# Patient Record
Sex: Male | Born: 1935 | Race: White | Hispanic: No | Marital: Married | Smoking: Former smoker
Health system: Southern US, Community
[De-identification: ages and names within clinical notes are randomized; demographics above are authoritative.]

## PROBLEM LIST (undated history)

## (undated) DIAGNOSIS — M810 Age-related osteoporosis without current pathological fracture: Secondary | ICD-10-CM

## (undated) DIAGNOSIS — I739 Peripheral vascular disease, unspecified: Secondary | ICD-10-CM

## (undated) DIAGNOSIS — K8020 Calculus of gallbladder without cholecystitis without obstruction: Secondary | ICD-10-CM

## (undated) DIAGNOSIS — J189 Pneumonia, unspecified organism: Secondary | ICD-10-CM

## (undated) DIAGNOSIS — I10 Essential (primary) hypertension: Secondary | ICD-10-CM

## (undated) DIAGNOSIS — C329 Malignant neoplasm of larynx, unspecified: Secondary | ICD-10-CM

## (undated) DIAGNOSIS — K55069 Acute infarction of intestine, part and extent unspecified: Secondary | ICD-10-CM

## (undated) DIAGNOSIS — E7800 Pure hypercholesterolemia, unspecified: Secondary | ICD-10-CM

## (undated) DIAGNOSIS — G2581 Restless legs syndrome: Secondary | ICD-10-CM

## (undated) DIAGNOSIS — G454 Transient global amnesia: Secondary | ICD-10-CM

## (undated) DIAGNOSIS — C189 Malignant neoplasm of colon, unspecified: Secondary | ICD-10-CM

## (undated) DIAGNOSIS — IMO0001 Reserved for inherently not codable concepts without codable children: Secondary | ICD-10-CM

## (undated) DIAGNOSIS — M1990 Unspecified osteoarthritis, unspecified site: Secondary | ICD-10-CM

## (undated) DIAGNOSIS — K219 Gastro-esophageal reflux disease without esophagitis: Secondary | ICD-10-CM

## (undated) DIAGNOSIS — B029 Zoster without complications: Secondary | ICD-10-CM

## (undated) DIAGNOSIS — H409 Unspecified glaucoma: Secondary | ICD-10-CM

## (undated) DIAGNOSIS — D539 Nutritional anemia, unspecified: Secondary | ICD-10-CM

## (undated) DIAGNOSIS — I2510 Atherosclerotic heart disease of native coronary artery without angina pectoris: Secondary | ICD-10-CM

## (undated) DIAGNOSIS — F0390 Unspecified dementia without behavioral disturbance: Secondary | ICD-10-CM

## (undated) DIAGNOSIS — I639 Cerebral infarction, unspecified: Secondary | ICD-10-CM

## (undated) DIAGNOSIS — C4490 Unspecified malignant neoplasm of skin, unspecified: Secondary | ICD-10-CM

## (undated) DIAGNOSIS — H9190 Unspecified hearing loss, unspecified ear: Secondary | ICD-10-CM

## (undated) HISTORY — DX: Pneumonia, unspecified organism: J18.9

## (undated) HISTORY — PX: EXPLORATORY LAPAROTOMY: SUR591

## (undated) HISTORY — DX: Atherosclerotic heart disease of native coronary artery without angina pectoris: I25.10

## (undated) HISTORY — DX: Malignant neoplasm of larynx, unspecified: C32.9

## (undated) HISTORY — PX: COLECTOMY: SHX59

## (undated) HISTORY — DX: Gastro-esophageal reflux disease without esophagitis: K21.9

## (undated) HISTORY — PX: OTHER SURGICAL HISTORY: SHX169

## (undated) HISTORY — DX: Unspecified glaucoma: H40.9

## (undated) HISTORY — DX: Transient global amnesia: G45.4

## (undated) HISTORY — PX: BACK SURGERY: SHX140

## (undated) HISTORY — DX: Peripheral vascular disease, unspecified: I73.9

## (undated) HISTORY — DX: Pure hypercholesterolemia, unspecified: E78.00

## (undated) HISTORY — DX: Cerebral infarction, unspecified: I63.9

## (undated) HISTORY — DX: Zoster without complications: B02.9

## (undated) HISTORY — DX: Age-related osteoporosis without current pathological fracture: M81.0

## (undated) HISTORY — DX: Calculus of gallbladder without cholecystitis without obstruction: K80.20

## (undated) HISTORY — DX: Unspecified hearing loss, unspecified ear: H91.90

## (undated) HISTORY — DX: Acute infarction of intestine, part and extent unspecified: K55.069

## (undated) HISTORY — DX: Nutritional anemia, unspecified: D53.9

## (undated) HISTORY — DX: Malignant neoplasm of colon, unspecified: C18.9

## (undated) HISTORY — DX: Essential (primary) hypertension: I10

## (undated) HISTORY — PX: EYE SURGERY: SHX253

---

## 1998-02-28 ENCOUNTER — Ambulatory Visit: Admission: RE | Admit: 1998-02-28 | Discharge: 1998-02-28 | Payer: Self-pay | Admitting: Vascular Surgery

## 1998-04-11 ENCOUNTER — Ambulatory Visit: Admission: RE | Admit: 1998-04-11 | Discharge: 1998-04-11 | Payer: Self-pay | Admitting: Vascular Surgery

## 1999-02-18 ENCOUNTER — Ambulatory Visit (HOSPITAL_COMMUNITY): Admission: RE | Admit: 1999-02-18 | Discharge: 1999-02-18 | Payer: Self-pay | Admitting: Neurosurgery

## 1999-02-18 ENCOUNTER — Encounter: Payer: Self-pay | Admitting: Neurosurgery

## 1999-03-01 ENCOUNTER — Emergency Department (HOSPITAL_COMMUNITY): Admission: EM | Admit: 1999-03-01 | Discharge: 1999-03-01 | Payer: Self-pay | Admitting: Internal Medicine

## 1999-03-10 ENCOUNTER — Encounter: Payer: Self-pay | Admitting: Neurosurgery

## 1999-03-10 ENCOUNTER — Ambulatory Visit (HOSPITAL_COMMUNITY): Admission: RE | Admit: 1999-03-10 | Discharge: 1999-03-10 | Payer: Self-pay | Admitting: Neurosurgery

## 1999-03-25 ENCOUNTER — Ambulatory Visit (HOSPITAL_COMMUNITY): Admission: RE | Admit: 1999-03-25 | Discharge: 1999-03-25 | Payer: Self-pay | Admitting: Neurosurgery

## 1999-03-25 ENCOUNTER — Encounter: Payer: Self-pay | Admitting: Neurosurgery

## 1999-04-08 ENCOUNTER — Ambulatory Visit (HOSPITAL_COMMUNITY): Admission: RE | Admit: 1999-04-08 | Discharge: 1999-04-08 | Payer: Self-pay | Admitting: Neurosurgery

## 1999-04-08 ENCOUNTER — Encounter: Payer: Self-pay | Admitting: Neurosurgery

## 1999-07-23 ENCOUNTER — Encounter: Payer: Self-pay | Admitting: Neurosurgery

## 1999-07-23 ENCOUNTER — Ambulatory Visit (HOSPITAL_COMMUNITY): Admission: RE | Admit: 1999-07-23 | Discharge: 1999-07-23 | Payer: Self-pay | Admitting: Neurosurgery

## 2000-06-30 ENCOUNTER — Encounter: Admission: RE | Admit: 2000-06-30 | Discharge: 2000-06-30 | Payer: Self-pay | Admitting: Geriatric Medicine

## 2000-06-30 ENCOUNTER — Encounter: Payer: Self-pay | Admitting: Geriatric Medicine

## 2001-03-23 ENCOUNTER — Encounter: Payer: Self-pay | Admitting: Geriatric Medicine

## 2001-03-23 ENCOUNTER — Encounter: Admission: RE | Admit: 2001-03-23 | Discharge: 2001-03-23 | Payer: Self-pay | Admitting: Geriatric Medicine

## 2001-04-01 ENCOUNTER — Encounter: Admission: RE | Admit: 2001-04-01 | Discharge: 2001-04-01 | Payer: Self-pay | Admitting: Geriatric Medicine

## 2001-04-01 ENCOUNTER — Encounter: Payer: Self-pay | Admitting: Geriatric Medicine

## 2001-04-12 ENCOUNTER — Encounter: Payer: Self-pay | Admitting: Neurosurgery

## 2001-04-12 ENCOUNTER — Inpatient Hospital Stay (HOSPITAL_COMMUNITY): Admission: RE | Admit: 2001-04-12 | Discharge: 2001-04-13 | Payer: Self-pay | Admitting: Neurosurgery

## 2001-11-09 ENCOUNTER — Ambulatory Visit (HOSPITAL_COMMUNITY): Admission: RE | Admit: 2001-11-09 | Discharge: 2001-11-09 | Payer: Self-pay | Admitting: Geriatric Medicine

## 2002-03-21 ENCOUNTER — Ambulatory Visit (HOSPITAL_COMMUNITY): Admission: RE | Admit: 2002-03-21 | Discharge: 2002-03-21 | Payer: Self-pay | Admitting: Gastroenterology

## 2002-03-21 ENCOUNTER — Encounter (INDEPENDENT_AMBULATORY_CARE_PROVIDER_SITE_OTHER): Payer: Self-pay | Admitting: Specialist

## 2002-04-05 ENCOUNTER — Encounter (INDEPENDENT_AMBULATORY_CARE_PROVIDER_SITE_OTHER): Payer: Self-pay | Admitting: Specialist

## 2002-04-05 ENCOUNTER — Inpatient Hospital Stay (HOSPITAL_COMMUNITY): Admission: RE | Admit: 2002-04-05 | Discharge: 2002-04-10 | Payer: Self-pay | Admitting: General Surgery

## 2002-04-13 ENCOUNTER — Encounter: Payer: Self-pay | Admitting: Otolaryngology

## 2002-04-13 ENCOUNTER — Inpatient Hospital Stay (HOSPITAL_COMMUNITY): Admission: EM | Admit: 2002-04-13 | Discharge: 2002-04-16 | Payer: Self-pay | Admitting: Emergency Medicine

## 2002-04-17 ENCOUNTER — Encounter: Payer: Self-pay | Admitting: General Surgery

## 2002-04-17 ENCOUNTER — Inpatient Hospital Stay (HOSPITAL_COMMUNITY): Admission: AD | Admit: 2002-04-17 | Discharge: 2002-04-25 | Payer: Self-pay | Admitting: General Surgery

## 2002-12-07 ENCOUNTER — Ambulatory Visit (HOSPITAL_COMMUNITY): Admission: RE | Admit: 2002-12-07 | Discharge: 2002-12-07 | Payer: Self-pay | Admitting: Geriatric Medicine

## 2003-04-24 ENCOUNTER — Ambulatory Visit (HOSPITAL_COMMUNITY): Admission: RE | Admit: 2003-04-24 | Discharge: 2003-04-24 | Payer: Self-pay | Admitting: Gastroenterology

## 2003-04-24 ENCOUNTER — Encounter (INDEPENDENT_AMBULATORY_CARE_PROVIDER_SITE_OTHER): Payer: Self-pay | Admitting: Specialist

## 2003-07-20 ENCOUNTER — Encounter: Admission: RE | Admit: 2003-07-20 | Discharge: 2003-07-20 | Payer: Self-pay | Admitting: Geriatric Medicine

## 2003-07-20 ENCOUNTER — Encounter: Payer: Self-pay | Admitting: Geriatric Medicine

## 2006-04-07 ENCOUNTER — Encounter: Admission: RE | Admit: 2006-04-07 | Discharge: 2006-04-07 | Payer: Self-pay | Admitting: Geriatric Medicine

## 2006-04-12 ENCOUNTER — Encounter: Admission: RE | Admit: 2006-04-12 | Discharge: 2006-04-12 | Payer: Self-pay | Admitting: Geriatric Medicine

## 2006-04-16 ENCOUNTER — Encounter: Admission: RE | Admit: 2006-04-16 | Discharge: 2006-04-16 | Payer: Self-pay | Admitting: Geriatric Medicine

## 2007-09-29 DIAGNOSIS — B029 Zoster without complications: Secondary | ICD-10-CM

## 2007-09-29 HISTORY — DX: Zoster without complications: B02.9

## 2009-02-17 ENCOUNTER — Inpatient Hospital Stay (HOSPITAL_COMMUNITY): Admission: EM | Admit: 2009-02-17 | Discharge: 2009-02-19 | Payer: Self-pay | Admitting: Emergency Medicine

## 2009-02-18 ENCOUNTER — Encounter (INDEPENDENT_AMBULATORY_CARE_PROVIDER_SITE_OTHER): Payer: Self-pay | Admitting: Internal Medicine

## 2009-02-19 ENCOUNTER — Encounter (INDEPENDENT_AMBULATORY_CARE_PROVIDER_SITE_OTHER): Payer: Self-pay | Admitting: Gastroenterology

## 2009-03-04 ENCOUNTER — Inpatient Hospital Stay (HOSPITAL_BASED_OUTPATIENT_CLINIC_OR_DEPARTMENT_OTHER): Admission: RE | Admit: 2009-03-04 | Discharge: 2009-03-04 | Payer: Self-pay | Admitting: Interventional Cardiology

## 2011-01-06 LAB — CARDIAC PANEL(CRET KIN+CKTOT+MB+TROPI)
CK, MB: 1.9 ng/mL (ref 0.3–4.0)
CK, MB: 2 ng/mL (ref 0.3–4.0)
Relative Index: INVALID (ref 0.0–2.5)
Total CK: 62 U/L (ref 7–232)

## 2011-01-06 LAB — DIFFERENTIAL
Basophils Absolute: 0 K/uL (ref 0.0–0.1)
Basophils Relative: 0 % (ref 0–1)
Eosinophils Absolute: 0.1 K/uL (ref 0.0–0.7)
Eosinophils Relative: 1 % (ref 0–5)
Lymphocytes Relative: 15 % (ref 12–46)
Lymphs Abs: 0.8 K/uL (ref 0.7–4.0)
Monocytes Absolute: 0.1 K/uL (ref 0.1–1.0)
Monocytes Relative: 2 % — ABNORMAL LOW (ref 3–12)
Neutro Abs: 4.5 K/uL (ref 1.7–7.7)
Neutrophils Relative %: 82 % — ABNORMAL HIGH (ref 43–77)

## 2011-01-06 LAB — COMPREHENSIVE METABOLIC PANEL
ALT: 29 U/L (ref 0–53)
AST: 29 U/L (ref 0–37)
Albumin: 3.5 g/dL (ref 3.5–5.2)
Alkaline Phosphatase: 64 U/L (ref 39–117)
Glucose, Bld: 100 mg/dL — ABNORMAL HIGH (ref 70–99)
Potassium: 3.8 mEq/L (ref 3.5–5.1)
Sodium: 141 mEq/L (ref 135–145)
Total Protein: 5.9 g/dL — ABNORMAL LOW (ref 6.0–8.3)

## 2011-01-06 LAB — CBC
HCT: 35.4 % — ABNORMAL LOW (ref 39.0–52.0)
Hemoglobin: 12.3 g/dL — ABNORMAL LOW (ref 13.0–17.0)
Hemoglobin: 14.2 g/dL (ref 13.0–17.0)
MCHC: 34.3 g/dL (ref 30.0–36.0)
MCHC: 34.8 g/dL (ref 30.0–36.0)
MCV: 106.3 fL — ABNORMAL HIGH (ref 78.0–100.0)
MCV: 107.1 fL — ABNORMAL HIGH (ref 78.0–100.0)
Platelets: 176 K/uL (ref 150–400)
Platelets: 186 10*3/uL (ref 150–400)
Platelets: 206 10*3/uL (ref 150–400)
RBC: 3.33 MIL/uL — ABNORMAL LOW (ref 4.22–5.81)
RDW: 14.4 % (ref 11.5–15.5)
RDW: 14.4 % (ref 11.5–15.5)
WBC: 4 K/uL (ref 4.0–10.5)

## 2011-01-06 LAB — URINALYSIS, MICROSCOPIC ONLY
Bilirubin Urine: NEGATIVE
Nitrite: NEGATIVE
Protein, ur: NEGATIVE mg/dL
Specific Gravity, Urine: 1.01 (ref 1.005–1.030)
Urobilinogen, UA: 0.2 mg/dL (ref 0.0–1.0)

## 2011-01-06 LAB — BASIC METABOLIC PANEL WITH GFR
BUN: 13 mg/dL (ref 6–23)
CO2: 31 meq/L (ref 19–32)
Calcium: 8.8 mg/dL (ref 8.4–10.5)
Chloride: 106 meq/L (ref 96–112)
Creatinine, Ser: 0.76 mg/dL (ref 0.4–1.5)
GFR calc non Af Amer: >60 mL/min
Glucose, Bld: 106 mg/dL — ABNORMAL HIGH (ref 70–99)
Potassium: 3.4 meq/L — ABNORMAL LOW (ref 3.5–5.1)
Sodium: 143 meq/L (ref 135–145)

## 2011-01-06 LAB — CK TOTAL AND CKMB (NOT AT ARMC)
CK, MB: 2.6 ng/mL (ref 0.3–4.0)
Relative Index: INVALID (ref 0.0–2.5)
Total CK: 58 U/L (ref 7–232)

## 2011-01-06 LAB — POCT CARDIAC MARKERS
CKMB, poc: 1.3 ng/mL (ref 1.0–8.0)
CKMB, poc: 1.5 ng/mL (ref 1.0–8.0)
Myoglobin, poc: 99.5 ng/mL (ref 12–200)
Troponin i, poc: <0.05 ng/mL (ref 0.00–0.09)
Troponin i, poc: <0.05 ng/mL (ref 0.00–0.09)

## 2011-01-06 LAB — LIPID PANEL
HDL: 54 mg/dL
Total CHOL/HDL Ratio: 3.2 ratio
Triglycerides: 99 mg/dL
VLDL: 20 mg/dL (ref 0–40)

## 2011-01-06 LAB — MAGNESIUM: Magnesium: 2.6 mg/dL — ABNORMAL HIGH (ref 1.5–2.5)

## 2011-01-06 LAB — BASIC METABOLIC PANEL
CO2: 27 mEq/L (ref 19–32)
Chloride: 108 mEq/L (ref 96–112)
Creatinine, Ser: 0.78 mg/dL (ref 0.4–1.5)
GFR calc Af Amer: >60 mL/min (ref >60)
Potassium: 3.9 mEq/L (ref 3.5–5.1)
Sodium: 142 mEq/L (ref 135–145)

## 2011-01-06 LAB — BRAIN NATRIURETIC PEPTIDE: Pro B Natriuretic peptide (BNP): 33 pg/mL (ref 0.0–100.0)

## 2011-01-06 LAB — TSH: TSH: 2.987 u[IU]/mL (ref 0.350–4.500)

## 2011-01-06 LAB — URINE CULTURE

## 2011-01-06 LAB — TROPONIN I

## 2011-02-10 NOTE — Discharge Summary (Signed)
NAME:  Eric [name], Eric [name] NO.:  [id number]   MEDICAL RECORD NO.:  [id number]          PATIENT TYPE:  INP   LOCATION:  3741                         FACILITY:  [facility]   PHYSICIAN:  Ramiro Harvest, MD    DATE OF BIRTH:  11/27/1929   DATE OF ADMISSION:  02/17/2009  DATE OF DISCHARGE:  02/19/2009                               DISCHARGE SUMMARY   PRIMARY CARE PHYSICIAN:  Hal T. Pete Glatter, M.D. of [place].   DISCHARGE DIAGNOSES:  1. Chest pain.  2. Esophagitis.  3. Dilated common bile duct  4. Gastroesophageal reflux disease.  5. Hypokalemia.  6. Peripheral vascular disease.  7. History of a transient ischemic attack.  8. Hypertension.  9. Hyperlipidemia.  10.Status post femoral artery stenting per Dr. Arbie Cookey secondary to      claudication.  11.Status post lumbar laminectomies x2, one L5-S1 in 1997, the other      one, L2-L3 of herniated disk per Dr. Channing Mutters in July 2002.  12.Benign prostatic hypertrophy.  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  14.History of malrotation.  15.History of exploratory laparotomy 30 years ago secondary to twisted      intestines per patient.  16.History of colonic polyps status post polypectomy July 2004.  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   DISCHARGE MEDICATIONS:  1. Omeprazole 40 mg p.o. b.i.d.  2. HCTZ 25 mg p.o. daily.  3. Flomax 0.4 mg p.o. daily.  4. Zocor 20 mg p.o. daily.  5. Aspirin 81 mg p.o. daily.  6. Lisinopril 5 mg p.o. daily.  7. Halcion 0.125 mg p.o. q.h.s.  8. Fish oil 1000 mg p.o. daily.  9. Calcium 600 plus vitamin D b.i.d.   DISPOSITION:  The patient will be discharged home.   FOLLOW UP:  1. The patient is scheduled to follow up with Dr. Katrinka Blazing on Feb 20, 2009 at 8:30 a.m. for a stress Cardiolite.  2. The patient is also to call to schedule a follow up appointment      with Dr.  Danise Edge in 1 week to follow up on biopsies of the      esophagus and the duodenum.  The patient also will be determined by      Dr. Laural Benes as to the need for a nuclear medicine gastric emptying      study as an outpatient to rule out gastroparesis.  3. The patient needs to follow up with PCP in the next 1-2 weeks on      follow up of basic metabolic profile needs to be checked to follow      up on the patient's electrolytes and renal function.   CONSULTATIONS:  1. A cardiology consult was done.  The patient was seen in      consultation by Dr. Verdis Prime on Feb 18, 2009.  2. A GI consult was also done.  The patient was seen by Dr. Daphine Deutscher  Johnson on Feb 18, 2009.   PROCEDURES PERFORMED:  1. A chest x-ray was done on Feb 17, 2009 that showed no acute      cardiopulmonary process.  2. Abdominal ultrasound was done on Feb 18, 2009 that showed      cholelithiasis, but no sonographic findings for acute      cholecystitis, dilated common bile duct measuring up to 13 mm in      size.  ERCP or MRCP may be helpful for further evaluation of this      finding.  Poor visualization of the pancreas, bilateral parapelvic      renal cyst.  3. Barium swallow with pill was done on Feb 18, 2009 that showed a      small hiatal hernia, gastroesophageal reflux disease.  No      stricture, query esophagitis.  4. MRCP was done on Feb 18, 2009 that showed diffuse biliary ductal      dilatation with common bile duct measuring 13 mm.  No evidence of      obstructing biliary calculi, stricture or mass.  Consider ERCP to      assess for ampullary stenosis if clinically warranted, small      gallstones, no evidence for acute cholecystitis, bilateral benign      adrenal adenomas incidentally noted.  5. A 2-D echo was done on Feb 18, 2009 which showed normal left      ventricular size, systolic function was normal with EF of 55-60%,      mild mitral valvular regurgitation.  6. An EGD was performed on  Feb 19, 2009 which showed a distal severe      circumferential esophagitis.  Also showed a large hiatal hernia as      well and a probable delayed gastric emptying study as well as      questionable flattened villi in the duodenum.  Biopsies were taken      to rule out celiac disease as well.   ADMISSION HISTORY/PHYSICAL:  Eric [name] is a 77 year old gentleman  with history of hypertension, hyperlipidemia, prior history of tobacco  abuse, peripheral vascular disease, history of TIA, GERD,  mesenteric  vein thrombosis in the past status post right hemicolectomy for  dysplastic polyp, presented to the ED with a 2 week history of worsening  intermittent chest pain on exertion.  The patient stated that he woke up  on the morning of admission with midsternal pressure-like nonradiating  chest pain which was constant in nature with associated nausea and some  generalized weakness as well.  The patient denied any diaphoresis.  No  shortness of breath, no palpitations, no emesis, no burning sensation,  no fever, no chills, no melena, no hematemesis, no hematochezia, no  focal neurological symptoms.  The patient denied any cough, no dysuria,  no syncope.  The patient presented to the ED and was given 4 baby  aspirins with significant improvement in his chest pain.  In the ED,  chest x-ray obtained was within normal limits.  Point of care cardiac  markers were negative x2.  Lipase was within normal limits.  Comprehensive metabolic profile with a protein of 5.9, otherwise was  within normal limits.  Coagulation factors were within normal limits.  CBC with a white count of 5.5, hemoglobin 14.2, hematocrit 40.8.  We  were called to admit the patient for further evaluation and management.  EKG with a normal sinus rhythm.   PHYSICAL EXAMINATION:  VITAL SIGNS:  Temperature of  98.7, blood pressure  144/65, pulse of 78, respirations 20, satting 96% on room air.  GENERAL:  The patient is in no apparent  distress.  HEENT:  Normocephalic, atraumatic.  Pupils equal, round and reactive to  light and accommodation.  Extraocular movements intact.  Oropharynx is  clear.  No lesions.  No exudates.  NECK:  Supple.  No lymphadenopathy.  RESPIRATORY:  Lungs are clear to auscultation bilaterally.  No wheezes,  no crackles, no rhonchi.  CARDIOVASCULAR:  Regular rate and rhythm.  No murmurs, rubs or gallops.  ABDOMEN:  Soft, nontender, nondistended, positive bowel sounds.  EXTREMITIES:  No clubbing, cyanosis or edema.  NEUROLOGIC:  The patient was alert and oriented x3.  Cranial nerves II-  XII were grossly intact.  No focal deficits.   LABORATORY DATA:  Point of care cardiac markers:  CK-MB 1.5-1.3,  troponin-I less than 0.05 to less than 0.05, myoglobin of 91.6-99.5.  Lipase of 24, sodium 141, potassium 3.8, chloride 104, bicarb 30, BUN  14, creatinine 0.77, glucose of 100, bilirubin 0.7, alk phosphatase 64,  AST 29, ALT 29, albumin 3.5, protein 5.9, calcium of 9.2, PT of 13.0,  INR of 1.0.  CBC with a white count of 5.5, hemoglobin 14.2, platelets  206, hematocrit 40.8, ANC of 4.5.  Labs on day of discharge:  Sodium 142, potassium 3.9, chloride 108,  bicarb 27, BUN 13, creatinine 0.78, glucose 111, calcium of 8.7.  CBC  with a white count of 4.4, hemoglobin 12.8, platelets of 186, hematocrit  of 37.5.   DIAGNOSTICS:  1. EKG as stated above.  2. Chest x-ray as stated above.   HOSPITAL COURSE:  1. Chest pain.  The patient was admitted with chest pain.  The patient      did have multiple cardiac risk factors including hypertension,      prior tobacco use, mesenteric vein thrombosis, hyperlipidemia, TIA,      peripheral vascular disease.  Cardiac enzymes were cycled q.8 h. x3      which came back negative.  A BNP was also obtained which was within      normal limits.  TSH was within normal limits.  UA was negative.  D-      dimer was negative.  A 2-D echo was obtained with results as stated       above which was negative for any LV dysfunction.  The patient was      placed on oxygen, morphine, aspirin, Zocor, fish oil, nitroglycerin      and Protonix.  Cardiology was consulted secondary to the patient's      multiple risk factors.  The patient was seen in consultation.  It      was felt the patient had two separate chest pain issues, one that      seemed like a burning sensation that was likely GI related and the      other one likely acute coronary syndrome related.  The patient      remained stable, did not have any further midsternal or substernal      chest pain and was seen by cardiology.  It was felt that the      patient was stable for discharge with close followup.  The patient      was scheduled for a stress Cardiolite to be done on Feb 20, 2009      with Dr. Katrinka Blazing at 8:30 a.m. The patient will be discharged in      stable and  improved condition.  2. Esophagitis.  The patient was noted to have a second component of      chest pain which was felt to be more a GI related.  A barium      swallow with a pill was also obtained with results as stated above.      Barium swallow study came back with a small hiatal hernia, question      of esophagitis.  A right upper quadrant abdominal ultrasound was      also done which showed cholelithiasis as well as a dilated common      bile duct measuring up to 13 mm in size, as such a GI consult was      done.  The patient was seen in consultation by Dr. Danise Edge      of [facility] on Feb 18, 2009.  It was      felt that patient will benefit from an EGD.  An EGD was done on Feb 19, 2009 and biopsies of the esophagus and duodenum were obtained.      EGD was notable for a distal severe circumferential esophagitis.      It was recommended that the patient be placed on PPI twice daily      and to follow up with GI as an outpatient for results of his      biopsies as well as possible outpatient gastric  emptying study.      The patient remained in stable condition and will be discharged in      stable and improved condition.  3. Dilated common bile duct, please see above.  4. Hypokalemia.  The patient was noted to be hypokalemic during the      hospitalization.  His potassium was repleted and had resolved by      day of discharge.  5. The rest of the patient's chronic medical issues were stable      throughout the hospitalization and the patient will be discharged      in stable and improved condition.   On day of discharge vital signs:  Temperature 98.4, pulse of 84, blood  pressure 157/74, respirations 18, satting 98% on room air.      Ramiro Harvest, MD  Electronically Signed     DT/MEDQ  D:  02/19/2009  T:  02/19/2009  Job:  [id number]   cc:   Hal T. Stoneking, M.D.  Danise Edge, M.D.  Lyn Records, M.D.

## 2011-02-10 NOTE — Cardiovascular Report (Signed)
NAME:  Eric [name], Eric [name] NO.:  [id number]   MEDICAL RECORD NO.:  [id number]          PATIENT TYPE:  OIB   LOCATION:  1965                         FACILITY:  [facility]   PHYSICIAN:  Lyn Records, M.D.   DATE OF BIRTH:  01-10-1930   DATE OF PROCEDURE:  03/04/2009  DATE OF DISCHARGE:  03/04/2009                            CARDIAC CATHETERIZATION   INDICATION:  The patient was admitted with a prolonged episode of chest  tightness that occurred approximately 2 weeks ago.  A subsequent nuclear  myocardial perfusion study raised the distinct suspicion that there was  inferolateral and anteroapical ischemia.  This study is being done to  define anatomy and help guide therapy.   PROCEDURES PERFORMED:  1. Left heart cath.  2. Selective coronary angio.  3. Left ventriculography.   DESCRIPTION:  After informed consent, a 4-French sheath was placed in  the right femoral artery using the modified Seldinger technique.  A 4-  [place] dilator was used over the [employer].  The Glidewire was used  because there was difficulty tracking up the femoral and iliac using a J-  wire.  After the dilator was successfully placed over the Glidewire  which had reached the descending aorta, brisk blood return was noted  when the Glidewire was removed.  We then replaced the Glidewire with a J-  wire and removed the dilator, loaded it with a sheath, and placed the  sheath in the right femoral.  We then performed left heart  catheterization using an A2 4-French multipurpose catheter for  hemodynamic recordings and left ventriculography.  A #4 4-French left  Judkins catheter and a #4 4-French right Judkins catheter were used for  left and right coronary angiography.  The patient tolerated the  procedure without complications.   RESULTS:  1. Hemodynamic data:      a.     Aortic pressure 137/12.      b.     Left ventricular pressure 137/56.  2. Left ventriculography:  The left ventricular cavity  size is normal.      The left ventricular wall motion is normal.  EF is 55%.  No MR.  3. Coronary angiography:      a.     The left main coronary artery is patent and contains mild       calcification.      b.     Left anterior descending coronary:  LAD stops just short of       the left ventricular apex.  The vessel gives origin to 2 diagonal       branches.  The first diagonal arises in the mid LAD and bifurcates       on the left lateral wall.  There is 30% proximal narrowing in this       diagonal.  The second diagonal arises at the beginning of the       transition into the distal LAD, is small and bifurcates as well.       No significant obstructive lesions noted in the LAD diagonal  system.      c.     Circumflex artery:  The circumflex coronary artery is large,       giving origin to 1 obtuse marginal distally.  There is a smaller       first obtuse marginal that bifurcates early.      d.     Right coronary:  The right coronary artery is dominant.       There is a large PDA that wraps around left ventricular apex.  Two       left ventricle branches are noted that arise from the continuation       of the right coronary.  Ostium of the right coronary is heavily       calcified, but no obstructive lesions are noted in the right.   CONCLUSIONS:  1. Widely patent coronary arteries without evidence of significant      obstructive disease.  2. Normal LV function.  3. Calcified aorta and right coronary ostium.  4. The perfusion abnormalities noted on the Cardiolite represent false      positive findings.      Lyn Records, M.D.  Electronically Signed     HWS/MEDQ  D:  03/04/2009  T:  03/05/2009  Job:  [id number]

## 2011-02-10 NOTE — Op Note (Signed)
NAME:  Eric [name], Eric [name] NO.:  [id number]   MEDICAL RECORD NO.:  [id number]          PATIENT TYPE:  INP   LOCATION:  3741                         FACILITY:  [facility]   PHYSICIAN:  Danise Edge, M.D.   DATE OF BIRTH:  1930-03-22   DATE OF PROCEDURE:  02/19/2009  DATE OF DISCHARGE:  02/19/2009                               OPERATIVE REPORT   HISTORY:  Mr. Eric [name] is a 77 year old male born, 1930/04/13.  The  patient was admitted to [facility] on Feb 17, 2009, to evaluate  mid retrosternal pressure-like anterior chest pain and burning  epigastric discomfort, unassociated with odynophagia.  He underwent an  abdominal ultrasound, which showed a slightly dilated extrahepatic  biliary ductal system along with gallstones.  His hepatic profile was  normal.  He takes Nexium.  He has chronic gastroesophageal reflux.  Esophagogastroduodenoscopy is performed to assess his burning epigastric  discomfort.   MEDICATION ALLERGIES:  None.   PAST MEDICAL AND SURGICAL HISTORY:  1. Hypertension.  2. Hyperlipidemia.  3. Peripheral vascular disease.  4. Femoral artery stenting for claudication.  5. Multiple laminectomies.  6. Transient ischemic attack.  7. Gastroesophageal reflux disease.  8. Benign prostatic hypertrophy.  9. Mesenteric vein thrombosis, July 2003.  10.Exploratory laparotomy 30 years ago to treat twisted intestines.  11.History of colon polyps.  12.Right segmental colectomy to remove a tubulovillous adenoma of the      right colon, unassociated with invasive cancer.   CHRONIC MEDICATIONS:  Hydrochlorothiazide, Nexium, Zocor, multivitamin,  Flomax, fish oil, calcium, and aspirin.   HABITS:  The patient quit smoking cigarettes in 1998.  He does not  consume alcohol.   ENDOSCOPIST:  Danise Edge, MD   PREMEDICATION:  Fentanyl 75 mcg and Versed 6 mg.   PROCEDURE:  After obtaining an informed consent, the patient was placed  in the left lateral  decubitus position.  I administered intravenous  fentanyl and intravenous Versed to achieve conscious sedation for the  procedure.  The patient's blood pressure, oxygen saturation, and cardiac  rhythm were monitored throughout the procedure and documented in the  medical record.   The Pentax gastroscope was passed through the posterior hypopharynx into  the proximal esophagus without difficulty.  The hypopharynx, larynx, and  vocal cords appeared normal.   Esophagoscopy:  The proximal and mid segments of the esophagus appeared  normal.  There is severe circumferential erosive esophagitis in the  distal esophagus.  I cannot rule out Barrett esophagus and biopsies were  performed.  The esophagogastric junction is noted at approximately 40 cm  from the incisor teeth.   Gastroscopy:  There is a large hiatal hernia.  Retroflex view of the  gastric cardia reveals a patulous diaphragmatic hiatus.  There is a  large amount of retained liquid and solid food in the gastric fundus and  the greater curvature of the gastric body.  I was unable to evacuate the  material with the gastroscope and was not able to evaluate the mucosa in  this area.  The gastric antrum appears normal.  Pylorus  is patent and  there is no signs of mechanical gastric outlet obstruction.   Duodenoscopy:  The duodenal bulb and descending duodenum appear normal.  The mucosa has somewhat of an appearance to suggest paucity in duodenal  villi and biopsies were performed to look for signs of celiac disease.   ASSESSMENT:  1. Gastroesophageal reflux disease associated with a large hiatal      hernia and severe erosive esophagitis involving the distal      esophagus with biopsies pending to rule out Barrett esophagus and      mucosal dysplasia.  2. Large amount of retained liquid and solid gastric material      suggesting delayed gastric emptying.  No signs of mechanical      gastric outlet obstruction.  3. Biopsies performed  from the proximal small intestine to rule out      celiac disease.           ______________________________  Danise Edge, M.D.     MJ/MEDQ  D:  02/19/2009  T:  02/19/2009  Job:  [id number]   cc:   Hal T. Stoneking, M.D.

## 2011-02-10 NOTE — H&P (Signed)
NAME:  [name], [name] NO.:  [id number]   MEDICAL RECORD NO.:  [id number]          PATIENT TYPE:  INP   LOCATION:  3741                         FACILITY:  [facility]   PHYSICIAN:  Ramiro Harvest, MD    DATE OF BIRTH:  1930-07-08   DATE OF ADMISSION:  02/17/2009  DATE OF DISCHARGE:                              HISTORY & PHYSICAL   PRIMARY CARE PHYSICIAN:  Hal T. Pete Glatter, M.D. of [place], and Sigmund I.  Patsi Sears, M.D.   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  The patient stated that he woke up  on the morning of admission with midsternal, pressure-like, nonradiating  chest pain, constant in nature with associated nausea and some  generalized weakness as well.  The patient denies any diaphoresis, no  shortness of breath, no palpitations, no emesis, no burning sensation,  no fever, no chills, no melena, no hematemesis, no hematochezia.  No  focal neurological symptoms.  No cough, no dysuria, no syncope.  The  patient presented to the ED, was given 4 baby aspirins with significant  improvement in his chest pain.  In the ED, chest x-ray obtained was  within normal limits.  Point of care cardiac markers were negative x2.  Lipase was within normal limits.  Comprehensive metabolic profile with a  protein of 5.9; otherwise, was within normal limits.  Coagulation  factors were within normal limits.  CBC with a white count of 5.5,  hemoglobin 14.2, hematocrit 40.8.  We were called to admit the patient  for further evaluation and management.  EKG did show a normal sinus  rhythm.   ALLERGIES:  NO KNOWN DRUG ALLERGIES.   PAST  MEDICAL HISTORY:  1. Hypertension.  2. Hyperlipidemia.  3. Peripheral vascular disease.  4. Status post femoral artery stenting per Dr. Arbie Cookey secondary to      claudication.  5. Status post lumbar laminectomies x2, one to L5-S1 in 1997 and      another one L2-L3 of a herniated disk per Dr. Channing Mutters July 2002.  6. History of TIA.  7. Gastroesophageal reflux disease.  8. BPH.  9. 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  Was on Coumadin      and is currently off Coumadin at this time.  10.History of malrotation.  11.History of exploratory laparotomy 30 years ago secondary to twisted      intestines, per the patient.  12.History of colonic polyps status post polypectomy July 2004.  13.Status post right hemicolectomy April 05, 2002 for a dysplastic      polyp.   HOME MEDICATIONS:  1. HCTZ dose unknown.  2. Nexium 40 mg p.o. daily.  3. Zocor dose unknown.  4. Multivitamin p.o. daily.  5. Flomax 0.4 mg p.o. daily.  6. Fish  oil 1000 mg p.o. daily.  7. Calcium 600 plus vitamin D b.i.d.  8. Aspirin 81 mg p.o. daily.   SOCIAL HISTORY:  The patient is a former tobacco user, quit in 1998.  No  alcohol use.  No IV drug use.  The patient is married, is a retired  Visual merchandiser, has 1 daughter who is healthy.   FAMILY HISTORY:  Father deceased age 29 from a CVA.  Mother deceased age  59 from hypertension, diabetes and status post cholecystectomy with  sepsis.  Brother alive age 58, history of coronary artery disease with  acute MI in his 38s.  Brother deceased age 45 from cancer.  Another  brother deceased age 50 from diabetes.  Another sister deceased from  cancer/Alzheimer's and another sister alive with cancer.   REVIEW OF SYSTEMS:  As per HPI, otherwise negative.   PHYSICAL EXAM:  Temperature 98.7, blood pressure 144/65, pulse of 78,  respirations 20, saturating 96% on room air.  GENERAL:  Patient in no  apparent distress.  HEENT:  Normocephalic, atraumatic.  Pupils equal, round and  reactive to  light and accommodation.  Extraocular movements intact.  Oropharynx is  clear.  No lesions, no exudates.  NECK:  Supple.  No lymphadenopathy.  RESPIRATORY:  Lungs are clear to auscultation bilaterally.  No wheezes,  no crackles, no rhonchi.  CARDIOVASCULAR:  Regular rate and rhythm.  No  murmurs, rubs or gallops.  ABDOMEN:  Soft, nontender, nondistended.  Positive bowel sounds.  EXTREMITIES:  No clubbing, cyanosis or edema.  NEUROLOGIC:  The patient is alert and oriented x3.  Cranial nerves II-  XII are grossly intact.  No focal deficits.   ADMISSION LABS:  Point of care cardiac markers CK-MB 1.5-1.3.  Troponin  I less than 0.05 to less than 0.05.  Myoglobin 91.6-99.5.  Lipase of 24.  Sodium 141, potassium 3.8, chloride 104, bicarbonate 30, BUN 14,  creatinine 0.77, glucose of 100.  Bilirubin 0.7, alkaline phosphatase  64, AST 29, ALT 29, albumin 3.5, protein 5.9, calcium of 9.2.  PT of  13.0, INR of 1.0.  CBC with a white count of 5.5, hemoglobin of 14.2,  platelets of 206, hematocrit 40.8, ANC of 4.5.  EKG with normal sinus  rhythm.  Chest X-Ray: No acute cardiopulmonary disease.   ASSESSMENT AND PLAN:  Mr. Eric [name] is 77 year old gentleman history of  prior tobacco use, peripheral vascular disease, transient ischemic  attack, hypertension, hyperlipidemia, history of mesenteric vein  thrombosis presented to the emergency department with a 2-week history  of worsening midsternal chest pain.   1. Chest pain differential includes acute coronary syndrome versus GI,      which is unlikely with a normal lipase and no other GI symptoms;      versus pulmonary, which is unlikely with a negative chest x-ray.      We will admit the patient to telemetry.  The patient does have      multiple risk factors of hypertension, prior tobacco use,      mesenteric vein thrombosis, hyperlipidemia, TIA, peripheral      vascular disease.  Will cycle cardiac enzymes q.8 h. x3.  Check a       BNP, check a TSH, check a UA, check a D-dimer.  Check a 2-D echo to      rule out LV dysfunction.  Will place on oxygen, morphine, aspirin,      Zocor, fish oil, nitroglycerin and Protonix.  Consult with GI in  the morning for further evaluation and recommendations.  2. Hyperlipidemia.  Check a fasting lipid panel.  Continue home dose      Zocor and fish oil.  3. Peripheral vascular disease.  4. History of TIA.  Aspirin.  5. GERD.  Protonix.  6. BPH.  Flomax.  7. History of mesenteric vein thrombosis.  8. Status post right hemicolectomy April 05, 2002 secondary to      dysplastic polyp.  9. Prophylaxis.  Protonix for GI prophylaxis.  Lovenox for DVT      prophylaxis.   It has been a pleasure taking care of Mr. Eric [name].      Ramiro Harvest, MD  Electronically Signed     DT/MEDQ  D:  02/17/2009  T:  02/17/2009  Job:  [id number]   cc:   Lynelle Smoke I. Patsi Sears, M.D.  Hal T. Stoneking, M.D.

## 2011-02-13 NOTE — Op Note (Signed)
[facility]  Patient:    Eric [name], Eric [name] Visit Number: [id number] MRN: [id number]          Service Type: SUR Location: 3W 0346 01 Attending Physician:  Delsa Bern Dictated by:   Lorne Skeens. Hoxworth, M.D. Proc. Date: 04/05/02 Admit Date:  04/05/2002 Discharge Date: 04/10/2002                             Operative Report  PREOPERATIVE DIAGNOSIS:  Malignant polyp of cecum.  POSTOPERATIVE DIAGNOSIS:  Malignant polyp of cecum.  PROCEDURE:  Right hemicolectomy.  SURGEON:  Lorne Skeens. Hoxworth, M.D.  ANESTHESIA:  General.  BRIEF HISTORY:  Eric [name] is 77 year old male who underwent a routine screening colonoscopy just recently by Dr. Randa Evens. This revealed two 1.5 cm polypoid masses in the cecum which were excised. The remainder of the colon looked normal. 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 The second polyp showed high grade dysplasia. With these findings, we have elected to proceed with right partial colectomy. The nature of the procedure, its indications, risks of bleeding, infection, anastomotic leaks, heart and lung complications were discussed and understood preoperatively. He is now brought to the operating room for this procedure following a mechanical and antibiotic bowel prep at home.  DESCRIPTION OF PROCEDURE:  The patient was brought to the operating room, placed in supine position on the operating table and general endotracheal anesthesia was induced. A Foley catheter was placed. He received broad spectrum antibiotics intravenously. PAS were in place. The abdomen was sterilely prepped and draped. A midline incision was used, skirting the umbilicus, and dissection carried down through the subcutaneous tissue and midline fascia and the peritoneum entered under direct vision. The patient had a previous left upper  paramedian incision for what sounded like either cecal volvulus or intestinal malrotation per the patients history. Indeed after lysing some anterior abdominal wall adhesions, it was clear the patient had at least partial malrotation with no colon on the right side of the abdomen with most of the small bowel in the right abdomen and the colon in the left abdomen. The bowel was able to be traced distally and terminal ileum identified. Some adhesions were lysed posteriorly bringing the cecum and terminal ileum up to the midline incision and further adhesions were lysed freeing the proximal colon over about 18 inches. There was no dilated small bowel and the patient had no history of small bowel obstruction and some other small bowel adhesions were left undisturbed. The liver was normal to palpation. The stomach was normal. The gallbladder appeared normal. Points of proximal and distal resection at the terminal ileum and distal right colon were chosen. The mesentery between these two areas was sequentially divided between clamps and tied with 2-0 silk ties and the larger vessels, ileocolic and right colic were doubly clamped and tied proximally. After division of the mesentery, a functional end-to-end anastomosis was created between the terminal ileum and the more distal right colon using a GIA stapler. The common ______ was closed and the specimen removed with a firing of the TA-60 stapler. The staple line was inspected for bleeding and appeared intact. The mesenteric defect was closed with interrupted 3-0 silk pop-offs. The viscera were returned to their anatomic position. I opened the specimen and there were two ulcerated areas in the cecum consistent with previously excised polyps and  no mass. Gloves and instruments were changed. The abdomen was thoroughly irrigated with warm saline and hemostasis assured. The viscera were returned to the abdomen. The midline fascia was closed with running  #1 PDS beginning at either end of the incision and tied centrally. The subcutaneous tissue was irrigated with antibiotic solution and the skin closed with staples. Sponge, needle and instrument counts were correct. A dry sterile dressing was applied and the patient was taken to recovery in good condition. Dictated by:   Lorne Skeens. Hoxworth, M.D. Attending Physician:  Delsa Bern DD:  04/05/02 TD:  04/09/02 Job: [id number]

## 2011-02-13 NOTE — Op Note (Signed)
   NAME:  Eric [name], Eric [name]                              ACCOUNT NO.:  [id number]   MEDICAL RECORD NO.:  [id number]                   PATIENT TYPE:  AMB   LOCATION:  ENDO                                 FACILITY:  [facility]   PHYSICIAN:  James L. Malon Kindle., M.D.          DATE OF BIRTH:  January 19, 1930   DATE OF PROCEDURE:  04/24/2003  DATE OF DISCHARGE:                                 OPERATIVE REPORT   PROCEDURE:  Colonoscopy with polypectomy.   MEDICATIONS:  1. Fentanyl 50 mcg.  2. Versed 2 mg IV.   SCOPE:  Olympus adult colonoscope.   INDICATION:  The patient had a right hemicolectomy a year ago due to  malignant polyp.  This is done as the year follow-up.   DESCRIPTION OF PROCEDURE:  The procedure had been explained to the patient  and consent obtained.  With the patient in the left lateral decubitus  position, the Olympus adult colonoscope was inserted and advanced.  We were  able to advance to the anastomosis.  This was seen in the vicinity of the  hepatic flexure and right upper quadrant.  The scope was withdrawn.  The  hepatic flexure, transverse colon, the splenic flexure, descending colon  were seen well.  A 1 cm polyp in the proximal descending colon was removed.  This was sessile.  We recovered it.  It would not go through the scope.  We  advanced back to the descending colon and continued withdrawal.  There was  no significant diverticular disease.  In the mid sigmoid, a 0.5 cm polyp  removed through the snare.  This was sessile, sucked through the scope.  In  the rectum, another 0.5 cm polyp removed with the snare and sucked through  the scope.  The scope was withdrawn.  The patient tolerated the procedure  well.  There were no immediate complications.   ASSESSMENT:  Multiple colon polyps in this gentleman who has had a previous  right colectomy for colon cancer.    PLAN:  1. Routine postpolypectomy instructions.  2. We will recommend repeating in one year.                                      James L. Malon Kindle., M.D.    Waldron Session  D:  04/24/2003  T:  04/24/2003  Job:  [id number]   cc:   Hal T. Stoneking, M.D.  [address]  [place], [place] [address]  Fax: [phone]   Doreen Beam  [address]  [place]  [place] [address]  Fax: [phone]

## 2011-02-13 NOTE — Op Note (Signed)
[place]  Patient:    Eric [name], Eric [name]                           MRN: [id number] Proc. Date: 04/12/01 Adm. Date:  [id number] Attending:  Emeterio Reeve                           Operative Report  PREOPERATIVE DIAGNOSIS:  Herniated disk at L2-3 on the right side.  POSTOPERATIVE DIAGNOSIS:  Herniated disk at L2-3 on the right side.  PROCEDURE:  Right L2-3 laminectomy, diskectomy.  SURGEON:  Payton Doughty, M.D.  ASSISTANT:  Hewitt Shorts, M.D.  ANESTHESIA:  General endotracheal.  PREPARATION:  Sterile Betadine prep and scrub with alcohol wipe.  COMPLICATIONS:  none.  DESCRIPTION OF PROCEDURE:  This is a 77 year old gentleman with a right L2-3 radiculopathy.  He was taken to the operating room and smoothly anesthetized and intubated and placed prone on the operating table.  Following shave, prep, and drape in the usual sterile fashion, skin was infiltrated with 1% lidocaine and 1:400,000 epinephrine, and the skin was incised from the bottom of L1 to mid-L3, and the laminae of L1, L2, and L3 exposed on the right side in the subperiosteal plane.  Intraoperative x-ray showed a marker under L1. Laminectomy was carried out and hemisemilaminectomy was carried out on L2. The right L3 root was carefully dissected free.  In the right lateral recess, some herniated disk was found and grasped without difficulty and removed.  The disk space was then carefully curetted out into the foramen.  Following complete decompression of the foramen from the inside, it was carefully palpated with the hook and found to have no obstruction.  Extraforaminal diskectomy was not done.  Following completion of the diskectomy, the laminectomy defect was covered with Depo-Medrol-soaked fat.  The fascia was then reapproximated with 0 Vicryl in interrupted fashion, the subcutaneous tissue was reapproximated with 0 Vicryl in interrupted fashion.  Subcuticular tissue was  reapproximated with 3-0 Vicryl in interrupted fashion.  The skin was closed with 3-0 nylon in running locked fashion.  Betadine and Telfa dressing was applied and made occlusive with OpSite.  The patient then returned to the recovery room in good condition. DD:  04/12/01 TD:  04/13/01 Job: [id number]

## 2011-02-13 NOTE — Procedures (Signed)
[place]  Patient:    Eric [name], SPIEKER Visit Number: [id number] MRN: [id number]          Service Type: DSU Location: [facility] 2864 01 Attending Physician:  Orland Mustard Dictated by:   Llana Aliment Randa Evens, M.D. Proc. Date: 03/21/02 Admit Date:  03/21/2002   CC:         Hal T. Stoneking, M.D.   Procedure Report  DATE OF BIRTH:  04/18/1930.  PROCEDURE:  Colonoscopy and polypectomy.  MEDICATIONS:  Fentanyl 50 mcg, Versed 5 mg IV.  INDICATION:  Colon cancer screening.  DESCRIPTION OF PROCEDURE:  The procedure had been explained to the patient and consent obtained.  With the patient in the left lateral decubitus position, the Olympus pediatric video colonoscope inserted, advanced under direct visualization.  Prep excellent.  Reached the cecum.  Ileocecal valve and appendiceal orifice identified.  Just above the ileocecal valve were two polyps, each about 1.5 cm and sessile.  They were removed in several pieces, sucked through the scope, and due to the edema around it, it is not entirely clear if one of the polyps was completely removed although it did appear that it was nearly completely removed.  The pieces were sucked through the scope as well and were recovered with the retrieval basket.  The remainder of the colon was examined on the way out, and no other polyps were seen.  The patient tolerated the procedure well, was maintained on low-flow oxygen and pulse oximeter throughout the procedure.  ASSESSMENT:  Large cecal polyps, removed.  Question if removal was complete.  PLAN:  Will check pathology and if benign polyps, will wait about six months and repeat the colonoscopy with the argon plasma coagulator. Dictated by:   Llana Aliment. Randa Evens, M.D. Attending Physician:  Orland Mustard DD:  03/21/02 TD:  03/22/02 Job: [id number]

## 2011-02-13 NOTE — Discharge Summary (Signed)
Eric [name], Eric [name]                              ACCOUNT NO.:  [id number]   MEDICAL RECORD NO.:  [id number]                   PATIENT TYPE:  INP   LOCATION:  0361                                 FACILITY:  [facility]   PHYSICIAN:  Sandria Bales. Ezzard Standing, M.D.               DATE OF BIRTH:  1930/04/14   DATE OF ADMISSION:  04/13/2002  DATE OF DISCHARGE:  04/16/2002                                 DISCHARGE SUMMARY   DISCHARGE DIAGNOSES:  1. Abdominal pain, nonspecific, etiology unclear.  2. Status post recent right hemicolectomy for tubovillous adenoma.   OPERATIONS PERFORMED:  None.   HISTORY OF ILLNESS:  Mr. Gunkel is a 77 year old white male who is a patient  of Dr. Merlene Laughter, sees Dr. Fayrene Fearing L. Edwards from a gastroenterology  standpoint.  He had a recent right hemicolectomy on April 05, 2002 at [facility] by Dr. Sharlet Salina T. 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  No cancer was identified.   The patient was discharged on April 10, 2002 after an uneventful hospital  course, however, within a day of being discharged he had diffuse abdominal  pain and a low grade temp of 100.3.  He was seen in our urgent office by Dr.  Kendrick Ranch on the afternoon of April 13, 2002 who was concerned about the  patient and was admitted to [facility].  I was the on call  physician and did the admission H&P.   PAST MEDICAL HISTORY:  Noncontributory.   PHYSICAL EXAMINATION:  Revealed a temperature of 97.0, pulse 103, blood  pressure 142/70.  His white blood count was 10,500, hemoglobin 12,  hematocrit 37.  His urinalysis was unremarkable.  His mildly elevated liver  enzymes was SGPT of 46.   It was unclear what was causing his pain.  He was admitted, placed on IV  fluids, IV antibiotics, clear liquids and watch.  By the first day he was  somewhat better, still complaining of a mild diffuse abdominal pain.  By his  third hospital day he was clearly  better, his white blood count was at  7,900.  He was afebrile.  He was wanting to go home and discharged home on a  regular diet with oral pain medications.   He was given Vicodin for pain, Ambien for sleep.  He is to see Dr. Sharlet Salina  T. Hoxworth within a week, and call for any interval problem.  The etiology  of his abdominal pain was never identified.                                               Sandria Bales. Ezzard Standing, M.D.   DHN/MEDQ  D:  04/27/2002  T:  05/03/2002  Job:  [id number]   cc:   Sharlet Salina T. Hoxworth, M.D.  Fax: [phone]   Llana Aliment. Malon Kindle., M.D.

## 2011-02-13 NOTE — Discharge Summary (Signed)
NAMEJADIEN, LEHIGH                              ACCOUNT NO.:  [id number]   MEDICAL RECORD NO.:  [id number]                   PATIENT TYPE:  INP   LOCATION:  0467                                 FACILITY:  [facility]   PHYSICIAN:  Sharlet Salina T. Hoxworth, M.D.          DATE OF BIRTH:  03-18-1930   DATE OF ADMISSION:  04/17/2002  DATE OF DISCHARGE:  04/25/2002                                 DISCHARGE SUMMARY   DISCHARGE DIAGNOSIS:  Acute mesenteric vein thrombosis.   PROCEDURE:  None.   HISTORY OF PRESENT ILLNESS:  The patient is a 77 year old white male status  post right hemicolectomy on 04/05/02, for a dysplastic polyp.  He was  discharged doing well, but readmitted on 04/13/02, with diarrhea, nausea, and  vague abdominal discomfort.  He was treated with IV fluids and observation,  and was discharged on 04/16/02, feeling much better.  He now presents to my  office with recurrent nausea, unable to eat and drink without vomiting.  He  has felt chilled, and has some vague persistent upper abdominal pain.   PAST MEDICAL HISTORY:  1. As above.  2. History of peptic ulcer disease.  3. Status post femoral artery stenting and exploratory laparotomy 30 years     ago for twisted intestines.  4. He had malrotation noted at the time of his recent surgery.   MEDICATIONS:  Protonix.   PHYSICAL EXAMINATION:  VITAL SIGNS:  He is afebrile, and vital signs are all  within normal limits.  ABDOMEN:  Moderate right mid abdominal tenderness, and a healing incision  without infection.   HOSPITAL COURSE:  The patient was admitted for further evaluation and  treatment.  A CT scan of the abdomen was obtained which revealed extensive  mesenteric venous thrombosis, although there was still flow through the  vein.  There was moderately dilated thickened loops of small bowel in the  right and mid abdomen.  Small bilateral adrenal masses were noted.  The  patient was treated with IV fluids and made n.p.o.  He was  started  immediately on IV heparin.  He steadily improved throughout his  hospitalization with gradually decreasing discomfort and nausea.  He was  started on a full liquid diet on 04/19/02, which he tolerated well, and was  able to be advanced steadily to a  regular diet.  He was transitioned to Coumadin from a long-term therapy.  He  was felt ready for discharge on 04/25/02.  INR was 2.6.  Dr. Pete Glatter has  agreed to follow his Coumadin therapy.   FOLLOWUP:  In my office in two weeks.                                               Lorne Skeens. Hoxworth, M.D.  BTH/MEDQ  D:  06/04/2002  T:  06/04/2002  Job:  [id number]   cc:   Hal T. Stoneking, M.D.  [address] [place], [place] [address]  Fax: [phone]

## 2011-02-13 NOTE — H&P (Signed)
[facility]  Patient:    Eric [name], Eric [name] Visit Number: [id number] MRN: [id number]          Service Type: EMS Location: ED Attending Physician:  Devoria Albe Dictated by:   Sandria Bales. Ezzard Standing, M.D. Admit Date:  04/13/2002   CC:         Sharlet Salina T. Hoxworth, M.D.  James L. Randa Evens, M.D.  Hal T. Stoneking, M.D.   History and Physical  DATE OF BIRTH:  25-May-1930.  HISTORY OF PRESENT ILLNESS:  This is a 77 year old white male who is a patient of Dr. Merlene Laughter, sees Dr. Carman Ching from a gastroenterology standpoint.  He had a recent right hemicolectomy on April 05, 2002, at [facility] by Dr. 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  There was no cancer identified, and he had six benign lymph nodes.  The patient was discharged from [facility] on April 10, 2002, apparently doing pretty well; however, within one day of discharge the patient had a decreasing appetite, increasing diffuse abdominal pain, and this morning he had a temperature of 100.3.  He was seen in the urgent office by Timothy E. Earlene Plater, M.D., who was more concerned about the patient and admitted him this evening to [facility].  I am the on-call physician doing an admission H&P.  PAST MEDICAL HISTORY:  NEUROLOGIC:  He had a TIA approximately 12 years ago and has complained of some twitching in his legs today.  CARDIAC:  He has hypertension and hypercholesterolemia, though apparently he is not on medicines for either one of these.  GASTROINTESTINAL:  He has had a history of reflux, and again he had abdominal surgery on July 9.  UROLOGIC:  He has a history of a "enlarged prostate."  His wife was in the room while I examined him.  ALLERGIES:  He has no allergies.  MEDICATIONS:  He is on Protonix one tablet daily.  He takes a baby aspirin, has not been on any since surgery.  He is on multiple eye drops for  glaucoma.  PHYSICAL EXAMINATION:  VITAL SIGNS:  His temperature is 97.0, pulse of 102, blood pressure 142/70.  GENERAL:  He is a well-nourished white male who does not look that ill, looking at him.  He is a little hard of hearing.  NECK:  Supple.  CHEST:  His lungs are clear to auscultation.  CARDIAC:  His heart has a regular rate and rhythm.  ABDOMEN:  He has some diffuse vague tenderness.  His incision looks good.  He has bowel sounds that are active.  I feel no hernia, no discrete mass.  EXTREMITIES:  He has good strength in all four extremities.  LABORATORY DATA:  Labs that I have show a white blood count of 10,500, a hemoglobin of 12, hematocrit of 37.  He has a sodium of 138, potassium 3.9, chloride of 103, CO2 of 27, glucose of 130.  His SGPT is 46, his albumin is 3.4.  His urinalysis was unremarkable.  IMPRESSION:  Status post right hemicolectomy with uneventful postoperative course, now with a nonspecific diffuse abdominal pain.  PLAN:  Start him on IV antibiotics as a prophylactic measure.  Will check amylase and lipase in the morning, repeat his CBC, give him IV fluids, limit his diet to clear liquids at this time.  I will follow up with repeat exam and repeat labs. Dictated by:   Sandria Bales. Ezzard Standing, M.D. Attending Physician:  Devoria Albe DD:  04/13/02 TD:  04/14/02 Job: [id number]

## 2011-02-13 NOTE — Discharge Summary (Signed)
NAMEURAL, ACREE                              ACCOUNT NO.:  [id number]   MEDICAL RECORD NO.:  [id number]                   PATIENT TYPE:  INP   LOCATION:  0346                                 FACILITY:  [facility]   PHYSICIAN:  Lorne Skeens. Hoxworth, M.D.          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   SURGICAL PROCEDURE:  Right colectomy on April 05, 2002.   HISTORY OF PRESENT ILLNESS:  The patient is a 77 year old male followed by  Dr. Randa Evens and Dr. Pete Glatter.  He underwent a routine screening colonoscopy  at the recommendation of Dr. Pete Glatter recently which revealed two 1.5-cm  polypoid masses in the cecum which were excised.  The remainder of the colon  looked normal.  Pathology returned.  One of the lesions showing  tubulovillous polyp with adenocarcinoma extending to the base of the  excision.  The second polyp showed high grade dysplasia.  The patient denies  any abdominal pain, melena, hematochezia, or change in bowel habits.  After  discussion of options, we elected to proceed with right colectomy and was  admitted for this procedure.   PAST MEDICAL HISTORY:  Peripheral vascular disease and hypercholesterolemia.   SURGICAL HISTORY:  Femoral artery extending bilaterally by Dr. Arbie Cookey for  claudication.  He has also had exploratory laparotomy through an upper  midline incision 30 years ago for twisted intestine.  His appendix was  removed at the time.  He has had two lumbar laminectomies.   ADMISSION MEDICATIONS:  Protonix and aspirin, the latter which has been  stopped.   ALLERGIES:  No known drug allergies.   FAMILY HISTORY:  See H&P.   SOCIAL HISTORY:  See H&P.   REVIEW OF SYSTEMS:  See H&P.   PHYSICAL EXAMINATION:  Afebrile.  Vital signs normal.  Generally healthy-  appearing white male.  Abdomen  showed healed left upper quadrant incision.  Soft, nontender without mass or organomegaly.   HOSPITAL COURSE:  The patient was admitted following mechanical antibiotic  bowel prep and, on April 05, 2002, underwent exploration and right  hemicolectomy.  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  His  postoperative course was smooth.  He had no instability over the first  couple of days, was kept NPO and activity gradually increased.  By the third  postoperative day, he began to pass some flatus and clear liquids were  started.  This was gradually able to be advanced and, by April 10, 2002, he  was tolerating a regular diet.  Abdomen was benign, wound healing primarily  and his bowels were moving.  He was discharged home at this time.   DISCHARGE MEDICATIONS:  Same  as admission plus Tylox or Tylenol as needed  for pain.   FOLLOW UP:  My office in 10-14 days.   FINAL DIAGNOSES:  Residual ulcer in the cecum with focal tubulovillous  adenoma with high grade dysplasia but no invasive cancer identified and all  nodes were negative.                                                Lorne Skeens. Hoxworth, M.D.    Tory Emerald  D:  05/08/2002  T:  05/14/2002  Job:  [id number]   cc:   Hal T. Stoneking, M.D.  [address]  [place], [place] [address]  Fax: [phone]   Llana Aliment. Malon Kindle., M.D.

## 2011-02-13 NOTE — H&P (Signed)
[place]  Patient:    Eric [name], Eric [name]                           MRN: [id number] Adm. Date:  04/12/01 Attending:  Payton Doughty, M.D.                         History and Physical  ADMITTING DIAGNOSIS: Herniated disk, L2-3, centric to the right.  HISTORY OF PRESENT ILLNESS: This is a 77 year old right-handed white gentleman, who has seen Dr. Jeral Fruit in the past.  He had had a disk done in 1997.  I visited with him several years ago when he had some back pain and had a positive discography, and I considered doing a fusion; however, he did not want to do it.  He reported to my office last week with crushing right leg pain and an MRI showed a herniated disk at L2-3, centric to the right side, and he is now admitted for lumbar diskectomy.  PAST MEDICAL HISTORY:  1. Upper GI surgery in 1960.  2. L5-S1 disk in 1997.  MEDICATIONS: None.  ALLERGIES: None.  SOCIAL HISTORY: He stopped smoking about five years ago.  FAMILY HISTORY: Mom had died at 61 of hypertension and diabetes.  Daddy died at 48 of a stroke.  PHYSICAL EXAMINATION:  HEENT: Within normal limits.  NECK: He has good range of motion of his neck.  CHEST: Clear.  CARDIAC: Regular rate and rhythm.  ABDOMEN: Nontender.  No hepatosplenomegaly.  EXTREMITIES: No clubbing or cyanosis.  Peripheral pulses good.  GU: Deferred.  NEUROLOGIC: He is awake and alert and oriented.  Cranial nerves intact.  Motor examination showed 5/5 strength throughout the upper and lower extremities. Straight leg raise is positive on the right.  He has diminished knee jerk on the right.  IMPRESSION/PLAN: I discussed the risks and benefits of doing steroids versus operative intervention and he did not feel the steroids helped him very much, so he is now admitted for lumbar laminectomy and diskectomy at L2-3 on the right side.  The risks and benefits of this approach have been discussed with him and he wishes to  proceed. DD:  04/12/01 TD:  04/12/01 Job: [id number]

## 2011-02-27 DIAGNOSIS — J189 Pneumonia, unspecified organism: Secondary | ICD-10-CM

## 2011-02-27 HISTORY — DX: Pneumonia, unspecified organism: J18.9

## 2011-03-11 ENCOUNTER — Other Ambulatory Visit: Payer: Self-pay | Admitting: Internal Medicine

## 2011-03-12 ENCOUNTER — Ambulatory Visit: Payer: Self-pay | Admitting: Internal Medicine

## 2011-03-13 ENCOUNTER — Ambulatory Visit: Payer: Self-pay | Admitting: Family Medicine

## 2011-03-31 ENCOUNTER — Ambulatory Visit: Payer: Self-pay | Admitting: Urology

## 2011-04-01 ENCOUNTER — Encounter: Payer: Self-pay | Admitting: Internal Medicine

## 2011-04-08 ENCOUNTER — Encounter (HOSPITAL_BASED_OUTPATIENT_CLINIC_OR_DEPARTMENT_OTHER): Payer: Medicare Other | Admitting: Internal Medicine

## 2011-04-08 ENCOUNTER — Other Ambulatory Visit: Payer: Self-pay | Admitting: Internal Medicine

## 2011-04-08 DIAGNOSIS — D649 Anemia, unspecified: Secondary | ICD-10-CM

## 2011-04-08 LAB — CBC & DIFF AND RETIC
BASO%: 0.3 % (ref 0.0–2.0)
EOS%: 5.3 % (ref 0.0–7.0)
HCT: 34.1 % — ABNORMAL LOW (ref 38.4–49.9)
Immature Retic Fract: 18.8 % — ABNORMAL HIGH (ref 0.00–13.40)
MCH: 35.1 pg — ABNORMAL HIGH (ref 27.2–33.4)
MCHC: 33.7 g/dL (ref 32.0–36.0)
MCV: 104 fL — ABNORMAL HIGH (ref 79.3–98.0)
MONO%: 6.3 % (ref 0.0–14.0)
NEUT%: 45.8 % (ref 39.0–75.0)
RDW: 15.4 % — ABNORMAL HIGH (ref 11.0–14.6)
lymph#: 1.7 10*3/uL (ref 0.9–3.3)

## 2011-04-10 LAB — IRON AND TIBC
%SAT: 29 % (ref 20–55)
Iron: 98 ug/dL (ref 42–165)
UIBC: 237 ug/dL

## 2011-04-10 LAB — PROTEIN ELECTROPHORESIS, SERUM
Alpha-2-Globulin: 12.1 % — ABNORMAL HIGH (ref 7.1–11.8)
Beta 2: 2.4 % — ABNORMAL LOW (ref 3.2–6.5)
Beta Globulin: 17.2 % — ABNORMAL HIGH (ref 4.7–7.2)
Gamma Globulin: 2.6 % — ABNORMAL LOW (ref 11.1–18.8)
M-Spike, %: 0.84 g/dL

## 2011-04-10 LAB — COMPREHENSIVE METABOLIC PANEL
AST: 19 U/L (ref 0–37)
Alkaline Phosphatase: 63 U/L (ref 39–117)
BUN: 15 mg/dL (ref 6–23)
Glucose, Bld: 89 mg/dL (ref 70–99)
Potassium: 4.2 mEq/L (ref 3.5–5.3)
Sodium: 140 mEq/L (ref 135–145)
Total Bilirubin: 0.3 mg/dL (ref 0.3–1.2)

## 2011-04-10 LAB — FOLATE: Folate: >20 ng/mL

## 2011-04-10 LAB — FERRITIN: Ferritin: 76 ng/mL (ref 22–322)

## 2011-04-27 ENCOUNTER — Encounter (HOSPITAL_BASED_OUTPATIENT_CLINIC_OR_DEPARTMENT_OTHER): Payer: Medicare Other | Admitting: Internal Medicine

## 2011-04-27 ENCOUNTER — Other Ambulatory Visit: Payer: Self-pay | Admitting: Internal Medicine

## 2011-04-27 DIAGNOSIS — D649 Anemia, unspecified: Secondary | ICD-10-CM

## 2011-04-27 LAB — CBC WITH DIFFERENTIAL/PLATELET
BASO%: 0.6 % (ref 0.0–2.0)
EOS%: 3.3 % (ref 0.0–7.0)
Eosinophils Absolute: 0.2 10*3/uL (ref 0.0–0.5)
LYMPH%: 35.3 % (ref 14.0–49.0)
MCH: 37.6 pg — ABNORMAL HIGH (ref 27.2–33.4)
MCHC: 35.2 g/dL (ref 32.0–36.0)
MCV: 106.9 fL — ABNORMAL HIGH (ref 79.3–98.0)
MONO%: 6.8 % (ref 0.0–14.0)
Platelets: 242 10*3/uL (ref 140–400)
RBC: 3.24 10*6/uL — ABNORMAL LOW (ref 4.20–5.82)

## 2011-09-25 ENCOUNTER — Telehealth: Payer: Self-pay | Admitting: *Deleted

## 2011-10-03 ENCOUNTER — Telehealth: Payer: Self-pay | Admitting: Internal Medicine

## 2011-10-03 NOTE — Telephone Encounter (Signed)
S/w the pt regarding his 2013 appts. Per pt he saw dr Pete Glatter and he did not think he needs to be seen just yet. Per pt he has cancelled his appts for 2013.

## 2011-10-29 ENCOUNTER — Other Ambulatory Visit: Payer: Medicare Other | Admitting: Lab

## 2011-10-29 ENCOUNTER — Ambulatory Visit: Payer: Medicare Other | Admitting: Internal Medicine

## 2011-11-05 ENCOUNTER — Other Ambulatory Visit: Payer: Medicare Other | Admitting: Lab

## 2011-11-05 ENCOUNTER — Ambulatory Visit: Payer: Medicare Other | Admitting: Internal Medicine

## 2014-02-15 ENCOUNTER — Other Ambulatory Visit: Payer: Self-pay | Admitting: *Deleted

## 2014-02-15 DIAGNOSIS — I70219 Atherosclerosis of native arteries of extremities with intermittent claudication, unspecified extremity: Secondary | ICD-10-CM

## 2014-02-15 DIAGNOSIS — I739 Peripheral vascular disease, unspecified: Secondary | ICD-10-CM

## 2014-02-21 ENCOUNTER — Telehealth: Payer: Self-pay | Admitting: Internal Medicine

## 2014-02-21 NOTE — Telephone Encounter (Signed)
S/W PATIENT WIFE AND GAVE F/U APPT FOR 06/17 @ 8:30 W/DR. MOHAMED.

## 2014-02-22 ENCOUNTER — Telehealth: Payer: Self-pay | Admitting: *Deleted

## 2014-02-22 NOTE — Telephone Encounter (Signed)
Referral paperwork along with lab work from 5/21 done at Dr Carlyle Lipa office at [facility] RN desk.  Pt's f/u is 6/17.  SLJ

## 2014-03-06 ENCOUNTER — Other Ambulatory Visit: Payer: Self-pay | Admitting: *Deleted

## 2014-03-06 DIAGNOSIS — I70219 Atherosclerosis of native arteries of extremities with intermittent claudication, unspecified extremity: Secondary | ICD-10-CM

## 2014-03-12 ENCOUNTER — Encounter: Payer: Self-pay | Admitting: Vascular Surgery

## 2014-03-13 ENCOUNTER — Encounter: Payer: Self-pay | Admitting: Vascular Surgery

## 2014-03-13 ENCOUNTER — Other Ambulatory Visit: Payer: Self-pay | Admitting: Vascular Surgery

## 2014-03-13 ENCOUNTER — Ambulatory Visit (INDEPENDENT_AMBULATORY_CARE_PROVIDER_SITE_OTHER)
Admission: RE | Admit: 2014-03-13 | Discharge: 2014-03-13 | Disposition: A | Discharge status: Home / Self Care | Payer: Medicare HMO | Source: Ambulatory Visit | Attending: Vascular Surgery | Admitting: Vascular Surgery

## 2014-03-13 ENCOUNTER — Encounter: Payer: Self-pay | Admitting: *Deleted

## 2014-03-13 ENCOUNTER — Other Ambulatory Visit: Payer: Self-pay | Admitting: *Deleted

## 2014-03-13 ENCOUNTER — Ambulatory Visit (HOSPITAL_COMMUNITY)
Admission: RE | Admit: 2014-03-13 | Discharge: 2014-03-13 | Disposition: A | Discharge status: Home / Self Care | Payer: Medicare HMO | Source: Ambulatory Visit | Attending: Vascular Surgery | Admitting: Vascular Surgery

## 2014-03-13 ENCOUNTER — Ambulatory Visit (INDEPENDENT_AMBULATORY_CARE_PROVIDER_SITE_OTHER): Payer: Commercial Managed Care - HMO | Admitting: Vascular Surgery

## 2014-03-13 VITALS — BP 145/58 | HR 55 | Ht 66.0 in | Wt 140.9 lb

## 2014-03-13 DIAGNOSIS — I739 Peripheral vascular disease, unspecified: Secondary | ICD-10-CM

## 2014-03-13 DIAGNOSIS — I70219 Atherosclerosis of native arteries of extremities with intermittent claudication, unspecified extremity: Secondary | ICD-10-CM | POA: Insufficient documentation

## 2014-03-13 DIAGNOSIS — Z48812 Encounter for surgical aftercare following surgery on the circulatory system: Secondary | ICD-10-CM

## 2014-03-13 NOTE — Progress Notes (Signed)
Patient name: Eric [name] MRN: [id number] DOB: April 22, 1930 Sex: male   Referred by: Felipa Eth  Reason for referral:  Chief Complaint  Patient presents with  . New Evaluation    L leg worsening claudication     HISTORY OF PRESENT ILLNESS: Patient has today for evaluation of left leg claudication this is mild in the right as well. He is here today with his wife. He remains in very good health and is quite active. He denies any rest pain and denies any tissue loss. He reports that he remains active in bowling and and this yard and this is quite limiting to him. He does have a remote history of left leg stenting in 1999. I'm trying to obtain past records to see exactly what was treated. He had very good long-term result reports this is now become intolerable claudication.  Past Medical History  Diagnosis Date  . Peripheral vascular disease   . Transient global amnesia   . Hypercholesterolemia   . Colon cancer   . Mesenteric thrombosis     post colon resection  . Hypertension   . Macrocytic anemia   . Glaucoma   . Osteoporosis   . GERD (gastroesophageal reflux disease)   . Cholelithiasis   . Shingles 2009  . Pneumonia 02-2011    left lower lobe  . Stroke   . CAD (coronary artery disease)     Past Surgical History  Procedure Laterality Date  . Laminectomies      multiple  . Exploratory laparotomy      treat twisted intestines  . Colectomy      to remove adenoma  . Femoral artery stenting      History   Social History  . Marital Status: Single    Spouse Name: N/A    Number of Children: N/A  . Years of Education: N/A   Occupational History  . Not on file.   Social History Main Topics  . Smoking status: Former Smoker    Types: Cigarettes    Quit date: 09/28/1988  . Smokeless tobacco: Current User    Types: Chew  . Alcohol Use: Yes     Comment: rare  . Drug Use: No  . Sexual Activity: Not on file   Other Topics Concern  . Not on file   Social History  Narrative  . No narrative on file    Family History  Problem Relation Age of Onset  . Diabetes Mother   . Heart disease Mother   . Hypertension Mother   . Heart disease Father   . Hypertension Father   . Diabetes Father   . Hyperlipidemia Father   . Peripheral vascular disease Father   . Cancer Brother   . Diabetes Brother   . Hypertension Brother   . Heart disease Brother     before age 46  . Cancer Sister     Allergies as of 03/13/2014  . (No Known Allergies)    Current Outpatient Prescriptions on File Prior to Visit  Medication Sig Dispense Refill  . aspirin 81 MG tablet Take 81 mg by mouth daily.      . calcium citrate-vitamin D (CITRACAL+D) 315-200 MG-UNIT per tablet Take 1 tablet by mouth 2 (two) times daily.      . clonazePAM (KLONOPIN) 0.5 MG tablet Take 0.5 mg by mouth daily.      Marland Kitchen esomeprazole (NEXIUM) 40 MG capsule Take 40 mg by mouth daily at 12 noon.      Marland Kitchen  finasteride (PROSCAR) 5 MG tablet Take 5 mg by mouth daily.      . Flaxseed, Linseed, (FLAX SEED OIL PO) Take 1,000 mg by mouth daily.      . hydrochlorothiazide (HYDRODIURIL) 25 MG tablet Take 25 mg by mouth daily.      Marland Kitchen lisinopril (PRINIVIL,ZESTRIL) 5 MG tablet Take 5 mg by mouth daily.      . Multiple Vitamins-Minerals (MULTIVITAMIN PO) Take by mouth daily.      . Omega-3 Fatty Acids (FISH OIL) 1000 MG CAPS Take 1,000 mg by mouth daily.      . simvastatin (ZOCOR) 20 MG tablet Take 20 mg by mouth daily.      . tamsulosin (FLOMAX) 0.4 MG CAPS capsule Take 0.4 mg by mouth daily.       No current facility-administered medications on file prior to visit.     REVIEW OF SYSTEMS:  Positives indicated with an "X"  CARDIOVASCULAR:  [ ]  chest pain   [ ]  chest pressure   [ ]  palpitations   [ ]  orthopnea   [ x] dyspnea on exertion   [x ] claudication   [ ]  rest pain   [ ]  DVT   [ ]  phlebitis PULMONARY:   [ ]  productive cough   [ ]  asthma   [ ]  wheezing NEUROLOGIC:   [x ] weakness  [x ] paresthesias  [ ]   aphasia  [ ]  amaurosis  [ ]  dizziness HEMATOLOGIC:   [ ]  bleeding problems   [ ]  clotting disorders MUSCULOSKELETAL:  [ ]  joint pain   [ ]  joint swelling GASTROINTESTINAL: [ ]   blood in stool  [ ]   hematemesis GENITOURINARY:  [ ]   dysuria  [ ]   hematuria PSYCHIATRIC:  [ ]  history of major depression INTEGUMENTARY:  [ ]  rashes  [ ]  ulcers CONSTITUTIONAL:  [ ]  fever   [ ]  chills  PHYSICAL EXAMINATION:  General: The patient is a well-nourished male, in no acute distress. Vital signs are BP 145/58  Pulse 55  Ht 5\' 6"  (1.676 m)  Wt 140 lb 14.4 oz (63.912 kg)  BMI 22.75 kg/m2  SpO2 100% Pulmonary: There is a good air exchange bilaterally without wheezing or rales. Abdomen: Soft and non-tender with normal pitch bowel sounds. Musculoskeletal: There are no major deformities.  There is no significant extremity pain. Neurologic: No focal weakness or paresthesias are detected, Skin: There are no ulcer or rashes noted. Psychiatric: The patient has normal affect. Cardiovascular: There is a regular rate and rhythm without significant murmur appreciated. Carotid arteries without bruits bilaterally He does have 2+ radial pulses and palpable femoral pulses bilaterally. He does have a palpable popliteal pulses which are 1+. I do not palpate pedal pulses   VVS Vascular Lab Studies:  Ordered and Independently Reviewed these reveal normal ankle arm indices bilaterally with triphasic right dorsalis pedis and triphasic left posterior tibial signals. Duplex of his lower extremities reveal some elevated velocities in his right common femoral artery and the superficial femoral arteries bilaterally. Flow in the left common femoral artery is turbulent with biphasic flow  Impression and Plan:  Had a long discussion with the patient and his wife present. Extremities have any evidence of limb threatening ischemia. His symptoms are quite consistent with arterial claudication symptoms. I have recommended  arteriography to further evaluation. He may be a candidate for H. plasty and stenting for improvement of his walking ability. He reports that he is unable to tolerate this level of  claudication and wished to proceed as soon as possible. We have scheduled this for July 1 as an outpatient at the hospital. The patient understands that if we can intervene we will at this same setting    EARLY, TODD [facility] Office: [phone]

## 2014-03-14 ENCOUNTER — Encounter: Payer: Self-pay | Admitting: Internal Medicine

## 2014-03-14 ENCOUNTER — Telehealth: Payer: Self-pay | Admitting: Internal Medicine

## 2014-03-14 ENCOUNTER — Ambulatory Visit (HOSPITAL_BASED_OUTPATIENT_CLINIC_OR_DEPARTMENT_OTHER): Payer: Commercial Managed Care - HMO | Admitting: Internal Medicine

## 2014-03-14 ENCOUNTER — Other Ambulatory Visit: Payer: Self-pay | Admitting: *Deleted

## 2014-03-14 ENCOUNTER — Ambulatory Visit: Payer: Commercial Managed Care - HMO

## 2014-03-14 VITALS — BP 131/53 | HR 61 | Temp 97.5°F | Resp 18 | Ht 66.0 in | Wt 141.7 lb

## 2014-03-14 DIAGNOSIS — D472 Monoclonal gammopathy: Secondary | ICD-10-CM

## 2014-03-14 DIAGNOSIS — D638 Anemia in other chronic diseases classified elsewhere: Secondary | ICD-10-CM

## 2014-03-14 DIAGNOSIS — D509 Iron deficiency anemia, unspecified: Secondary | ICD-10-CM

## 2014-03-14 LAB — COMPREHENSIVE METABOLIC PANEL (CC13)
ALT: 13 U/L (ref 0–55)
AST: 15 U/L (ref 5–34)
Albumin: 3.4 g/dL — ABNORMAL LOW (ref 3.5–5.0)
Alkaline Phosphatase: 76 U/L (ref 40–150)
Anion Gap: 9 mEq/L (ref 3–11)
BILIRUBIN TOTAL: 0.22 mg/dL (ref 0.20–1.20)
BUN: 17.2 mg/dL (ref 7.0–26.0)
CALCIUM: 9.8 mg/dL (ref 8.4–10.4)
CHLORIDE: 104 meq/L (ref 98–109)
CO2: 29 mEq/L (ref 22–29)
Creatinine: 1 mg/dL (ref 0.7–1.3)
Glucose: 102 mg/dl (ref 70–140)
Potassium: 4.3 mEq/L (ref 3.5–5.1)
Sodium: 142 mEq/L (ref 136–145)
Total Protein: 6.5 g/dL (ref 6.4–8.3)

## 2014-03-14 LAB — CBC WITH DIFFERENTIAL/PLATELET
BASO%: 0.5 % (ref 0.0–2.0)
Basophils Absolute: 0 10*3/uL (ref 0.0–0.1)
EOS%: 4 % (ref 0.0–7.0)
Eosinophils Absolute: 0.1 10*3/uL (ref 0.0–0.5)
HEMATOCRIT: 32.6 % — AB (ref 38.4–49.9)
HGB: 10.9 g/dL — ABNORMAL LOW (ref 13.0–17.1)
LYMPH#: 1.5 10*3/uL (ref 0.9–3.3)
LYMPH%: 40.9 % (ref 14.0–49.0)
MCH: 36.2 pg — AB (ref 27.2–33.4)
MCHC: 33.5 g/dL (ref 32.0–36.0)
MCV: 108.2 fL — ABNORMAL HIGH (ref 79.3–98.0)
MONO#: 0.2 10*3/uL (ref 0.1–0.9)
MONO%: 6.1 % (ref 0.0–14.0)
NEUT#: 1.8 10*3/uL (ref 1.5–6.5)
NEUT%: 48.5 % (ref 39.0–75.0)
Platelets: 223 10*3/uL (ref 140–400)
RBC: 3.01 10*6/uL — ABNORMAL LOW (ref 4.20–5.82)
RDW: 15.1 % — ABNORMAL HIGH (ref 11.0–14.6)
WBC: 3.7 10*3/uL — ABNORMAL LOW (ref 4.0–10.3)

## 2014-03-14 LAB — LACTATE DEHYDROGENASE (CC13): LDH: 159 U/L (ref 125–245)

## 2014-03-14 NOTE — Telephone Encounter (Signed)
gv adn printed appt sched and avs for pt for June adn July....MW adde BX

## 2014-03-14 NOTE — Progress Notes (Signed)
[place] [phone]   [phone]  CONSULT NOTE  REFERRING PHYSICIAN: Dr. Lajean Manes  REASON FOR CONSULTATION:  80 years old white male with questionable monoclonal gammopathy  HPI Eric [name] is a 80 y.o. male was past medical history significant for multiple medical problems including history of colon cancer, peripheral vascular disease, dyslipidemia, GERD, osteoporosis, coronary artery disease, stroke as well as history of persistent anemia. I see the patient 3 years ago for evaluation of anemia that was present at that time to be anemia of chronic disease plus/minus deficiency. He was started on treatment with Integra plus at that time with improvement in his hemoglobin and hematocrit but the patient stopped taking the medication for the last 2 years. He continues to have persistent anemia. He was seen recently by his primary care physician and repeat blood work showed the persistent anemia. Serum light chain performed on 02/12/2014 showed elevated free lambda light chain of 79.72, free kappa light chain 4.69 with a kappa/lambda ratio of 0.06. Quantitative immunoglobulins showed IgG 114, IgM less than 6 but I don't see the results for the IgA. Immunofixation showed monoclonal gammopathy. The patient was referred to me today for further evaluation and recommendation regarding these findings. He came to the clinic today accompanied by his wife and daughter. The patient is feeling fine with no specific complaints except for mild fatigue. He has no significant weight loss or night sweats. He has no chest pain, shortness of breath, cough or hemoptysis. He has no nausea or vomiting. He was seen recently by Dr. Donnetta Hutching for evaluation of his peripheral vascular disease and expected to have some intervention on 03/28/2014.  HPI  Past Medical History  Diagnosis Date  . Peripheral vascular disease   . Transient global amnesia   . Hypercholesterolemia   . Colon  cancer   . Mesenteric thrombosis     post colon resection  . Hypertension   . Macrocytic anemia   . Glaucoma   . Osteoporosis   . GERD (gastroesophageal reflux disease)   . Cholelithiasis   . Shingles 2009  . Pneumonia 02-2011    left lower lobe  . Stroke   . CAD (coronary artery disease)     Past Surgical History  Procedure Laterality Date  . Laminectomies      multiple  . Exploratory laparotomy      treat twisted intestines  . Colectomy      to remove adenoma  . Femoral artery stenting      Family History  Problem Relation Age of Onset  . Diabetes Mother   . Heart disease Mother   . Hypertension Mother   . Heart disease Father   . Hypertension Father   . Diabetes Father   . Hyperlipidemia Father   . Peripheral vascular disease Father   . Cancer Brother   . Diabetes Brother   . Hypertension Brother   . Heart disease Brother     before age 55  . Cancer Sister     Social History History  Substance Use Topics  . Smoking status: Former Smoker    Types: Cigarettes    Quit date: 09/28/1988  . Smokeless tobacco: Current User    Types: Chew  . Alcohol Use: Yes     Comment: rare    No Known Allergies  Current Outpatient Prescriptions  Medication Sig Dispense Refill  . aspirin 81 MG tablet Take 81 mg by mouth daily.      Marland Kitchen  clonazePAM (KLONOPIN) 0.5 MG tablet Take 0.5 mg by mouth daily.      Marland Kitchen esomeprazole (NEXIUM) 40 MG capsule Take 40 mg by mouth daily at 12 noon.      . hydrochlorothiazide (HYDRODIURIL) 25 MG tablet Take 25 mg by mouth daily.      Marland Kitchen lisinopril (PRINIVIL,ZESTRIL) 5 MG tablet Take 5 mg by mouth daily.      . Multiple Vitamins-Minerals (MULTIVITAMIN PO) Take by mouth daily.      . Omega-3 Fatty Acids (FISH OIL) 1000 MG CAPS Take 1,000 mg by mouth daily.      . simvastatin (ZOCOR) 20 MG tablet Take 20 mg by mouth daily.      Marland Kitchen BOOSTRIX 5-2.5-18.5 injection       . latanoprost (XALATAN) 0.005 % ophthalmic solution        No current  facility-administered medications for this visit.    Review of Systems  Constitutional: positive for fatigue Eyes: negative Ears, nose, mouth, throat, and face: negative Respiratory: negative Cardiovascular: negative Gastrointestinal: negative Genitourinary:negative Integument/breast: negative Hematologic/lymphatic: negative Musculoskeletal:negative Neurological: negative Behavioral/Psych: negative Endocrine: negative Allergic/Immunologic: negative  Physical Exam  CHY:IFOYD, healthy, no distress, well nourished and well developed SKIN: skin color, texture, turgor are normal, no rashes or significant lesions HEAD: Normocephalic, No masses, lesions, tenderness or abnormalities EYES: normal, PERRLA EARS: External ears normal, Canals clear OROPHARYNX:no exudate, no erythema and lips, buccal mucosa, and tongue normal  NECK: supple, no adenopathy, no JVD LYMPH:  no palpable lymphadenopathy, no hepatosplenomegaly LUNGS: clear to auscultation , and palpation HEART: regular rate & rhythm, no murmurs and no gallops ABDOMEN:abdomen soft, non-tender, normal bowel sounds and no masses or organomegaly BACK: Back symmetric, no curvature., No CVA tenderness EXTREMITIES:no joint deformities, effusion, or inflammation, no edema, no skin discoloration, no clubbing  NEURO: alert & oriented x 3 with fluent speech, no focal motor/sensory deficits  PERFORMANCE STATUS: ECOG 2  LABORATORY DATA: Lab Results  Component Value Date   WBC 4.9 04/27/2011   HGB 12.2* 04/27/2011   HCT 34.6* 04/27/2011   MCV 106.9* 04/27/2011   PLT 242 04/27/2011      Chemistry      Component Value Date/Time   NA 140 04/08/2011 0839   K 4.2 04/08/2011 0839   CL 101 04/08/2011 0839   CO2 27 04/08/2011 0839   BUN 15 04/08/2011 0839   CREATININE 0.94 04/08/2011 0839      Component Value Date/Time   CALCIUM 9.9 04/08/2011 0839   ALKPHOS 63 04/08/2011 0839   AST 19 04/08/2011 0839   ALT 15 04/08/2011 0839   BILITOT 0.3  04/08/2011 0839       RADIOGRAPHIC STUDIES: No results found.  ASSESSMENT: This is a very pleasant 80 years old white male with multiple medical conditions and history of anemia of chronic disease plus/minus deficiency and recently diagnosed monoclonal paraproteinemia. This is highly suspicious for monoclonal gammopathy but I cannot rule out multiple myeloma at this point.  PLAN: I had a lengthy discussion with the patient and his family today about his current disease status and further investigation to rule out multiple myeloma. I recommended for the patient to have repeat myeloma panel today. I will also arrange for him to have a bone marrow biopsy and aspirate performed in less than 2 weeks. I will also order a skeletal bone survey rule out any lytic lesions. I would see him back for followup visit in 3 weeks for evaluation and discussion of his lab and biopsy  results as well as imaging studies For the anemia, I advised the patient to resume his treatment with oral iron tablets, 1-2 tablets every day over the counter. The patient voices understanding of current disease status and treatment options and is in agreement with the current care plan. He was advised to call immediately if he has any concerning symptoms in the interval. All questions were answered. The patient knows to call the clinic with any problems, questions or concerns. We can certainly see the patient much sooner if necessary.  Thank you so much for allowing me to participate in the care of Eric [name]. I will continue to follow up the patient with you and assist in his care.  ADVANCE DIRECTIVE: The patient does not have advanced directives but he has healthcare power of attorney.  Disclaimer: This note was dictated with voice recognition software. Similar sounding words can inadvertently be transcribed and may not be corrected upon review.   MOHAMED,MOHAMED K. 03/14/2014, 9:14 AM

## 2014-03-14 NOTE — Patient Instructions (Signed)
Smokeless Tobacco Use  Smokeless tobacco is a loose, fine, or stringy tobacco. The tobacco is not smoked like a cigarette, but it is chewed or held in the lips or cheeks. It resembles tea and comes from the leaves of the tobacco plant. Smokeless tobacco is usually flavored, sweetened, or processed in some way. Although smokeless tobacco is not smoked into the lungs, its chemicals are absorbed through the membranes in the mouth and into the bloodstream. Its chemicals are also swallowed in saliva. The chemicals (nicotine and other toxins) are known to cause cancer. Smokeless tobacco contains up to 28 differentcarcinogens.  CAUSES  Nicotine is addictive. Smokeless tobacco contains nicotine, which is a stimulant. This stimulant can give you a "buzz" or altered state. People can become addicted to the feeling it delivers.   SYMPTOMS  Smokeless tobacco can cause health problems, including:   Bad breath.   Yellow-brown teeth.   Mouth sores.   Cracking and bleeding lips.   Gum disease, gum recession, and bone loss around the teeth.   Tooth decay.   Increased or irregular heart rate.   High blood pressure, heart disease, and stroke.   Cancer of the mouth, lips, tongue, pancreas, voice box (larynx), esophagus, colon, and bladder.   Precancerous lesion of the soft tissues of the mouth (leukoplakia).   Loss of your sense of taste.  TREATMENT  Talk with your caregiver about ways you can quit. Quitting tobacco is a good decision for your health. Nicotine is addictive, but several options are available to help you quit including:   Nicotine replacement therapy (gum or patch).   Support and cessation programs.  The following tips can help you quit:   Write down the reasons you would like to quit and look at them often.   Set a date during a low stress time to stop or cut back.   Ask family and friends for their support.   Remove all tobacco products from your home and work.    Replace the chewing tobacco with things like beef jerky, sunflower seeds, or shredded coconut.   Avoid situations that may make you want to chew tobacco.   Exercise and eat a healthy diet.   When you crave tobacco, distract yourself with drinking water, sugarless chewing gum, sugarless hard candy, exercising, or deep breathing.  HOME CARE INSTRUCTIONS   See your dentist for regular oral health exams every 6 months.   Follow up with your caregiver as recommended.  SEEK MEDICAL CARE OR DENTAL CARE IF:   You have bleeding or cracking lips, gums, or cheeks.   You have mouth sores, discolorations, or pain.   You have tooth pain.   You develop persistent irritation, burning, or sores in the mouth.   You have pain, tenderness, or numbness in the mouth.   You develop a lump, bumpy patch, or hardened skin inside the mouth.   The color changes inside your mouth (gray, white, or red spots).   You have difficulty chewing, swallowing, or speaking.  Document Released: 02/16/2011 Document Revised: 12/07/2011 Document Reviewed: 02/16/2011  ExitCare Patient Information 2014 [employer].

## 2014-03-16 ENCOUNTER — Ambulatory Visit (HOSPITAL_COMMUNITY): Payer: Medicare HMO

## 2014-03-16 ENCOUNTER — Ambulatory Visit (HOSPITAL_COMMUNITY)
Admission: RE | Admit: 2014-03-16 | Discharge: 2014-03-16 | Disposition: A | Discharge status: Home / Self Care | Payer: Medicare HMO | Source: Ambulatory Visit | Attending: Internal Medicine | Admitting: Internal Medicine

## 2014-03-16 DIAGNOSIS — M47817 Spondylosis without myelopathy or radiculopathy, lumbosacral region: Secondary | ICD-10-CM | POA: Insufficient documentation

## 2014-03-16 DIAGNOSIS — M51379 Other intervertebral disc degeneration, lumbosacral region without mention of lumbar back pain or lower extremity pain: Secondary | ICD-10-CM | POA: Insufficient documentation

## 2014-03-16 DIAGNOSIS — Z85038 Personal history of other malignant neoplasm of large intestine: Secondary | ICD-10-CM | POA: Insufficient documentation

## 2014-03-16 DIAGNOSIS — M79609 Pain in unspecified limb: Secondary | ICD-10-CM | POA: Insufficient documentation

## 2014-03-16 DIAGNOSIS — M5137 Other intervertebral disc degeneration, lumbosacral region: Secondary | ICD-10-CM | POA: Insufficient documentation

## 2014-03-16 DIAGNOSIS — M1120 Other chondrocalcinosis, unspecified site: Secondary | ICD-10-CM | POA: Insufficient documentation

## 2014-03-16 DIAGNOSIS — D472 Monoclonal gammopathy: Secondary | ICD-10-CM | POA: Insufficient documentation

## 2014-03-16 DIAGNOSIS — M8448XA Pathological fracture, other site, initial encounter for fracture: Secondary | ICD-10-CM | POA: Insufficient documentation

## 2014-03-16 DIAGNOSIS — I700 Atherosclerosis of aorta: Secondary | ICD-10-CM | POA: Insufficient documentation

## 2014-03-16 DIAGNOSIS — M47814 Spondylosis without myelopathy or radiculopathy, thoracic region: Secondary | ICD-10-CM | POA: Insufficient documentation

## 2014-03-16 LAB — IGG, IGA, IGM
IGA: 318 mg/dL (ref 68–379)
IGG (IMMUNOGLOBIN G), SERUM: 1260 mg/dL (ref 650–1600)

## 2014-03-16 LAB — KAPPA/LAMBDA LIGHT CHAINS
KAPPA LAMBDA RATIO: 0.01 — AB (ref 0.26–1.65)
Kappa free light chain: 0.12 mg/dL — ABNORMAL LOW (ref 0.33–1.94)
LAMBDA FREE LGHT CHN: 8.55 mg/dL — AB (ref 0.57–2.63)

## 2014-03-16 LAB — BETA 2 MICROGLOBULIN, SERUM: Beta-2 Microglobulin: 4.6 mg/L — ABNORMAL HIGH (ref <=2.51)

## 2014-03-20 LAB — UPEP/TP, 24-HR URINE
Collection Interval: 24 hours
Total Volume, Urine: 1400 mL

## 2014-03-20 LAB — UIFE/LIGHT CHAINS/TP QN, 24-HR UR
ALPHA 2 UR: DETECTED — AB
Albumin, U: DETECTED
Alpha 1, Urine: DETECTED — AB
BETA UR: DETECTED — AB
FREE KAPPA LT CHAINS, UR: 0.47 mg/dL (ref 0.14–2.42)
Free Kappa/Lambda Ratio: 0.12 ratio — ABNORMAL LOW (ref 2.04–10.37)
Free Lambda Excretion/Day: 54.74 mg/d
Free Lambda Lt Chains,Ur: 3.91 mg/dL — ABNORMAL HIGH (ref 0.02–0.67)
Free Lt Chn Excr Rate: 6.58 mg/d
Gamma Globulin, Urine: DETECTED — AB
Time: 24 hours
Total Protein, Urine-Ur/day: 69 mg/d (ref 10–140)
Total Protein, Urine: 4.9 mg/dL
Volume, Urine: 1400 mL

## 2014-03-22 ENCOUNTER — Encounter (HOSPITAL_COMMUNITY): Payer: Self-pay | Admitting: Pharmacy Technician

## 2014-03-23 ENCOUNTER — Telehealth: Payer: Self-pay | Admitting: Internal Medicine

## 2014-03-23 ENCOUNTER — Other Ambulatory Visit (HOSPITAL_COMMUNITY)
Admission: RE | Admit: 2014-03-23 | Discharge: 2014-03-23 | Disposition: A | Discharge status: Home / Self Care | Payer: Medicare HMO | Source: Ambulatory Visit | Attending: Internal Medicine | Admitting: Internal Medicine

## 2014-03-23 ENCOUNTER — Encounter: Payer: Self-pay | Admitting: Internal Medicine

## 2014-03-23 ENCOUNTER — Other Ambulatory Visit: Payer: Commercial Managed Care - HMO

## 2014-03-23 ENCOUNTER — Ambulatory Visit (HOSPITAL_BASED_OUTPATIENT_CLINIC_OR_DEPARTMENT_OTHER): Payer: Commercial Managed Care - HMO | Admitting: Internal Medicine

## 2014-03-23 VITALS — BP 160/68 | HR 56 | Temp 98.6°F | Resp 18

## 2014-03-23 DIAGNOSIS — D472 Monoclonal gammopathy: Secondary | ICD-10-CM | POA: Insufficient documentation

## 2014-03-23 DIAGNOSIS — D539 Nutritional anemia, unspecified: Secondary | ICD-10-CM | POA: Insufficient documentation

## 2014-03-23 DIAGNOSIS — C9030 Solitary plasmacytoma not having achieved remission: Secondary | ICD-10-CM | POA: Insufficient documentation

## 2014-03-23 DIAGNOSIS — D72819 Decreased white blood cell count, unspecified: Secondary | ICD-10-CM | POA: Insufficient documentation

## 2014-03-23 LAB — CBC
HEMATOCRIT: 32.2 % — AB (ref 39.0–52.0)
Hemoglobin: 10.8 g/dL — ABNORMAL LOW (ref 13.0–17.0)
MCH: 36 pg — ABNORMAL HIGH (ref 26.0–34.0)
MCHC: 33.5 g/dL (ref 30.0–36.0)
MCV: 107.3 fL — AB (ref 78.0–100.0)
PLATELETS: 207 10*3/uL (ref 150–400)
RBC: 3 MIL/uL — AB (ref 4.22–5.81)
RDW: 14.9 % (ref 11.5–15.5)
WBC: 3.5 10*3/uL — AB (ref 4.0–10.5)

## 2014-03-23 LAB — BONE MARROW EXAM

## 2014-03-23 NOTE — Progress Notes (Signed)
Patient tolerated bone marrow biopsy without any problems or complications. Patient kept 30 mins afterwards per Dr. Julien Nordmann. VS taken and charted. Biopsy site dressing clean and intact. Patient discharged home with wife and daughter. Eric [name]

## 2014-03-23 NOTE — Patient Instructions (Signed)
Bone Marrow Aspiration, Bone Marrow Biopsy  Care After  Read the instructions outlined below and refer to this sheet in the next few weeks. These discharge instructions provide you with general information on caring for yourself after you leave the hospital. Your caregiver may also give you specific instructions. While your treatment has been planned according to the most current medical practices available, unavoidable complications occasionally occur. If you have any problems or questions after discharge, call your caregiver.  FINDING OUT THE RESULTS OF YOUR TEST  Not all test results are available during your visit. If your test results are not back during the visit, make an appointment with your caregiver to find out the results. Do not assume everything is normal if you have not heard from your caregiver or the medical facility. It is important for you to follow up on all of your test results.   HOME CARE INSTRUCTIONS   You have had sedation and may be sleepy or dizzy. Your thinking may not be as clear as usual. For the next 24 hours:  · Only take over-the-counter or prescription medicines for pain, discomfort, and or fever as directed by your caregiver.  · Do not drink alcohol.  · Do not smoke.  · Do not drive.  · Do not make important legal decisions.  · Do not operate heavy machinery.  · Do not care for small children by yourself.  · Keep your dressing clean and dry. You may replace dressing with a bandage after 24 hours.  · You may take a bath or shower after 24 hours.  · Use an ice pack for 20 minutes every 2 hours while awake for pain as needed.  SEEK MEDICAL CARE IF:   · There is redness, swelling, or increasing pain at the biopsy site.  · There is pus coming from the biopsy site.  · There is drainage from a biopsy site lasting longer than one day.  · An unexplained oral temperature above 102° F (38.9° C) develops.  SEEK IMMEDIATE MEDICAL CARE IF:   · You develop a rash.  · You have difficulty  breathing.  · You develop any reaction or side effects to medications given.  Document Released: 04/03/2005 Document Revised: 12/07/2011 Document Reviewed: 09/11/2008  Exit[employer]® Patient Information ©2015 [employer]. This information is not intended to replace advice given to you by your health care provider. Make sure you discuss any questions you have with your health care provider.

## 2014-03-23 NOTE — Progress Notes (Signed)
Bone Marrow Biopsy and Aspiration Procedure Note   Informed consent was obtained and potential risks including bleeding, infection and pain were reviewed with the patient.   Posterior iliac crest(s) prepped with Betadine.   Lidocaine 2% local anesthesia infiltrated into the subcutaneous tissue.  Right bone marrow biopsy and right bone marrow aspirate was obtained.   The procedure was tolerated well and there were no complications.  Specimens sent for: routine histopathologic stains and sectioning, flow cytometry, cytogenetics and molecular analysis  Physician: Eilleen Kempf.

## 2014-03-23 NOTE — Telephone Encounter (Signed)
s.w pt wife and advised on July 8 appt moved to earlier time...Marland Kitchenok and aware

## 2014-03-27 MED ORDER — SODIUM CHLORIDE 0.9 % IV SOLN
INTRAVENOUS | Status: DC
  Administered 2014-03-28: 100 mL/h via INTRAVENOUS

## 2014-03-28 ENCOUNTER — Encounter (HOSPITAL_COMMUNITY)
Admission: RE | Disposition: A | Discharge status: Home / Self Care | Payer: Commercial Managed Care - HMO | Source: Ambulatory Visit | Attending: Vascular Surgery

## 2014-03-28 ENCOUNTER — Ambulatory Visit (HOSPITAL_COMMUNITY)
Admission: RE | Admit: 2014-03-28 | Discharge: 2014-03-28 | Disposition: A | Discharge status: Home / Self Care | Payer: Medicare HMO | Source: Ambulatory Visit | Attending: Vascular Surgery | Admitting: Vascular Surgery

## 2014-03-28 DIAGNOSIS — Z7982 Long term (current) use of aspirin: Secondary | ICD-10-CM | POA: Insufficient documentation

## 2014-03-28 DIAGNOSIS — I2510 Atherosclerotic heart disease of native coronary artery without angina pectoris: Secondary | ICD-10-CM | POA: Insufficient documentation

## 2014-03-28 DIAGNOSIS — K219 Gastro-esophageal reflux disease without esophagitis: Secondary | ICD-10-CM | POA: Insufficient documentation

## 2014-03-28 DIAGNOSIS — I70219 Atherosclerosis of native arteries of extremities with intermittent claudication, unspecified extremity: Secondary | ICD-10-CM | POA: Insufficient documentation

## 2014-03-28 DIAGNOSIS — E7800 Pure hypercholesterolemia, unspecified: Secondary | ICD-10-CM | POA: Insufficient documentation

## 2014-03-28 DIAGNOSIS — I10 Essential (primary) hypertension: Secondary | ICD-10-CM | POA: Insufficient documentation

## 2014-03-28 DIAGNOSIS — Z87891 Personal history of nicotine dependence: Secondary | ICD-10-CM | POA: Insufficient documentation

## 2014-03-28 DIAGNOSIS — Z85038 Personal history of other malignant neoplasm of large intestine: Secondary | ICD-10-CM | POA: Insufficient documentation

## 2014-03-28 HISTORY — PX: ABDOMINAL AORTAGRAM: SHX5454

## 2014-03-28 LAB — POCT I-STAT, CHEM 8
BUN: 15 mg/dL (ref 6–23)
CALCIUM ION: 1.32 mmol/L — AB (ref 1.13–1.30)
Chloride: 100 mEq/L (ref 96–112)
Creatinine, Ser: 0.9 mg/dL (ref 0.50–1.35)
Glucose, Bld: 94 mg/dL (ref 70–99)
HEMATOCRIT: 37 % — AB (ref 39.0–52.0)
Hemoglobin: 12.6 g/dL — ABNORMAL LOW (ref 13.0–17.0)
Potassium: 4.1 mEq/L (ref 3.7–5.3)
Sodium: 142 mEq/L (ref 137–147)
TCO2: 29 mmol/L (ref 0–100)

## 2014-03-28 SURGERY — ABDOMINAL AORTAGRAM
Anesthesia: LOCAL

## 2014-03-28 NOTE — Interval H&P Note (Signed)
History and Physical Interval Note:  03/28/2014 12:10 PM  Eric [name]  has presented today for surgery, with the diagnosis of pvd  The various methods of treatment have been discussed with the patient and family. After consideration of risks, benefits and other options for treatment, the patient has consented to  Procedure(s): ABDOMINAL AORTAGRAM (N/A) as a surgical intervention .  The patient's history has been reviewed, patient examined, no change in status, stable for surgery.  I have reviewed the patient's chart and labs.  Questions were answered to the patient's satisfaction.     EARLY, TODD

## 2014-03-28 NOTE — Op Note (Signed)
    OPERATIVE REPORT  DATE OF SURGERY: 03/28/2014  PATIENT: Eric [name], 80 y.o. male MRN: [id number]  DOB: May 23, 1930  PRE-OPERATIVE DIAGNOSIS: Bilateral extremity claudication left greater than right  POST-OPERATIVE DIAGNOSIS:  Same  PROCEDURE: Aortogram with bilateral extremity runoff the right groin approach with ultrasound visualization  SURGEON:  Curt Jews, M.D.  PHYSICIAN ASSISTANT: Nurse  ANESTHESIA:  1% lidocaine  EBL: Minimal ml     BLOOD ADMINISTERED: None  DRAINS: None  SPECIMEN: None  COUNTS CORRECT:  YES  PLAN OF CARE: Holding area stable   PATIENT DISPOSITION:  PACU - hemodynamically stable  PROCEDURE DETAILS: The patient was taken to the precut cath in place supervision an area of both groins are prepped and draped in usual sterile fashion. Using local anesthesia and a single wall puncture the common femoral artery was accessed with an 18-gauge needle. A guidewire was passed up to the level of the suprarenal aorta. There was extensive calcification noted on the SonoSite visualization of the left common femoral artery. The patient had a prior placed skin kissing stents in the common iliac arteries and the guidewire passed easily through this. 5 French sheath was passed over the guidewire and a pigtail catheter is positioned above the level of the renal arteries. AP projection was undertaken which showed widely patent single renal arteries bilaterally. There was no evidence of stenosis in the infrarenal aorta. The prior placed kissing common iliac stents were widely patent with no evidence of recurrent stenosis. The external iliac and internal iliac arteries were patent without stenosis. There was moderate to severe stenosis in the common femoral artery on the left. There is no significant stenosis in the right common femoral artery. Runoff projections were then undertaken and this revealed patent profunda and superficial femoral arteries bilaterally. There was mild  irregularity in the superficial femoral artery with no high-grade stenosis. Popliteal arteries were patent and there was three-vessel runoff bilaterally  Findings #1 patent aorta iliac segments with patent prior placed bilateral common iliac artery stents #2 moderate to severe stenosis in the left common femoral artery #3 mild irregularity in the superficial femoral artery bilaterally with the popliteal artery and three-vessel runoff   Curt Jews, M.D. 03/28/2014 2:02 PM

## 2014-03-28 NOTE — H&P (View-Only) (Signed)
Patient name: Eric [name] MRN: [id number] DOB: 08/10/1930 Sex: male   Referred by: Felipa Eth  Reason for referral:  Chief Complaint  Patient presents with  . New Evaluation    L leg worsening claudication     HISTORY OF PRESENT ILLNESS: Patient has today for evaluation of left leg claudication this is mild in the right as well. He is here today with his wife. He remains in very good health and is quite active. He denies any rest pain and denies any tissue loss. He reports that he remains active in bowling and and this yard and this is quite limiting to him. He does have a remote history of left leg stenting in 1999. I'm trying to obtain past records to see exactly what was treated. He had very good long-term result reports this is now become intolerable claudication.  Past Medical History  Diagnosis Date  . Peripheral vascular disease   . Transient global amnesia   . Hypercholesterolemia   . Colon cancer   . Mesenteric thrombosis     post colon resection  . Hypertension   . Macrocytic anemia   . Glaucoma   . Osteoporosis   . GERD (gastroesophageal reflux disease)   . Cholelithiasis   . Shingles 2009  . Pneumonia 02-2011    left lower lobe  . Stroke   . CAD (coronary artery disease)     Past Surgical History  Procedure Laterality Date  . Laminectomies      multiple  . Exploratory laparotomy      treat twisted intestines  . Colectomy      to remove adenoma  . Femoral artery stenting      History   Social History  . Marital Status: Single    Spouse Name: N/A    Number of Children: N/A  . Years of Education: N/A   Occupational History  . Not on file.   Social History Main Topics  . Smoking status: Former Smoker    Types: Cigarettes    Quit date: 09/28/1988  . Smokeless tobacco: Current User    Types: Chew  . Alcohol Use: Yes     Comment: rare  . Drug Use: No  . Sexual Activity: Not on file   Other Topics Concern  . Not on file   Social History  Narrative  . No narrative on file    Family History  Problem Relation Age of Onset  . Diabetes Mother   . Heart disease Mother   . Hypertension Mother   . Heart disease Father   . Hypertension Father   . Diabetes Father   . Hyperlipidemia Father   . Peripheral vascular disease Father   . Cancer Brother   . Diabetes Brother   . Hypertension Brother   . Heart disease Brother     before age 71  . Cancer Sister     Allergies as of 03/13/2014  . (No Known Allergies)    Current Outpatient Prescriptions on File Prior to Visit  Medication Sig Dispense Refill  . aspirin 81 MG tablet Take 81 mg by mouth daily.      . calcium citrate-vitamin D (CITRACAL+D) 315-200 MG-UNIT per tablet Take 1 tablet by mouth 2 (two) times daily.      . clonazePAM (KLONOPIN) 0.5 MG tablet Take 0.5 mg by mouth daily.      Marland Kitchen esomeprazole (NEXIUM) 40 MG capsule Take 40 mg by mouth daily at 12 noon.      Marland Kitchen  finasteride (PROSCAR) 5 MG tablet Take 5 mg by mouth daily.      . Flaxseed, Linseed, (FLAX SEED OIL PO) Take 1,000 mg by mouth daily.      . hydrochlorothiazide (HYDRODIURIL) 25 MG tablet Take 25 mg by mouth daily.      Marland Kitchen lisinopril (PRINIVIL,ZESTRIL) 5 MG tablet Take 5 mg by mouth daily.      . Multiple Vitamins-Minerals (MULTIVITAMIN PO) Take by mouth daily.      . Omega-3 Fatty Acids (FISH OIL) 1000 MG CAPS Take 1,000 mg by mouth daily.      . simvastatin (ZOCOR) 20 MG tablet Take 20 mg by mouth daily.      . tamsulosin (FLOMAX) 0.4 MG CAPS capsule Take 0.4 mg by mouth daily.       No current facility-administered medications on file prior to visit.     REVIEW OF SYSTEMS:  Positives indicated with an "X"  CARDIOVASCULAR:  [ ]  chest pain   [ ]  chest pressure   [ ]  palpitations   [ ]  orthopnea   [ x] dyspnea on exertion   [x ] claudication   [ ]  rest pain   [ ]  DVT   [ ]  phlebitis PULMONARY:   [ ]  productive cough   [ ]  asthma   [ ]  wheezing NEUROLOGIC:   [x ] weakness  [x ] paresthesias  [ ]   aphasia  [ ]  amaurosis  [ ]  dizziness HEMATOLOGIC:   [ ]  bleeding problems   [ ]  clotting disorders MUSCULOSKELETAL:  [ ]  joint pain   [ ]  joint swelling GASTROINTESTINAL: [ ]   blood in stool  [ ]   hematemesis GENITOURINARY:  [ ]   dysuria  [ ]   hematuria PSYCHIATRIC:  [ ]  history of major depression INTEGUMENTARY:  [ ]  rashes  [ ]  ulcers CONSTITUTIONAL:  [ ]  fever   [ ]  chills  PHYSICAL EXAMINATION:  General: The patient is a well-nourished male, in no acute distress. Vital signs are BP 145/58  Pulse 55  Ht 5\' 6"  (1.676 m)  Wt 140 lb 14.4 oz (63.912 kg)  BMI 22.75 kg/m2  SpO2 100% Pulmonary: There is a good air exchange bilaterally without wheezing or rales. Abdomen: Soft and non-tender with normal pitch bowel sounds. Musculoskeletal: There are no major deformities.  There is no significant extremity pain. Neurologic: No focal weakness or paresthesias are detected, Skin: There are no ulcer or rashes noted. Psychiatric: The patient has normal affect. Cardiovascular: There is a regular rate and rhythm without significant murmur appreciated. Carotid arteries without bruits bilaterally He does have 2+ radial pulses and palpable femoral pulses bilaterally. He does have a palpable popliteal pulses which are 1+. I do not palpate pedal pulses   [facility] Vascular Lab Studies:  Ordered and Independently Reviewed these reveal normal ankle arm indices bilaterally with triphasic right dorsalis pedis and triphasic left posterior tibial signals. Duplex of his lower extremities reveal some elevated velocities in his right common femoral artery and the superficial femoral arteries bilaterally. Flow in the left common femoral artery is turbulent with biphasic flow  Impression and Plan:  Had a long discussion with the patient and his wife present. Extremities have any evidence of limb threatening ischemia. His symptoms are quite consistent with arterial claudication symptoms. I have recommended  arteriography to further evaluation. He may be a candidate for H. plasty and stenting for improvement of his walking ability. He reports that he is unable to tolerate this level of  claudication and wished to proceed as soon as possible. We have scheduled this for July 1 as an outpatient at the hospital. The patient understands that if we can intervene we will at this same setting    Berda Shelvin [facility] Office: [phone]

## 2014-03-28 NOTE — Discharge Instructions (Signed)

## 2014-04-03 LAB — CHROMOSOME ANALYSIS, BONE MARROW

## 2014-04-03 LAB — TISSUE HYBRIDIZATION (BONE MARROW)-NCBH

## 2014-04-04 ENCOUNTER — Other Ambulatory Visit: Payer: Self-pay | Admitting: *Deleted

## 2014-04-04 ENCOUNTER — Encounter: Payer: Self-pay | Admitting: Internal Medicine

## 2014-04-04 ENCOUNTER — Ambulatory Visit (HOSPITAL_BASED_OUTPATIENT_CLINIC_OR_DEPARTMENT_OTHER): Payer: Commercial Managed Care - HMO | Admitting: Internal Medicine

## 2014-04-04 ENCOUNTER — Telehealth: Payer: Self-pay | Admitting: Internal Medicine

## 2014-04-04 VITALS — BP 119/53 | HR 66 | Temp 97.9°F | Resp 18 | Ht 65.0 in | Wt 142.9 lb

## 2014-04-04 DIAGNOSIS — C9000 Multiple myeloma not having achieved remission: Secondary | ICD-10-CM

## 2014-04-04 DIAGNOSIS — I10 Essential (primary) hypertension: Secondary | ICD-10-CM

## 2014-04-04 DIAGNOSIS — M810 Age-related osteoporosis without current pathological fracture: Secondary | ICD-10-CM

## 2014-04-04 NOTE — Progress Notes (Signed)
[facility] [phone]   [phone]  OFFICE PROGRESS NOTE  Mathews Argyle, MD [address] [place] [address]  DIAGNOSIS: Multiple myeloma diagnosed in June of 2015  PRIOR THERAPY: None  CURRENT THERAPY: Observation.  INTERVAL HISTORY: Eric [name] 80 y.o. male returns to the clinic today for followup visit accompanied by his wife and daughter. The patient is feeling fine today with no specific complaints. He still very active and does a lot of yard work. He denied having any significant fatigue or weakness. He denied having any weight loss or night sweats. He has no chest pain, shortness of breath, cough or hemoptysis. He had several studies performed recently include a skeletal bone survey in addition to bone marrow biopsy and aspirate and he is here today for evaluation and discussion of his imaging and biopsy results as well as recommendation regarding treatment of his condition.  MEDICAL HISTORY: Past Medical History  Diagnosis Date  . Peripheral vascular disease   . Transient global amnesia   . Hypercholesterolemia   . Colon cancer   . Mesenteric thrombosis     post colon resection  . Hypertension   . Macrocytic anemia   . Glaucoma   . Osteoporosis   . GERD (gastroesophageal reflux disease)   . Cholelithiasis   . Shingles 2009  . Pneumonia 02-2011    left lower lobe  . Stroke   . CAD (coronary artery disease)     ALLERGIES:  has No Known Allergies.  MEDICATIONS:  Current Outpatient Prescriptions  Medication Sig Dispense Refill  . alendronate (FOSAMAX) 40 MG tablet Take 40 mg by mouth every 7 (seven) days. Take with a full glass of water on an empty stomach.      Marland Kitchen aspirin 81 MG tablet Take 81 mg by mouth daily.      . Calcium Citrate (CITRACAL PO) Take 1 tablet by mouth daily.      . clonazePAM (KLONOPIN) 0.5 MG tablet Take 0.5 mg by mouth daily.      Marland Kitchen esomeprazole (NEXIUM) 40 MG capsule Take 40 mg  by mouth daily at 12 noon.      . hydrochlorothiazide (HYDRODIURIL) 25 MG tablet Take 25 mg by mouth daily.      Marland Kitchen latanoprost (XALATAN) 0.005 % ophthalmic solution Place 1 drop into both eyes at bedtime.       Marland Kitchen lisinopril (PRINIVIL,ZESTRIL) 5 MG tablet Take 5 mg by mouth daily.      . Multiple Vitamins-Minerals (MULTIVITAMIN PO) Take 1 tablet by mouth daily.       . Omega-3 Fatty Acids (FISH OIL) 1000 MG CAPS Take 1,000 mg by mouth daily.      . simvastatin (ZOCOR) 20 MG tablet Take 20 mg by mouth daily.       No current facility-administered medications for this visit.    SURGICAL HISTORY:  Past Surgical History  Procedure Laterality Date  . Laminectomies      multiple  . Exploratory laparotomy      treat twisted intestines  . Colectomy      to remove adenoma  . Femoral artery stenting      REVIEW OF SYSTEMS:  Constitutional: negative Eyes: negative Ears, nose, mouth, throat, and face: negative Respiratory: negative Cardiovascular: negative Gastrointestinal: negative Genitourinary:negative Integument/breast: negative Hematologic/lymphatic: negative Musculoskeletal:negative Neurological: negative Behavioral/Psych: negative Endocrine: negative Allergic/Immunologic: negative   PHYSICAL EXAMINATION: General appearance: alert, cooperative and no distress Head: Normocephalic, without obvious  abnormality, atraumatic Neck: no adenopathy, no JVD, supple, symmetrical, trachea midline and thyroid not enlarged, symmetric, no tenderness/mass/nodules Lymph nodes: Cervical, supraclavicular, and axillary nodes normal. Resp: clear to auscultation bilaterally Back: symmetric, no curvature. ROM normal. No CVA tenderness. Cardio: regular rate and rhythm, S1, S2 normal, no murmur, click, rub or gallop GI: soft, non-tender; bowel sounds normal; no masses,  no organomegaly Extremities: extremities normal, atraumatic, no cyanosis or edema Neurologic: Alert and oriented X 3, normal strength  and tone. Normal symmetric reflexes. Normal coordination and gait  ECOG PERFORMANCE STATUS: 0 - Asymptomatic  Blood pressure 119/53, pulse 66, temperature 97.9 F (36.6 C), temperature source Oral, resp. rate 18, height $RemoveBe'5\' 5"'XuUqKnJop$  (1.651 m), weight 142 lb 14.4 oz (64.819 kg), SpO2 100.00%.  LABORATORY DATA: Lab Results  Component Value Date   WBC 3.5* 03/23/2014   HGB 12.6* 03/28/2014   HCT 37.0* 03/28/2014   MCV 107.3* 03/23/2014   PLT 207 03/23/2014      Chemistry      Component Value Date/Time   NA 142 03/28/2014 1146   NA 142 03/14/2014 0949   K 4.1 03/28/2014 1146   K 4.3 03/14/2014 0949   CL 100 03/28/2014 1146   CO2 29 03/14/2014 0949   CO2 27 04/08/2011 0839   BUN 15 03/28/2014 1146   BUN 17.2 03/14/2014 0949   CREATININE 0.90 03/28/2014 1146   CREATININE 1.0 03/14/2014 0949      Component Value Date/Time   CALCIUM 9.8 03/14/2014 0949   CALCIUM 9.9 04/08/2011 0839   ALKPHOS 76 03/14/2014 0949   ALKPHOS 63 04/08/2011 0839   AST 15 03/14/2014 0949   AST 19 04/08/2011 0839   ALT 13 03/14/2014 0949   ALT 15 04/08/2011 0839   BILITOT 0.22 03/14/2014 0949   BILITOT 0.3 04/08/2011 0839       RADIOGRAPHIC STUDIES: Dg Bone Survey Met  03/16/2014   CLINICAL DATA:  Monoclonal gammopathy of unknown significance, question multiple myeloma. Bilateral leg pain. Remote history of colon cancer.  EXAM: METASTATIC BONE SURVEY, 24 views  COMPARISON:  Multiple exams, including 04/12/2006  FINDINGS: Chronic T7 and T8 compression fractures. Thoracic spondylosis and aortic atherosclerosis. Old granulomatous disease. Common iliac artery stents. Lumbar spondylosis and degenerative disc disease. Poor definition of the L4-5 intervertebral disc space, query interbody fusion.  Meniscal chondrocalcinosis in the knees. There are some subtle lucencies in the skull but I fine these more characteristic of arachnoid granulations or similar benign structures rather than myeloma.  IMPRESSION: 1. No findings of myeloma.    Electronically Signed   By: Sherryl Barters M.D.   On: 03/16/2014 09:41   BONE MARROW REPORT FINAL DIAGNOSIS Diagnosis Bone Marrow, Aspirate,Biopsy, and Clot, right iliac - HYPERCELLULAR BONE MARROW FOR AGE WITH PLASMA CELL NEOPLASM. PERIPHERAL BLOOD: - MACROCYTIC ANEMIA. - LEUKOPENIA. Diagnosis Note The bone marrow is variably cellular but generally hypercellular for age with increased number of plasma cells representing 21% of all cells in the aspirate associated with interstitial infiltrates and numerous variably sized clusters in the clot and biopsy sections. Immunohistochemical stains show that the plasma cells display lambda light chain restriction consistent with plasma cell neoplasm. The background shows trilineage hematopiesis with nonspecific changes. Correlation with cytogenetic studies is recommended. (BNS:ecj Apr 18, 2014) Susanne Greenhouse MD Pathologist, Electronic Signature (Case signed 18-Apr-2014)  ASSESSMENT AND PLAN: This is a very pleasant 80 years old white male recently diagnosed with multiple myeloma but currently asymptomatic with no lytic lesions or declined of his renal function. He  has mild anemia but does not complain of any significant fatigue and weakness. I had a lengthy discussion with the patient and his family today about his current disease status and treatment options. Since the patient is currently asymptomatic, I gave him the option of continuous observation and close monitoring versus starting systemic treatment with either Revlimid and Decadron or Velcade and Decadron. After the discussion of these options the patient and his family opted to continue on observation for now. I would see him back for followup visit in 3 months with repeat myeloma panel. I would consider the patient for treatment if he becomes symptomatic or he has significant worsening of his disease. He was advised to call immediately if he has any concerning symptoms in the interval. The  patient voices understanding of current disease status and treatment options and is in agreement with the current care plan.  All questions were answered. The patient knows to call the clinic with any problems, questions or concerns. We can certainly see the patient much sooner if necessary.  I spent 15 minutes counseling the patient face to face. The total time spent in the appointment was 25 minutes.  Disclaimer: This note was dictated with voice recognition software. Similar sounding words can inadvertently be transcribed and may not be corrected upon review.

## 2014-04-04 NOTE — Telephone Encounter (Signed)
gv and printed appt sched and avs for pt for Sept and OCT °

## 2014-04-06 MED FILL — Sodium Chloride IV Soln 0.9%: INTRAVENOUS | Qty: 1000 | Status: AC

## 2014-04-10 ENCOUNTER — Encounter (HOSPITAL_COMMUNITY): Payer: Self-pay

## 2014-04-12 ENCOUNTER — Other Ambulatory Visit: Payer: Self-pay

## 2014-04-13 ENCOUNTER — Encounter: Payer: Self-pay | Admitting: Internal Medicine

## 2014-04-13 ENCOUNTER — Other Ambulatory Visit: Payer: Self-pay

## 2014-04-19 ENCOUNTER — Encounter (HOSPITAL_COMMUNITY)
Admission: RE | Admit: 2014-04-19 | Discharge: 2014-04-19 | Disposition: A | Discharge status: Home / Self Care | Payer: Medicare HMO | Source: Ambulatory Visit | Attending: Vascular Surgery | Admitting: Vascular Surgery

## 2014-04-19 ENCOUNTER — Encounter (HOSPITAL_COMMUNITY): Payer: Self-pay

## 2014-04-19 ENCOUNTER — Encounter (HOSPITAL_COMMUNITY)
Admission: RE | Admit: 2014-04-19 | Discharge: 2014-04-19 | Disposition: A | Discharge status: Home / Self Care | Payer: Medicare HMO | Source: Ambulatory Visit | Attending: Anesthesiology | Admitting: Anesthesiology

## 2014-04-19 DIAGNOSIS — Z01812 Encounter for preprocedural laboratory examination: Secondary | ICD-10-CM | POA: Diagnosis not present

## 2014-04-19 DIAGNOSIS — Z01810 Encounter for preprocedural cardiovascular examination: Secondary | ICD-10-CM | POA: Insufficient documentation

## 2014-04-19 DIAGNOSIS — Z01818 Encounter for other preprocedural examination: Secondary | ICD-10-CM | POA: Diagnosis not present

## 2014-04-19 HISTORY — DX: Unspecified malignant neoplasm of skin, unspecified: C44.90

## 2014-04-19 HISTORY — DX: Unspecified dementia, unspecified severity, without behavioral disturbance, psychotic disturbance, mood disturbance, and anxiety: F03.90

## 2014-04-19 LAB — CBC
HEMATOCRIT: 33.1 % — AB (ref 39.0–52.0)
Hemoglobin: 10.9 g/dL — ABNORMAL LOW (ref 13.0–17.0)
MCH: 35.2 pg — AB (ref 26.0–34.0)
MCHC: 32.9 g/dL (ref 30.0–36.0)
MCV: 106.8 fL — ABNORMAL HIGH (ref 78.0–100.0)
Platelets: 205 10*3/uL (ref 150–400)
RBC: 3.1 MIL/uL — ABNORMAL LOW (ref 4.22–5.81)
RDW: 15.4 % (ref 11.5–15.5)
WBC: 4.5 10*3/uL (ref 4.0–10.5)

## 2014-04-19 LAB — ABO/RH: ABO/RH(D): A POS

## 2014-04-19 LAB — PROTIME-INR
INR: 0.97 (ref 0.00–1.49)
PROTHROMBIN TIME: 12.9 s (ref 11.6–15.2)

## 2014-04-19 LAB — SURGICAL PCR SCREEN
MRSA, PCR: NEGATIVE
Staphylococcus aureus: NEGATIVE

## 2014-04-19 LAB — COMPREHENSIVE METABOLIC PANEL
ALT: 16 U/L (ref 0–53)
ANION GAP: 13 (ref 5–15)
AST: 18 U/L (ref 0–37)
Albumin: 3.5 g/dL (ref 3.5–5.2)
Alkaline Phosphatase: 77 U/L (ref 39–117)
BUN: 17 mg/dL (ref 6–23)
CO2: 27 mEq/L (ref 19–32)
CREATININE: 0.84 mg/dL (ref 0.50–1.35)
Calcium: 9.6 mg/dL (ref 8.4–10.5)
Chloride: 102 mEq/L (ref 96–112)
GFR calc Af Amer: >90 mL/min (ref >90)
GFR calc non Af Amer: 79 mL/min — ABNORMAL LOW (ref >90)
Glucose, Bld: 93 mg/dL (ref 70–99)
Potassium: 4.2 mEq/L (ref 3.7–5.3)
Sodium: 142 mEq/L (ref 137–147)
TOTAL PROTEIN: 6.7 g/dL (ref 6.0–8.3)

## 2014-04-19 LAB — URINALYSIS, ROUTINE W REFLEX MICROSCOPIC
BILIRUBIN URINE: NEGATIVE
Glucose, UA: NEGATIVE mg/dL
HGB URINE DIPSTICK: NEGATIVE
KETONES UR: NEGATIVE mg/dL
Leukocytes, UA: NEGATIVE
Nitrite: NEGATIVE
Protein, ur: NEGATIVE mg/dL
SPECIFIC GRAVITY, URINE: 1.008 (ref 1.005–1.030)
UROBILINOGEN UA: 0.2 mg/dL (ref 0.0–1.0)
pH: 6.5 (ref 5.0–8.0)

## 2014-04-19 LAB — TYPE AND SCREEN
ABO/RH(D): A POS
Antibody Screen: NEGATIVE

## 2014-04-19 LAB — APTT: aPTT: 29 seconds (ref 24–37)

## 2014-04-19 NOTE — Progress Notes (Signed)
Eric [name] reported that he uses Eric [name]'s pain patches at times.  Neither person knew what the name of the patch is.  Eric [name] told me her birth date and I called [facility] and asked what the medication is. Eric. [name] patch that Eric [name] has been using is Lidoderm 5%.  I instructed Eric [name] that patient need an order from his Dr for the medication. Eric [name] replied "I hope after surgery that I will not need any pain medication.

## 2014-04-19 NOTE — Pre-Procedure Instructions (Addendum)
Eric [name]  04/19/2014   Your procedure is scheduled on:  Wednesday, August 5.  Report to [facility] Admitting at 6:30 AM.  Call this number if you have problems the morning of surgery: [phone]   Remember:   Do not eat food or drink liquids after midnight Tuesday, August 4 .    Take these medicines the morning of surgery with A SIP OF WATER: Aspirin, Nexium.              On July 29th stop taking Vitamins and Herbal Medications (Fish Oil) , Aleve.  Do not take Advil.    Do not wear jewelry, make-up or nail polish.  Do not wear lotions, powders, or perfumes.    Men may shave face and neck.  Do not bring valuables to the hospital.              [facility] is not responsible  for any belongings or valuables.               Contacts, dentures or bridgework may not be worn into surgery.  Leave suitcase in the car. After surgery it may be brought to your room.  For patients admitted to the hospital, discharge time is determined by your treatment team.                 Special Instructions: Review  [facility] - Preparing For Surgery.   Please read over the following fact sheets that you were given: Pain Booklet, Coughing and Deep Breathing, Blood Transfusion Information and Surgical Site Infection Prevention

## 2014-05-01 MED ORDER — CEFUROXIME SODIUM 1.5 G IJ SOLR
1.5000 g | INTRAMUSCULAR | Status: AC
  Administered 2014-05-02: 1.5 g via INTRAVENOUS
  Filled 2014-05-01: qty 1.5

## 2014-05-01 MED ORDER — SODIUM CHLORIDE 0.9 % IV SOLN: INTRAVENOUS | Status: DC

## 2014-05-02 ENCOUNTER — Encounter (HOSPITAL_COMMUNITY): Payer: Medicare HMO | Admitting: Certified Registered Nurse Anesthetist

## 2014-05-02 ENCOUNTER — Inpatient Hospital Stay (HOSPITAL_COMMUNITY): Payer: Medicare HMO | Admitting: Certified Registered Nurse Anesthetist

## 2014-05-02 ENCOUNTER — Encounter (HOSPITAL_COMMUNITY)
Admission: RE | Disposition: A | Discharge status: Home / Self Care | Payer: Self-pay | Source: Ambulatory Visit | Attending: Vascular Surgery

## 2014-05-02 ENCOUNTER — Encounter (HOSPITAL_COMMUNITY): Payer: Self-pay | Admitting: *Deleted

## 2014-05-02 ENCOUNTER — Inpatient Hospital Stay (HOSPITAL_COMMUNITY)
Admission: RE | Admit: 2014-05-02 | Discharge: 2014-05-03 | DRG: 254 | Disposition: A | Discharge status: Home / Self Care | Payer: Medicare HMO | Source: Ambulatory Visit | Attending: Vascular Surgery | Admitting: Vascular Surgery

## 2014-05-02 DIAGNOSIS — Z85038 Personal history of other malignant neoplasm of large intestine: Secondary | ICD-10-CM

## 2014-05-02 DIAGNOSIS — Z7982 Long term (current) use of aspirin: Secondary | ICD-10-CM | POA: Diagnosis not present

## 2014-05-02 DIAGNOSIS — I2510 Atherosclerotic heart disease of native coronary artery without angina pectoris: Secondary | ICD-10-CM | POA: Diagnosis present

## 2014-05-02 DIAGNOSIS — Z79899 Other long term (current) drug therapy: Secondary | ICD-10-CM

## 2014-05-02 DIAGNOSIS — E7800 Pure hypercholesterolemia, unspecified: Secondary | ICD-10-CM | POA: Diagnosis present

## 2014-05-02 DIAGNOSIS — F17200 Nicotine dependence, unspecified, uncomplicated: Secondary | ICD-10-CM | POA: Diagnosis present

## 2014-05-02 DIAGNOSIS — K219 Gastro-esophageal reflux disease without esophagitis: Secondary | ICD-10-CM | POA: Diagnosis present

## 2014-05-02 DIAGNOSIS — I10 Essential (primary) hypertension: Secondary | ICD-10-CM | POA: Diagnosis present

## 2014-05-02 DIAGNOSIS — Z833 Family history of diabetes mellitus: Secondary | ICD-10-CM

## 2014-05-02 DIAGNOSIS — I739 Peripheral vascular disease, unspecified: Secondary | ICD-10-CM

## 2014-05-02 DIAGNOSIS — Z8673 Personal history of transient ischemic attack (TIA), and cerebral infarction without residual deficits: Secondary | ICD-10-CM | POA: Diagnosis not present

## 2014-05-02 DIAGNOSIS — M810 Age-related osteoporosis without current pathological fracture: Secondary | ICD-10-CM | POA: Diagnosis present

## 2014-05-02 DIAGNOSIS — Z8249 Family history of ischemic heart disease and other diseases of the circulatory system: Secondary | ICD-10-CM | POA: Diagnosis not present

## 2014-05-02 DIAGNOSIS — I70219 Atherosclerosis of native arteries of extremities with intermittent claudication, unspecified extremity: Principal | ICD-10-CM | POA: Diagnosis present

## 2014-05-02 HISTORY — PX: ENDARTERECTOMY FEMORAL: SHX5804

## 2014-05-02 SURGERY — ENDARTERECTOMY, FEMORAL
Anesthesia: General | Site: Leg Upper | Laterality: Left

## 2014-05-02 MED ORDER — HYDROMORPHONE HCL PF 1 MG/ML IJ SOLN
0.2500 mg | INTRAMUSCULAR | Status: DC | PRN
  Administered 2014-05-02: 0.25 mg via INTRAVENOUS

## 2014-05-02 MED ORDER — METOPROLOL TARTRATE 1 MG/ML IV SOLN: 2.0000 mg | INTRAVENOUS | Status: DC | PRN

## 2014-05-02 MED ORDER — EPHEDRINE SULFATE 50 MG/ML IJ SOLN
INTRAMUSCULAR | Status: DC | PRN
  Administered 2014-05-02: 5 mg via INTRAVENOUS
  Administered 2014-05-02: 10 mg via INTRAVENOUS
  Administered 2014-05-02 (×2): 5 mg via INTRAVENOUS

## 2014-05-02 MED ORDER — ONDANSETRON HCL 4 MG/2ML IJ SOLN
4.0000 mg | Freq: Four times a day (QID) | INTRAMUSCULAR | Status: DC | PRN
  Filled 2014-05-02: qty 2

## 2014-05-02 MED ORDER — OXYCODONE HCL 5 MG PO TABS
5.0000 mg | ORAL_TABLET | Freq: Once | ORAL | Status: AC | PRN
  Administered 2014-05-02: 5 mg via ORAL

## 2014-05-02 MED ORDER — LIDOCAINE HCL (CARDIAC) 20 MG/ML IV SOLN
INTRAVENOUS | Status: DC | PRN
  Administered 2014-05-02: 40 mg via INTRAVENOUS

## 2014-05-02 MED ORDER — FENTANYL CITRATE 0.05 MG/ML IJ SOLN
INTRAMUSCULAR | Status: AC
  Filled 2014-05-02: qty 5

## 2014-05-02 MED ORDER — ADULT MULTIVITAMIN W/MINERALS CH
1.0000 | ORAL_TABLET | Freq: Every evening | ORAL | Status: DC
  Administered 2014-05-02: 1 via ORAL
  Filled 2014-05-02 (×2): qty 1

## 2014-05-02 MED ORDER — ROCURONIUM BROMIDE 50 MG/5ML IV SOLN
INTRAVENOUS | Status: AC
  Filled 2014-05-02: qty 1

## 2014-05-02 MED ORDER — NEOSTIGMINE METHYLSULFATE 10 MG/10ML IV SOLN
INTRAVENOUS | Status: DC | PRN
  Administered 2014-05-02: 3 mg via INTRAVENOUS
  Administered 2014-05-02: 2 mg via INTRAVENOUS

## 2014-05-02 MED ORDER — FENTANYL CITRATE 0.05 MG/ML IJ SOLN
INTRAMUSCULAR | Status: DC | PRN
  Administered 2014-05-02: 100 ug via INTRAVENOUS
  Administered 2014-05-02: 50 ug via INTRAVENOUS

## 2014-05-02 MED ORDER — LATANOPROST 0.005 % OP SOLN
1.0000 [drp] | Freq: Every day | OPHTHALMIC | Status: DC
  Administered 2014-05-02: 1 [drp] via OPHTHALMIC
  Filled 2014-05-02: qty 2.5

## 2014-05-02 MED ORDER — HYDROMORPHONE HCL PF 1 MG/ML IJ SOLN
INTRAMUSCULAR | Status: AC
  Filled 2014-05-02: qty 1

## 2014-05-02 MED ORDER — ONDANSETRON HCL 4 MG/2ML IJ SOLN: 4.0000 mg | Freq: Once | INTRAMUSCULAR | Status: DC | PRN

## 2014-05-02 MED ORDER — PROTAMINE SULFATE 10 MG/ML IV SOLN
INTRAVENOUS | Status: DC | PRN
  Administered 2014-05-02: 30 mg via INTRAVENOUS
  Administered 2014-05-02: 20 mg via INTRAVENOUS

## 2014-05-02 MED ORDER — PROPOFOL 10 MG/ML IV BOLUS
INTRAVENOUS | Status: DC | PRN
  Administered 2014-05-02: 160 mg via INTRAVENOUS

## 2014-05-02 MED ORDER — ACETAMINOPHEN 650 MG RE SUPP: 325.0000 mg | RECTAL | Status: DC | PRN

## 2014-05-02 MED ORDER — CLONAZEPAM 0.5 MG PO TABS
0.5000 mg | ORAL_TABLET | Freq: Every day | ORAL | Status: DC
  Administered 2014-05-02: 0.5 mg via ORAL
  Filled 2014-05-02: qty 1

## 2014-05-02 MED ORDER — DOPAMINE-DEXTROSE 3.2-5 MG/ML-% IV SOLN: 3.0000 ug/kg/min | INTRAVENOUS | Status: DC

## 2014-05-02 MED ORDER — EPHEDRINE SULFATE 50 MG/ML IJ SOLN
INTRAMUSCULAR | Status: AC
  Filled 2014-05-02: qty 2

## 2014-05-02 MED ORDER — PROTAMINE SULFATE 10 MG/ML IV SOLN
INTRAVENOUS | Status: AC
  Filled 2014-05-02: qty 5

## 2014-05-02 MED ORDER — ASPIRIN EC 81 MG PO TBEC
81.0000 mg | DELAYED_RELEASE_TABLET | Freq: Every day | ORAL | Status: DC
  Filled 2014-05-02: qty 1

## 2014-05-02 MED ORDER — LACTATED RINGERS IV SOLN
INTRAVENOUS | Status: DC | PRN
  Administered 2014-05-02: 08:00:00 via INTRAVENOUS

## 2014-05-02 MED ORDER — ONDANSETRON HCL 4 MG/2ML IJ SOLN
INTRAMUSCULAR | Status: AC
  Filled 2014-05-02: qty 2

## 2014-05-02 MED ORDER — PROPOFOL 10 MG/ML IV BOLUS
INTRAVENOUS | Status: AC
  Filled 2014-05-02: qty 20

## 2014-05-02 MED ORDER — DOCUSATE SODIUM 100 MG PO CAPS: 100.0000 mg | ORAL_CAPSULE | Freq: Every day | ORAL | Status: DC

## 2014-05-02 MED ORDER — ACETAMINOPHEN 325 MG PO TABS: 325.0000 mg | ORAL_TABLET | ORAL | Status: DC | PRN

## 2014-05-02 MED ORDER — DEXTROSE-NACL 5-0.9 % IV SOLN
INTRAVENOUS | Status: DC
  Administered 2014-05-02: 14:00:00 via INTRAVENOUS

## 2014-05-02 MED ORDER — ONDANSETRON HCL 4 MG/2ML IJ SOLN
INTRAMUSCULAR | Status: DC | PRN
  Administered 2014-05-02: 4 mg via INTRAVENOUS

## 2014-05-02 MED ORDER — SIMVASTATIN 20 MG PO TABS
20.0000 mg | ORAL_TABLET | Freq: Every day | ORAL | Status: DC
  Administered 2014-05-02: 20 mg via ORAL
  Filled 2014-05-02 (×2): qty 1

## 2014-05-02 MED ORDER — NEOSTIGMINE METHYLSULFATE 10 MG/10ML IV SOLN
INTRAVENOUS | Status: AC
  Filled 2014-05-02: qty 1

## 2014-05-02 MED ORDER — POTASSIUM CHLORIDE CRYS ER 20 MEQ PO TBCR: 20.0000 meq | EXTENDED_RELEASE_TABLET | Freq: Every day | ORAL | Status: DC | PRN

## 2014-05-02 MED ORDER — ROCURONIUM BROMIDE 100 MG/10ML IV SOLN
INTRAVENOUS | Status: DC | PRN
  Administered 2014-05-02: 50 mg via INTRAVENOUS

## 2014-05-02 MED ORDER — HYDROCHLOROTHIAZIDE 25 MG PO TABS
25.0000 mg | ORAL_TABLET | Freq: Every day | ORAL | Status: DC
  Filled 2014-05-02: qty 1

## 2014-05-02 MED ORDER — LIDOCAINE HCL (CARDIAC) 20 MG/ML IV SOLN
INTRAVENOUS | Status: AC
  Filled 2014-05-02: qty 5

## 2014-05-02 MED ORDER — PANTOPRAZOLE SODIUM 40 MG PO TBEC
40.0000 mg | DELAYED_RELEASE_TABLET | Freq: Every day | ORAL | Status: DC
  Administered 2014-05-02: 40 mg via ORAL
  Filled 2014-05-02: qty 1

## 2014-05-02 MED ORDER — MIDAZOLAM HCL 2 MG/2ML IJ SOLN
INTRAMUSCULAR | Status: AC
  Filled 2014-05-02: qty 2

## 2014-05-02 MED ORDER — OXYCODONE HCL 5 MG PO TABS
5.0000 mg | ORAL_TABLET | ORAL | Status: DC | PRN
  Administered 2014-05-02 – 2014-05-03 (×2): 5 mg via ORAL
  Filled 2014-05-02 (×2): qty 1

## 2014-05-02 MED ORDER — HYDRALAZINE HCL 20 MG/ML IJ SOLN
10.0000 mg | INTRAMUSCULAR | Status: DC | PRN
Start: 2014-05-02 — End: 2014-05-03

## 2014-05-02 MED ORDER — LABETALOL HCL 5 MG/ML IV SOLN: 10.0000 mg | INTRAVENOUS | Status: DC | PRN

## 2014-05-02 MED ORDER — ARTIFICIAL TEARS OP OINT
TOPICAL_OINTMENT | OPHTHALMIC | Status: DC | PRN
  Administered 2014-05-02: 1 via OPHTHALMIC

## 2014-05-02 MED ORDER — ALENDRONATE SODIUM 10 MG PO TABS: 40.0000 mg | ORAL_TABLET | ORAL | Status: DC

## 2014-05-02 MED ORDER — SODIUM CHLORIDE 0.9 % IV SOLN: 500.0000 mL | Freq: Once | INTRAVENOUS | Status: AC | PRN

## 2014-05-02 MED ORDER — SODIUM CHLORIDE 0.9 % IR SOLN
Status: DC | PRN
  Administered 2014-05-02: 09:00:00

## 2014-05-02 MED ORDER — STERILE WATER FOR INJECTION IJ SOLN
INTRAMUSCULAR | Status: AC
  Filled 2014-05-02: qty 20

## 2014-05-02 MED ORDER — OXYCODONE HCL 5 MG PO TABS
ORAL_TABLET | ORAL | Status: AC
  Filled 2014-05-02: qty 1

## 2014-05-02 MED ORDER — ARTIFICIAL TEARS OP OINT
TOPICAL_OINTMENT | OPHTHALMIC | Status: AC
  Filled 2014-05-02: qty 3.5

## 2014-05-02 MED ORDER — ALUM & MAG HYDROXIDE-SIMETH 200-200-20 MG/5ML PO SUSP: 15.0000 mL | ORAL | Status: DC | PRN

## 2014-05-02 MED ORDER — CHLORHEXIDINE GLUCONATE 4 % EX LIQD
60.0000 mL | Freq: Once | CUTANEOUS | Status: DC
  Filled 2014-05-02: qty 60

## 2014-05-02 MED ORDER — OXYCODONE HCL 5 MG/5ML PO SOLN: 5.0000 mg | Freq: Once | ORAL | Status: AC | PRN

## 2014-05-02 MED ORDER — PHENOL 1.4 % MT LIQD: 1.0000 | OROMUCOSAL | Status: DC | PRN

## 2014-05-02 MED ORDER — GUAIFENESIN-DM 100-10 MG/5ML PO SYRP: 15.0000 mL | ORAL_SOLUTION | ORAL | Status: DC | PRN

## 2014-05-02 MED ORDER — GLYCOPYRROLATE 0.2 MG/ML IJ SOLN
INTRAMUSCULAR | Status: DC | PRN
  Administered 2014-05-02: 0.6 mg via INTRAVENOUS
  Administered 2014-05-02: 0.4 mg via INTRAVENOUS

## 2014-05-02 MED ORDER — CEFUROXIME SODIUM 1.5 G IJ SOLR
1.5000 g | Freq: Two times a day (BID) | INTRAMUSCULAR | Status: DC
  Administered 2014-05-02: 1.5 g via INTRAVENOUS
  Filled 2014-05-02 (×2): qty 1.5

## 2014-05-02 MED ORDER — HEPARIN SODIUM (PORCINE) 1000 UNIT/ML IJ SOLN
INTRAMUSCULAR | Status: DC | PRN
  Administered 2014-05-02: 7000 [IU] via INTRAVENOUS

## 2014-05-02 MED ORDER — MIDAZOLAM HCL 5 MG/5ML IJ SOLN
INTRAMUSCULAR | Status: DC | PRN
  Administered 2014-05-02: 1 mg via INTRAVENOUS

## 2014-05-02 MED ORDER — 0.9 % SODIUM CHLORIDE (POUR BTL) OPTIME
TOPICAL | Status: DC | PRN
  Administered 2014-05-02 (×2): 1000 mL

## 2014-05-02 MED ORDER — MORPHINE SULFATE 2 MG/ML IJ SOLN: 2.0000 mg | INTRAMUSCULAR | Status: DC | PRN

## 2014-05-02 MED ORDER — LISINOPRIL 5 MG PO TABS
5.0000 mg | ORAL_TABLET | Freq: Every day | ORAL | Status: DC
  Filled 2014-05-02: qty 1

## 2014-05-02 MED ORDER — GLYCOPYRROLATE 0.2 MG/ML IJ SOLN
INTRAMUSCULAR | Status: AC
  Filled 2014-05-02: qty 5

## 2014-05-02 MED ORDER — ENOXAPARIN SODIUM 30 MG/0.3ML ~~LOC~~ SOLN
30.0000 mg | SUBCUTANEOUS | Status: DC
  Filled 2014-05-02: qty 0.3

## 2014-05-02 SURGICAL SUPPLY — 57 items
BANDAGE ESMARK 6X9 LF (GAUZE/BANDAGES/DRESSINGS) IMPLANT
BENZOIN TINCTURE PRP APPL 2/3 (GAUZE/BANDAGES/DRESSINGS) ×3 IMPLANT
BLADE 10 SAFETY STRL DISP (BLADE) IMPLANT
BNDG ESMARK 6X9 LF (GAUZE/BANDAGES/DRESSINGS)
CANISTER SUCTION 2500CC (MISCELLANEOUS) ×3 IMPLANT
CLIP LIGATING EXTRA MED SLVR (CLIP) ×3 IMPLANT
CLIP LIGATING EXTRA SM BLUE (MISCELLANEOUS) ×3 IMPLANT
CLOSURE WOUND 1/2 X4 (GAUZE/BANDAGES/DRESSINGS) ×1
COVER SURGICAL LIGHT HANDLE (MISCELLANEOUS) ×3 IMPLANT
CUFF TOURNIQUET SINGLE 18IN (TOURNIQUET CUFF) IMPLANT
CUFF TOURNIQUET SINGLE 24IN (TOURNIQUET CUFF) IMPLANT
CUFF TOURNIQUET SINGLE 34IN LL (TOURNIQUET CUFF) IMPLANT
CUFF TOURNIQUET SINGLE 44IN (TOURNIQUET CUFF) IMPLANT
DRAIN SNY 10X20 3/4 PERF (WOUND CARE) IMPLANT
DRAPE WARM FLUID 44X44 (DRAPE) ×3 IMPLANT
DRAPE X-RAY CASS 24X20 (DRAPES) IMPLANT
DRSG COVADERM 4X8 (GAUZE/BANDAGES/DRESSINGS) ×3 IMPLANT
ELECT REM PT RETURN 9FT ADLT (ELECTROSURGICAL) ×3
ELECTRODE REM PT RTRN 9FT ADLT (ELECTROSURGICAL) ×1 IMPLANT
EVACUATOR SILICONE 100CC (DRAIN) IMPLANT
GLOVE BIO SURGEON STRL SZ 6.5 (GLOVE) ×4 IMPLANT
GLOVE BIO SURGEONS STRL SZ 6.5 (GLOVE) ×2
GLOVE BIOGEL PI IND STRL 6.5 (GLOVE) ×1 IMPLANT
GLOVE BIOGEL PI IND STRL 7.0 (GLOVE) ×2 IMPLANT
GLOVE BIOGEL PI IND STRL 7.5 (GLOVE) ×1 IMPLANT
GLOVE BIOGEL PI INDICATOR 6.5 (GLOVE) ×2
GLOVE BIOGEL PI INDICATOR 7.0 (GLOVE) ×4
GLOVE BIOGEL PI INDICATOR 7.5 (GLOVE) ×2
GLOVE SS BIOGEL STRL SZ 7.5 (GLOVE) ×1 IMPLANT
GLOVE SUPERSENSE BIOGEL SZ 7.5 (GLOVE) ×2
GLOVE SURG SS PI 7.5 STRL IVOR (GLOVE) ×3 IMPLANT
GOWN L4 XLG 20 PK N/S (GOWN DISPOSABLE) ×6 IMPLANT
GOWN STRL REUS W/ TWL LRG LVL3 (GOWN DISPOSABLE) ×2 IMPLANT
GOWN STRL REUS W/TWL LRG LVL3 (GOWN DISPOSABLE) ×4
KIT BASIN OR (CUSTOM PROCEDURE TRAY) ×3 IMPLANT
KIT ROOM TURNOVER OR (KITS) ×3 IMPLANT
NS IRRIG 1000ML POUR BTL (IV SOLUTION) ×6 IMPLANT
PACK PERIPHERAL VASCULAR (CUSTOM PROCEDURE TRAY) ×3 IMPLANT
PAD ARMBOARD 7.5X6 YLW CONV (MISCELLANEOUS) ×6 IMPLANT
PADDING CAST COTTON 6X4 STRL (CAST SUPPLIES) IMPLANT
PATCH HEMASHIELD 8X75 (Vascular Products) ×3 IMPLANT
SET COLLECT BLD 21X3/4 12 (NEEDLE) IMPLANT
STAPLER VISISTAT 35W (STAPLE) IMPLANT
STOPCOCK 4 WAY LG BORE MALE ST (IV SETS) IMPLANT
STRIP CLOSURE SKIN 1/2X4 (GAUZE/BANDAGES/DRESSINGS) ×2 IMPLANT
SUT ETHILON 3 0 PS 1 (SUTURE) IMPLANT
SUT PROLENE 5 0 C 1 24 (SUTURE) ×3 IMPLANT
SUT PROLENE 6 0 CC (SUTURE) ×12 IMPLANT
SUT VIC AB 2-0 CTX 36 (SUTURE) ×3 IMPLANT
SUT VIC AB 3-0 SH 27 (SUTURE) ×4
SUT VIC AB 3-0 SH 27X BRD (SUTURE) ×2 IMPLANT
TOWEL OR 17X24 6PK STRL BLUE (TOWEL DISPOSABLE) ×6 IMPLANT
TOWEL OR 17X26 10 PK STRL BLUE (TOWEL DISPOSABLE) ×6 IMPLANT
TRAY FOLEY CATH 16FRSI W/METER (SET/KITS/TRAYS/PACK) ×3 IMPLANT
TUBING EXTENTION W/L.L. (IV SETS) IMPLANT
UNDERPAD 30X30 INCONTINENT (UNDERPADS AND DIAPERS) IMPLANT
WATER STERILE IRR 1000ML POUR (IV SOLUTION) ×3 IMPLANT

## 2014-05-02 NOTE — Transfer of Care (Signed)
Immediate Anesthesia Transfer of Care Note  Patient: Eric [name]  Procedure(s) Performed: Procedure(s): LEFT FEMORAL ENDARTERECTOMY  (Left)  Patient Location: PACU  Anesthesia Type:General  Level of Consciousness: awake, alert  and oriented  Airway & Oxygen Therapy: Patient Spontanous Breathing and Patient connected to nasal cannula oxygen  Post-op Assessment: Report given to PACU RN, Post -op Vital signs reviewed and stable and Patient moving all extremities X 4  Post vital signs: Reviewed and stable  Complications: No apparent anesthesia complications

## 2014-05-02 NOTE — Anesthesia Procedure Notes (Signed)
Procedure Name: Intubation Date/Time: 05/02/2014 8:42 AM Performed by: Ollen Bowl Pre-anesthesia Checklist: Patient identified, Emergency Drugs available, Suction available, Patient being monitored and Timeout performed Patient Re-evaluated:Patient Re-evaluated prior to inductionOxygen Delivery Method: Circle system utilized and Simple face mask Preoxygenation: Pre-oxygenation with 100% oxygen Intubation Type: IV induction Ventilation: Mask ventilation without difficulty Laryngoscope Size: Miller and 3 Grade View: Grade II Tube type: Oral Tube size: 7.5 mm Number of attempts: 1 Airway Equipment and Method: Patient positioned with wedge pillow and Stylet Placement Confirmation: ETT inserted through vocal cords under direct vision,  positive ETCO2 and breath sounds checked- equal and bilateral Secured at: 21 cm Tube secured with: Tape Dental Injury: Teeth and Oropharynx as per pre-operative assessment

## 2014-05-02 NOTE — Progress Notes (Signed)
     Subjective  - Doing well he is hungry.   Left groin clean and dry.  Left DP/PTDoppler signals. S/P left femoral endarterectomy.  Stable  Sravya Grissom MAUREEN PA-C

## 2014-05-02 NOTE — Op Note (Signed)
    OPERATIVE REPORT  DATE OF SURGERY: 05/02/2014  PATIENT: Eric [name], 80 y.o. male MRN: [id number]  DOB: November 13, 1929  PRE-OPERATIVE DIAGNOSIS: Limiting claudication left leg  POST-OPERATIVE DIAGNOSIS:  Same  PROCEDURE: Left external iliac common femoral endarterectomy and Dacron patch angioplasty  SURGEON:  Curt Jews, M.D.   ASSISTANT: Dr. Trula Slade  ANESTHESIA:  Gen.  EBL: Less than 100 ml  Total I/O In: 800 [I.V.:800] Out: 290 [Urine:240; Blood:50]  BLOOD ADMINISTERED: None  DRAINS: None  SPECIMEN: None  COUNTS CORRECT:  YES  PLAN OF CARE: PACU stable   PATIENT DISPOSITION:  PACU - hemodynamically stable  PROCEDURE DETAILS: The patient was taken to the operating placed in the area of the left groin was prepped and draped in the usual sterile fashion. Was made over the femoral pulse at the inguinal ligament. This was carried down through the subcutaneous tissue electrocautery. The dissection was tented up under the inguinal ligament. The patient had extensive plaque in the common femoral artery. The external iliac artery was less calcified and did have some posterior plaque. Marked tributary branches were controlled with 2-0 silk ties at the inguinal ligament and the external iliac artery was encircled with vessel loop both above the inguinal ligament. Dissection skin down to the bifurcation. The superficial femoral artery was controlled with a blue vessel loop Potts tie. The profundus femoris artery was dissected down to its main branches and these were controlled with vessel loops as well. The patient was given 7000 units heparin circulation time the external iliac artery was occluded above the inguinal ligament and the superficial femoral and profundus femoris branches were controlled with vessel loops. The artery was opened on the common femoral artery with an 11 blade is enlarged with Potts scissors. This was continued up to the external iliac artery under the inguinal  ligament and down onto the artery. Endarterectomy was undertaken and the plaque was divided and the superficial artery with Potts scissors. There was a good feathering in point in the profundus femoris artery at its main branches. The endarterectomy was extended and the plaque was removed up under the inguinal ligament area where the external iliac artery was widely patent. Remaining atheromatous debris was removed from the endarterectomy plane. The distal plaque was tacked with interrupted 6-0 Prolene sutures on the superficial femoral artery. A Finesse Hemashield Dacron patch was brought onto the field and was sent as a patch angioplasty again on the external iliac artery and extending down onto the superficial artery. Prior to completion of closure the usual flushing maneuvers were undertaken. As was completed and flow is restored to the lower extremity by removing the clamps. Patient had excellent Doppler flow through the superficial femoral and deep femoral arteries. The patient was given 50 mg of protamine to reverse the heparin. Wound irrigated with saline. Hemostasis daily cautery. Wounds were closed with 2-0 Vicryl in several layers in the subcutaneous tissue. Skin was closed with 3-0 subcuticular Vicryl stitch. Sterile dressing was applied and the patient was taken to the recovery in stable condition   Curt Jews, M.D. 05/02/2014 10:56 AM

## 2014-05-02 NOTE — H&P (Signed)
Kharter Brew  03/13/2014 11:00 AM   Office Visit  MRN:  [id number]   Description: 80 year old male  Provider: Rosetta Posner, MD  Department: [facility]         Diagnoses      Atherosclerosis of native arteries of the extremities with intermittent claudication    -  Primary      440.21             Reason for Visit      New Evaluation      L leg worsening claudication                Current Vitals Most recent update: 03/13/2014 11:14 AM by Bard Herbert Maness-Harrison, CMA      BP Pulse Ht Wt BMI SpO2      145/58 55 5\' 6"  (1.676 m) 140 lb 14.4 oz (63.912 kg) 22.75 kg/m2 100%        Vitals History Recorded                Progress Notes      Rosetta Posner, MD at 03/13/2014  2:06 PM      Status: Signed                   Patient name: Eric [name]       MRN: [id number]        DOB: 01-29-1930            Sex: male     Referred by: Felipa Eth   Reason for referral:   Chief Complaint   Patient presents with   .  New Evaluation       L leg worsening claudication         HISTORY OF PRESENT ILLNESS: Patient has today for evaluation of left leg claudication this is mild in the right as well. He is here today with his wife. He remains in very good health and is quite active. He denies any rest pain and denies any tissue loss. He reports that he remains active in bowling and and this yard and this is quite limiting to him. He does have a remote history of left leg stenting in 1999. I'm trying to obtain past records to see exactly what was treated. He had very good long-term result reports this is now become intolerable claudication.    Past Medical History   Diagnosis  Date   .  Peripheral vascular disease     .  Transient global amnesia     .  Hypercholesterolemia     .  Colon cancer     .  Mesenteric thrombosis         post colon resection   .  Hypertension     .  Macrocytic anemia     .  Glaucoma     .  Osteoporosis     .  GERD (gastroesophageal reflux disease)      .  Cholelithiasis     .  Shingles  2009   .  Pneumonia  02-2011       left lower lobe   .  Stroke     .  CAD (coronary artery disease)           Past Surgical History   Procedure  Laterality  Date   .  Laminectomies           multiple   .  Exploratory laparotomy  treat twisted intestines   .  Colectomy           to remove adenoma   .  Femoral artery stenting             History       Social History   .  Marital Status:  Single       Spouse Name:  N/A       Number of Children:  N/A   .  Years of Education:  N/A       Occupational History   .  Not on file.       Social History Main Topics   .  Smoking status:  Former Smoker       Types:  Cigarettes       Quit date:  09/28/1988   .  Smokeless tobacco:  Current User       Types:  Chew   .  Alcohol Use:  Yes         Comment: rare   .  Drug Use:  No   .  Sexual Activity:  Not on file       Other Topics  Concern   .  Not on file       Social History Narrative   .  No narrative on file         Family History   Problem  Relation  Age of Onset   .  Diabetes  Mother     .  Heart disease  Mother     .  Hypertension  Mother     .  Heart disease  Father     .  Hypertension  Father     .  Diabetes  Father     .  Hyperlipidemia  Father     .  Peripheral vascular disease  Father     .  Cancer  Brother     .  Diabetes  Brother     .  Hypertension  Brother     .  Heart disease  Brother         before age 70   .  Cancer  Sister           Allergies as of 03/13/2014   .  (No Known Allergies)         Current Outpatient Prescriptions on File Prior to Visit   Medication  Sig  Dispense  Refill   .  aspirin 81 MG tablet  Take 81 mg by mouth daily.         .  calcium citrate-vitamin D (CITRACAL+D) 315-200 MG-UNIT per tablet  Take 1 tablet by mouth 2 (two) times daily.         .  clonazePAM (KLONOPIN) 0.5 MG tablet  Take 0.5 mg by mouth daily.         Marland Kitchen  esomeprazole (NEXIUM) 40 MG capsule  Take  40 mg by mouth daily at 12 noon.         .  finasteride (PROSCAR) 5 MG tablet  Take 5 mg by mouth daily.         .  Flaxseed, Linseed, (FLAX SEED OIL PO)  Take 1,000 mg by mouth daily.         .  hydrochlorothiazide (HYDRODIURIL) 25 MG tablet  Take 25 mg by mouth daily.         Marland Kitchen  lisinopril (PRINIVIL,ZESTRIL) 5 MG tablet  Take 5 mg by  mouth daily.         .  Multiple Vitamins-Minerals (MULTIVITAMIN PO)  Take by mouth daily.         .  Omega-3 Fatty Acids (FISH OIL) 1000 MG CAPS  Take 1,000 mg by mouth daily.         .  simvastatin (ZOCOR) 20 MG tablet  Take 20 mg by mouth daily.         .  tamsulosin (FLOMAX) 0.4 MG CAPS capsule  Take 0.4 mg by mouth daily.             No current facility-administered medications on file prior to visit.          REVIEW OF SYSTEMS:   Positives indicated with an "X"   CARDIOVASCULAR:  [ ]  chest pain   [ ]  chest pressure   [ ]  palpitations   [ ]  orthopnea               [ x] dyspnea on exertion   [x ] claudication   [ ]  rest pain   [ ]  DVT   [ ]  phlebitis PULMONARY:   [ ]  productive cough   [ ]  asthma   [ ]  wheezing NEUROLOGIC:   [x ] weakness  [x ] paresthesias  [ ]  aphasia  [ ]  amaurosis  [ ]  dizziness HEMATOLOGIC:   [ ]  bleeding problems   [ ]  clotting disorders MUSCULOSKELETAL:  [ ]  joint pain   [ ]  joint swelling GASTROINTESTINAL: [ ]   blood in stool  [ ]   hematemesis GENITOURINARY:  [ ]   dysuria  [ ]   hematuria PSYCHIATRIC:  [ ]  history of major depression INTEGUMENTARY:  [ ]  rashes  [ ]  ulcers CONSTITUTIONAL:  [ ]  fever   [ ]  chills   PHYSICAL EXAMINATION:   General: The patient is a well-nourished male, in no acute distress. Vital signs are BP 145/58  Pulse 55  Ht 5\' 6"  (1.676 m)  Wt 140 lb 14.4 oz (63.912 kg)  BMI 22.75 kg/m2  SpO2 100% Pulmonary: There is a good air exchange bilaterally without wheezing or rales. Abdomen: Soft and non-tender with normal pitch bowel sounds. Musculoskeletal: There are no major deformities.   There is no significant extremity pain. Neurologic: No focal weakness or paresthesias are detected, Skin: There are no ulcer or rashes noted. Psychiatric: The patient has normal affect. Cardiovascular: There is a regular rate and rhythm without significant murmur appreciated. Carotid arteries without bruits bilaterally He does have 2+ radial pulses and palpable femoral pulses bilaterally. He does have a palpable popliteal pulses which are 1+. I do not palpate pedal pulses     [facility] Lab Studies:   Ordered and Independently Reviewed these reveal normal ankle arm indices bilaterally with triphasic right dorsalis pedis and triphasic left posterior tibial signals. Duplex of his lower extremities reveal some elevated velocities in his right common femoral artery and the superficial femoral arteries bilaterally. Flow in the left common femoral artery is turbulent with biphasic flow   Impression and Plan:   Had a long discussion with the patient and his wife present. Extremities have any evidence of limb threatening ischemia. His symptoms are quite consistent with arterial claudication symptoms. I have recommended arteriography to further evaluation. He may be a candidate for H. plasty and stenting for improvement of his walking ability. He reports that he is unable to tolerate this level of claudication and wished to proceed as soon as possible. We  have scheduled this for July 1 as an outpatient at the hospital. The patient understands that if we can intervene we will at this same setting       Jb Dulworth [facility] Office: [phone]     Addendum:  The patient has been re-examined and re-evaluated.  The patient's history and physical has been reviewed and is unchanged.    Eric [name] is a 80 y.o. male is being admitted with Peripheral Vascular Disease. All the risks, benefits and other treatment options have been discussed with the patient.  The patient has consented to proceed with Procedure(s): ENDARTERECTOMY FEMORAL as a surgical intervention.  Eric [name] 05/02/2014 8:01 AM [facility]

## 2014-05-02 NOTE — Progress Notes (Signed)
Utilization review completed.  

## 2014-05-02 NOTE — Anesthesia Preprocedure Evaluation (Addendum)
Anesthesia Evaluation  Patient identified by MRN, date of birth, ID band Patient awake    Reviewed: Allergy & Precautions, H&P , NPO status , Patient's Chart, lab work & pertinent test results  Airway Mallampati: II TM Distance: >3 FB Neck ROM: Full    Dental  (+) Teeth Intact, Dental Advisory Given   Pulmonary former smoker,  breath sounds clear to auscultation        Cardiovascular hypertension, Rhythm:Regular Rate:Normal     Neuro/Psych    GI/Hepatic   Endo/Other    Renal/GU      Musculoskeletal   Abdominal   Peds  Hematology   Anesthesia Other Findings   Reproductive/Obstetrics                          Anesthesia Physical Anesthesia Plan  ASA: III  Anesthesia Plan: General   Post-op Pain Management:    Induction: Intravenous  Airway Management Planned: Oral ETT  Additional Equipment:   Intra-op Plan:   Post-operative Plan:   Informed Consent: I have reviewed the patients History and Physical, chart, labs and discussed the procedure including the risks, benefits and alternatives for the proposed anesthesia with the patient or authorized representative who has indicated his/her understanding and acceptance.   Dental advisory given  Plan Discussed with: CRNA and Anesthesiologist  Anesthesia Plan Comments: (PVD S/P bilateral iliac stents with LLE claudication HTn Multiple myeloma, asymptomatic untreated at present)        Anesthesia Quick Evaluation

## 2014-05-02 NOTE — Anesthesia Postprocedure Evaluation (Signed)
  Anesthesia Post-op Note  Patient: Eric [name]  Procedure(s) Performed: Procedure(s): LEFT FEMORAL ENDARTERECTOMY  (Left)  Patient Location: PACU  Anesthesia Type:General  Level of Consciousness: awake, alert  and oriented  Airway and Oxygen Therapy: Patient Spontanous Breathing and Patient connected to nasal cannula oxygen  Post-op Pain: mild  Post-op Assessment: Post-op Vital signs reviewed, Patient's Cardiovascular Status Stable, Respiratory Function Stable, Patent Airway, No signs of Nausea or vomiting and Pain level controlled  Post-op Vital Signs: stable  Last Vitals:  Filed Vitals:   05/02/14 1524  BP:   Pulse:   Temp: 36.3 C  Resp:     Complications: No apparent anesthesia complications

## 2014-05-03 ENCOUNTER — Encounter (HOSPITAL_COMMUNITY): Payer: Self-pay | Admitting: Vascular Surgery

## 2014-05-03 LAB — CBC
HCT: 28 % — ABNORMAL LOW (ref 39.0–52.0)
Hemoglobin: 9.5 g/dL — ABNORMAL LOW (ref 13.0–17.0)
MCH: 36.1 pg — ABNORMAL HIGH (ref 26.0–34.0)
MCHC: 33.9 g/dL (ref 30.0–36.0)
MCV: 106.5 fL — ABNORMAL HIGH (ref 78.0–100.0)
PLATELETS: 190 10*3/uL (ref 150–400)
RBC: 2.63 MIL/uL — ABNORMAL LOW (ref 4.22–5.81)
RDW: 15.5 % (ref 11.5–15.5)
WBC: 5.9 10*3/uL (ref 4.0–10.5)

## 2014-05-03 LAB — BASIC METABOLIC PANEL
ANION GAP: 9 (ref 5–15)
BUN: 13 mg/dL (ref 6–23)
CHLORIDE: 105 meq/L (ref 96–112)
CO2: 29 mEq/L (ref 19–32)
Calcium: 8.8 mg/dL (ref 8.4–10.5)
Creatinine, Ser: 0.89 mg/dL (ref 0.50–1.35)
GFR calc non Af Amer: 77 mL/min — ABNORMAL LOW (ref >90)
GFR, EST AFRICAN AMERICAN: 90 mL/min — AB (ref >90)
Glucose, Bld: 109 mg/dL — ABNORMAL HIGH (ref 70–99)
POTASSIUM: 4.2 meq/L (ref 3.7–5.3)
Sodium: 143 mEq/L (ref 137–147)

## 2014-05-03 MED ORDER — TRAMADOL HCL 50 MG PO TABS
50.0000 mg | ORAL_TABLET | Freq: Four times a day (QID) | ORAL | Status: AC | PRN
Start: 2014-05-03 — End: ?

## 2014-05-03 NOTE — Progress Notes (Signed)
Discussed discharge instructions and medications with patient and family. Both verbalized understanding with all questions answered. VSS. Pulses dopplerable and PIV dc'ed. Pt discharged home with family.  [facility]

## 2014-05-03 NOTE — Progress Notes (Addendum)
  [facility] Progress Note  05/03/2014 7:24 AM 1 Day Post-Op  Subjective:  No complaints and ready to go home  Afebrile HR 70's-80's regular 31'S-970'Y systolic 63% RA  Filed Vitals:   05/03/14 0405  BP: 101/37  Pulse: 78  Temp: 98.3 F (36.8 C)  Resp: 16    Physical Exam: Incisions:  Left groin is c/d/i with steri strips in tact. Extremities:  Left foot is warm; + PT doppler signal  CBC    Component Value Date/Time   WBC 5.9 05/03/2014 0351   WBC 3.7* 03/14/2014 0948   RBC 2.63* 05/03/2014 0351   RBC 3.01* 03/14/2014 0948   HGB 9.5* 05/03/2014 0351   HGB 10.9* 03/14/2014 0948   HCT 28.0* 05/03/2014 0351   HCT 32.6* 03/14/2014 0948   PLT 190 05/03/2014 0351   PLT 223 03/14/2014 0948   MCV 106.5* 05/03/2014 0351   MCV 108.2* 03/14/2014 0948   MCH 36.1* 05/03/2014 0351   MCH 36.2* 03/14/2014 0948   MCHC 33.9 05/03/2014 0351   MCHC 33.5 03/14/2014 0948   RDW 15.5 05/03/2014 0351   RDW 15.1* 03/14/2014 0948   LYMPHSABS 1.5 03/14/2014 0948   LYMPHSABS 0.8 02/17/2009 1520   MONOABS 0.2 03/14/2014 0948   MONOABS 0.1 02/17/2009 1520   EOSABS 0.1 03/14/2014 0948   EOSABS 0.1 02/17/2009 1520   BASOSABS 0.0 03/14/2014 0948   BASOSABS 0.0 02/17/2009 1520    BMET    Component Value Date/Time   NA 143 05/03/2014 0351   NA 142 03/14/2014 0949   K 4.2 05/03/2014 0351   K 4.3 03/14/2014 0949   CL 105 05/03/2014 0351   CO2 29 05/03/2014 0351   CO2 29 03/14/2014 0949   GLUCOSE 109* 05/03/2014 0351   GLUCOSE 102 03/14/2014 0949   BUN 13 05/03/2014 0351   BUN 17.2 03/14/2014 0949   CREATININE 0.89 05/03/2014 0351   CREATININE 1.0 03/14/2014 0949   CALCIUM 8.8 05/03/2014 0351   CALCIUM 9.8 03/14/2014 0949   GFRNONAA 77* 05/03/2014 0351   GFRAA 90* 05/03/2014 0351    INR    Component Value Date/Time   INR 0.97 04/19/2014 0947     Intake/Output Summary (Last 24 hours) at 05/03/14 0724 Last data filed at 05/03/14 0600  Gross per 24 hour  Intake   2305 ml  Output   2290 ml  Net     15 ml      Assessment:  80 y.o. male is s/p:  Left external iliac common femoral endarterectomy and Dacron patch angioplasty  1 Day Post-Op  Plan: -pt doing well this am with + left PT doppler signal -pt has ambulated in the halls and voided  -DVT prophylaxis:  Lovenox to start later today -discharge home this am -f/u with Dr. Donnetta Hutching in 2 weeks. -discussed with pt no heavy lifting for a couple a weeks.  -pt is on statin, asa, and ACEI  Leontine Locket, PA-C [facility] [phone] 05/03/2014 7:24 AM    I have examined the patient, reviewed and agree with above.  Ryzen Deady, MD 05/03/2014 8:23 AM

## 2014-05-03 NOTE — Care Management Note (Signed)
    Page 1 of 1   05/03/2014     11:45:39 AM CARE MANAGEMENT NOTE 05/03/2014  Patient:  Eric [name], Eric [name]   Account Number:  [id number]  Date Initiated:  05/03/2014  Documentation initiated by:  Marvetta Gibbons  Subjective/Objective Assessment:   pt admitted s/p Left external iliac common femoral endarterectomy     Action/Plan:   PTA pt lived at home   Anticipated DC Date:  05/03/2014   Anticipated DC Plan:  HOME/SELF CARE         Choice offered to / List presented to:             Status of service:  Completed, signed off Medicare Important Message given?  NA - LOS <3 / Initial given by admissions (If response is "NO", the following Medicare IM given date fields will be blank) Date Medicare IM given:   Medicare IM given by:   Date Additional Medicare IM given:   Additional Medicare IM given by:    Discharge Disposition:  HOME/SELF CARE  Per UR Regulation:  Reviewed for med. necessity/level of care/duration of stay  If discussed at [place] of Stay Meetings, dates discussed:    Comments:

## 2014-05-03 NOTE — Discharge Summary (Signed)
[facility] Discharge Summary  Eric [name] 1930/05/31 80 y.o. male  [id number]  Admission Date: 05/02/2014  Discharge Date: 05/03/14  Physician: Rosetta Posner, MD  Admission Diagnosis: Peripheral Vascular Disease   HPI:   This is a 80 y.o. male who presents today for evaluation of left leg claudication this is mild in the right as well. He is here today with his wife. He remains in very good health and is quite active. He denies any rest pain and denies any tissue loss. He reports that he remains active in bowling and and this yard and this is quite limiting to him. He does have a remote history of left leg stenting in 1999. I'm trying to obtain past records to see exactly what was treated. He had very good long-term result reports this is now become intolerable claudication.  Hospital Course:  The patient was admitted to the hospital and taken to the operating room on 05/02/2014 and underwent: Left external iliac common femoral endarterectomy and Dacron patch angioplasty    The pt tolerated the procedure well and was transported to the PACU in good condition. By POD 1, he is doing well with + doppler signal left PT.    The remainder of the hospital course consisted of increasing mobilization and increasing intake of solids without difficulty.  CBC    Component Value Date/Time   WBC 5.9 05/03/2014 0351   WBC 3.7* 03/14/2014 0948   RBC 2.63* 05/03/2014 0351   RBC 3.01* 03/14/2014 0948   HGB 9.5* 05/03/2014 0351   HGB 10.9* 03/14/2014 0948   HCT 28.0* 05/03/2014 0351   HCT 32.6* 03/14/2014 0948   PLT 190 05/03/2014 0351   PLT 223 03/14/2014 0948   MCV 106.5* 05/03/2014 0351   MCV 108.2* 03/14/2014 0948   MCH 36.1* 05/03/2014 0351   MCH 36.2* 03/14/2014 0948   MCHC 33.9 05/03/2014 0351   MCHC 33.5 03/14/2014 0948   RDW 15.5 05/03/2014 0351   RDW 15.1* 03/14/2014 0948   LYMPHSABS 1.5 03/14/2014 0948   LYMPHSABS 0.8 02/17/2009 1520   MONOABS 0.2 03/14/2014 0948   MONOABS 0.1 02/17/2009  1520   EOSABS 0.1 03/14/2014 0948   EOSABS 0.1 02/17/2009 1520   BASOSABS 0.0 03/14/2014 0948   BASOSABS 0.0 02/17/2009 1520    BMET    Component Value Date/Time   NA 143 05/03/2014 0351   NA 142 03/14/2014 0949   K 4.2 05/03/2014 0351   K 4.3 03/14/2014 0949   CL 105 05/03/2014 0351   CO2 29 05/03/2014 0351   CO2 29 03/14/2014 0949   GLUCOSE 109* 05/03/2014 0351   GLUCOSE 102 03/14/2014 0949   BUN 13 05/03/2014 0351   BUN 17.2 03/14/2014 0949   CREATININE 0.89 05/03/2014 0351   CREATININE 1.0 03/14/2014 0949   CALCIUM 8.8 05/03/2014 0351   CALCIUM 9.8 03/14/2014 0949   GFRNONAA 77* 05/03/2014 0351   GFRAA 90* 05/03/2014 0351     Discharge Instructions:   The patient is discharged with extensive instructions on wound care and progressive ambulation.  They are instructed not to drive or perform any heavy lifting until returning to see the physician in his office.  Discharge Instructions   Call MD for:  redness, tenderness, or signs of infection (pain, swelling, bleeding, redness, odor or green/yellow discharge around incision site)    Complete by:  As directed      Call MD for:  severe or increased pain, loss or decreased feeling  in  affected limb(s)    Complete by:  As directed      Call MD for:  temperature >100.5    Complete by:  As directed      Driving Restrictions    Complete by:  As directed   No driving for 2 weeks     Lifting restrictions    Complete by:  As directed   No heavy lifting or pushing for 2 weeks     Resume previous diet    Complete by:  As directed            Discharge Diagnosis:  Peripheral Vascular Disease  Secondary Diagnosis: Patient Active Problem List   Diagnosis Date Noted  . PAD (peripheral artery disease) 05/02/2014  . Multiple myeloma, without mention of having achieved remission 04/04/2014  . MGUS (monoclonal gammopathy of unknown significance) 03/14/2014  . Atherosclerosis of native arteries of the extremities with intermittent claudication 03/13/2014    Past Medical History  Diagnosis Date  . Peripheral vascular disease   . Transient global amnesia   . Hypercholesterolemia   . Mesenteric thrombosis     post colon resection  . Hypertension   . Macrocytic anemia   . Glaucoma   . Osteoporosis   . GERD (gastroesophageal reflux disease)   . Cholelithiasis   . Shingles 2009  . Pneumonia 02-2011    left lower lobe  . CAD (coronary artery disease)   . Colon cancer     Multiple Mylemona  . Stroke     'Mini Stroke"  . Dementia   . Skin cancer     ears       Medication List         alendronate 40 MG tablet  Commonly known as:  FOSAMAX  - Take 40 mg by mouth every 7 (seven) days. Take with a full glass of water on an empty stomach.  - On Sunday     aspirin EC 81 MG tablet  Take 81 mg by mouth daily.     CITRACAL PO  Take 1 tablet by mouth every evening.     clonazePAM 0.5 MG tablet  Commonly known as:  KLONOPIN  Take 0.5 mg by mouth at bedtime.     esomeprazole 40 MG capsule  Commonly known as:  NEXIUM  Take 20 mg by mouth 2 (two) times daily.     Fish Oil 1000 MG Caps  Take 1,000 mg by mouth 2 (two) times daily.     hydrochlorothiazide 25 MG tablet  Commonly known as:  HYDRODIURIL  Take 25 mg by mouth daily.     latanoprost 0.005 % ophthalmic solution  Commonly known as:  XALATAN  Place 1 drop into both eyes at bedtime.     lisinopril 5 MG tablet  Commonly known as:  PRINIVIL,ZESTRIL  Take 5 mg by mouth daily.     multivitamin with minerals Tabs tablet  Take 1 tablet by mouth every evening.     naproxen sodium 220 MG tablet  Commonly known as:  ANAPROX  Take 440 mg by mouth 2 (two) times daily with a meal.     simvastatin 20 MG tablet  Commonly known as:  ZOCOR  Take 20 mg by mouth at bedtime.     traMADol 50 MG tablet  Commonly known as:  ULTRAM  Take 1 tablet (50 mg total) by mouth every 6 (six) hours as needed.        Ultram #30 No Refill  Disposition: home  Patient's condition: is  Good  Follow up: 1. Dr. Donnetta Hutching in 2 weeks   Leontine Locket, PA-C [facility] [phone] 05/03/2014  7:57 AM  - For VQI Registry use --- Instructions: Press F2 to tab through selections.  Delete question if not applicable.   Post-op:  Wound infection: No  Graft infection: No  Transfusion: No  If yes, n/a units given New Arrhythmia: No Ipsilateral amputation: No, _0  Minor, _1  BKA, _2  AKA Discharge patency: [x ] Primary, _3  Primary assisted, _4  Secondary, _5  Occluded Patency judged by: _6  Dopper only, _7  Palpable graft pulse, _8  Palpable distal pulse, _9  ABI inc. > 0.15, _10  Duplex Discharge ABI: R , L  Discharge TBI: R , L  D/C Ambulatory Status: Ambulatory  Complications: MI: No, <[id number]>_2<\/[id number]>_84  Troponin only, _12  EKG or Clinical CHF: No Resp failure:No, _13  Pneumonia, _14  Ventilator Chg in renal function: No, _15  Inc. Cr > 0.5, _16  Temp. Dialysis, _17  Permanent dialysis Stroke: No, _18  Minor, _19  Major Return to OR: No  Reason for return to OR: _20  Bleeding, _21  Infection, _22  Thrombosis, _23  Revision  Discharge medications: Statin use:  yes ASA use:  yes Plavix use:  no Beta blocker use: no Coumadin use: no

## 2014-05-21 ENCOUNTER — Encounter: Payer: Self-pay | Admitting: Vascular Surgery

## 2014-05-22 ENCOUNTER — Encounter: Payer: Self-pay | Admitting: Vascular Surgery

## 2014-05-22 ENCOUNTER — Ambulatory Visit (INDEPENDENT_AMBULATORY_CARE_PROVIDER_SITE_OTHER): Payer: Commercial Managed Care - HMO | Admitting: Vascular Surgery

## 2014-05-22 VITALS — BP 133/58 | HR 64 | Ht 65.0 in | Wt 142.6 lb

## 2014-05-22 DIAGNOSIS — I70219 Atherosclerosis of native arteries of extremities with intermittent claudication, unspecified extremity: Secondary | ICD-10-CM

## 2014-05-22 NOTE — Progress Notes (Signed)
Here today for followup of her recent left external iliac and common femoral endarterectomy and Dacron patch angioplasty for limiting claudication of his left leg. Surgery was on 05/02/2014 he was discharged home on postoperative day #1. He has continued to do well and has had resolution of his left leg claudication symptoms. His wife is here with him today and reports that she is concern about attempting to active.  On physical exam he does have a nicely healing left groin incision with the typical healing ridge. He has a normal femoral pulse. He does have 1+ dorsalis pedis pulses bilaterally.  Stable from recent left femoral endarterectomy and patch. History of prior bilateral common iliac artery stents which are doing quite well as well. We'll continue his walking program. We'll see him again in 6 months with repeat ankle arm index.

## 2014-06-26 ENCOUNTER — Other Ambulatory Visit: Payer: Self-pay | Admitting: Medical Oncology

## 2014-06-27 ENCOUNTER — Other Ambulatory Visit (HOSPITAL_BASED_OUTPATIENT_CLINIC_OR_DEPARTMENT_OTHER): Payer: Commercial Managed Care - HMO

## 2014-06-27 DIAGNOSIS — C9000 Multiple myeloma not having achieved remission: Secondary | ICD-10-CM

## 2014-06-27 LAB — COMPREHENSIVE METABOLIC PANEL (CC13)
ALT: 12 U/L (ref 0–55)
AST: 11 U/L (ref 5–34)
Albumin: 2.7 g/dL — ABNORMAL LOW (ref 3.5–5.0)
Alkaline Phosphatase: 49 U/L (ref 40–150)
Anion Gap: 6 mEq/L (ref 3–11)
BUN: 23.3 mg/dL (ref 7.0–26.0)
CHLORIDE: 105 meq/L (ref 98–109)
CO2: 28 mEq/L (ref 22–29)
Calcium: 9.7 mg/dL (ref 8.4–10.4)
Creatinine: 1.2 mg/dL (ref 0.7–1.3)
GLUCOSE: 101 mg/dL (ref 70–140)
Potassium: 3.8 mEq/L (ref 3.5–5.1)
Sodium: 139 mEq/L (ref 136–145)
Total Bilirubin: 0.45 mg/dL (ref 0.20–1.20)
Total Protein: 6.2 g/dL — ABNORMAL LOW (ref 6.4–8.3)

## 2014-06-27 LAB — CBC WITH DIFFERENTIAL/PLATELET
BASO%: 0.3 % (ref 0.0–2.0)
BASOS ABS: 0 10*3/uL (ref 0.0–0.1)
EOS ABS: 0.1 10*3/uL (ref 0.0–0.5)
EOS%: 0.7 % (ref 0.0–7.0)
HCT: 28.2 % — ABNORMAL LOW (ref 38.4–49.9)
HEMOGLOBIN: 9.5 g/dL — AB (ref 13.0–17.1)
LYMPH#: 1 10*3/uL (ref 0.9–3.3)
LYMPH%: 14.7 % (ref 14.0–49.0)
MCH: 36.6 pg — ABNORMAL HIGH (ref 27.2–33.4)
MCHC: 33.6 g/dL (ref 32.0–36.0)
MCV: 108.9 fL — ABNORMAL HIGH (ref 79.3–98.0)
MONO#: 0.2 10*3/uL (ref 0.1–0.9)
MONO%: 2.9 % (ref 0.0–14.0)
NEUT%: 81.4 % — ABNORMAL HIGH (ref 39.0–75.0)
NEUTROS ABS: 5.8 10*3/uL (ref 1.5–6.5)
Platelets: 193 10*3/uL (ref 140–400)
RBC: 2.59 10*6/uL — ABNORMAL LOW (ref 4.20–5.82)
RDW: 16.2 % — AB (ref 11.0–14.6)
WBC: 7.1 10*3/uL (ref 4.0–10.3)

## 2014-06-27 LAB — LACTATE DEHYDROGENASE (CC13): LDH: 130 U/L (ref 125–245)

## 2014-06-29 LAB — IGG, IGA, IGM
IGA: 968 mg/dL — AB (ref 68–379)
IgG (Immunoglobin G), Serum: 82 mg/dL — ABNORMAL LOW (ref 650–1600)

## 2014-06-29 LAB — KAPPA/LAMBDA LIGHT CHAINS
Kappa free light chain: 0.23 mg/dL — ABNORMAL LOW (ref 0.33–1.94)
Kappa:Lambda Ratio: 0.04 — ABNORMAL LOW (ref 0.26–1.65)
Lambda Free Lght Chn: 5.9 mg/dL — ABNORMAL HIGH (ref 0.57–2.63)

## 2014-06-29 LAB — BETA 2 MICROGLOBULIN, SERUM: BETA 2 MICROGLOBULIN: 5.48 mg/L — AB (ref <=2.51)

## 2014-07-04 ENCOUNTER — Ambulatory Visit (HOSPITAL_BASED_OUTPATIENT_CLINIC_OR_DEPARTMENT_OTHER): Payer: Commercial Managed Care - HMO | Admitting: Internal Medicine

## 2014-07-04 ENCOUNTER — Encounter: Payer: Self-pay | Admitting: Internal Medicine

## 2014-07-04 ENCOUNTER — Telehealth: Payer: Self-pay | Admitting: Internal Medicine

## 2014-07-04 VITALS — BP 124/58 | HR 67 | Temp 97.8°F | Resp 18 | Ht 65.0 in | Wt 142.7 lb

## 2014-07-04 DIAGNOSIS — D649 Anemia, unspecified: Secondary | ICD-10-CM

## 2014-07-04 DIAGNOSIS — C9000 Multiple myeloma not having achieved remission: Secondary | ICD-10-CM

## 2014-07-04 NOTE — Progress Notes (Signed)
[place] [phone]   Fax:([phone]  OFFICE PROGRESS NOTE  Mathews Argyle, MD [address] [place] [address]  DIAGNOSIS: Multiple myeloma diagnosed in June of 2015  PRIOR THERAPY: None  CURRENT THERAPY: Observation.  INTERVAL HISTORY: Eric [name] 80 y.o. male returns to the clinic today for followup visit accompanied by his wife and daughter. The patient is feeling fine today with no specific complaints. He denied having any significant fatigue or weakness. He denied having any weight loss or night sweats. He has no chest pain, shortness of breath, cough or hemoptysis. He has repeat myeloma panel and he is here today for evaluation and discussion of his lab results.  MEDICAL HISTORY: Past Medical History  Diagnosis Date  . Peripheral vascular disease   . Transient global amnesia   . Hypercholesterolemia   . Mesenteric thrombosis     post colon resection  . Hypertension   . Macrocytic anemia   . Glaucoma   . Osteoporosis   . GERD (gastroesophageal reflux disease)   . Cholelithiasis   . Shingles 2009  . Pneumonia 02-2011    left lower lobe  . CAD (coronary artery disease)   . Colon cancer     Multiple Mylemona  . Stroke     'Mini Stroke"  . Dementia   . Skin cancer     ears    ALLERGIES:  has No Known Allergies.  MEDICATIONS:  Current Outpatient Prescriptions  Medication Sig Dispense Refill  . alendronate (FOSAMAX) 40 MG tablet Take 40 mg by mouth every 7 (seven) days. Take with a full glass of water on an empty stomach. On Sunday      . aspirin EC 81 MG tablet Take 81 mg by mouth daily.      . Calcium Citrate (CITRACAL PO) Take 1 tablet by mouth every evening.       . clonazePAM (KLONOPIN) 0.5 MG tablet Take 0.5 mg by mouth at bedtime.       Marland Kitchen esomeprazole (NEXIUM) 40 MG capsule Take 20 mg by mouth 2 (two) times daily.       . hydrochlorothiazide (HYDRODIURIL) 25 MG tablet Take 25 mg by mouth  daily.      Marland Kitchen latanoprost (XALATAN) 0.005 % ophthalmic solution Place 1 drop into both eyes at bedtime.       Marland Kitchen lisinopril (PRINIVIL,ZESTRIL) 5 MG tablet Take 5 mg by mouth daily.      . Multiple Vitamin (MULTIVITAMIN WITH MINERALS) TABS tablet Take 1 tablet by mouth every evening.      . naproxen sodium (ANAPROX) 220 MG tablet Take 440 mg by mouth 2 (two) times daily with a meal.      . Omega-3 Fatty Acids (FISH OIL) 1000 MG CAPS Take 1,000 mg by mouth 2 (two) times daily.       . simvastatin (ZOCOR) 20 MG tablet Take 20 mg by mouth at bedtime.       . traMADol (ULTRAM) 50 MG tablet Take 1 tablet (50 mg total) by mouth every 6 (six) hours as needed.  20 tablet  0   No current facility-administered medications for this visit.    SURGICAL HISTORY:  Past Surgical History  Procedure Laterality Date  . Laminectomies      multiple  . Exploratory laparotomy      treat twisted intestines  . Colectomy      to remove adenoma  . Femoral artery stenting    .  Back surgery      lower back  . Endarterectomy femoral Left 05/02/2014    Procedure: LEFT FEMORAL ENDARTERECTOMY ;  Surgeon: Rosetta Posner, MD;  Location: [facility] OR;  Service: Vascular;  Laterality: Left;    REVIEW OF SYSTEMS:  Constitutional: negative Eyes: negative Ears, nose, mouth, throat, and face: negative Respiratory: negative Cardiovascular: negative Gastrointestinal: negative Genitourinary:negative Integument/breast: negative Hematologic/lymphatic: negative Musculoskeletal:negative Neurological: negative Behavioral/Psych: negative Endocrine: negative Allergic/Immunologic: negative   PHYSICAL EXAMINATION: General appearance: alert, cooperative and no distress Head: Normocephalic, without obvious abnormality, atraumatic Neck: no adenopathy, no JVD, supple, symmetrical, trachea midline and thyroid not enlarged, symmetric, no tenderness/mass/nodules Lymph nodes: Cervical, supraclavicular, and axillary nodes normal. Resp: clear  to auscultation bilaterally Back: symmetric, no curvature. ROM normal. No CVA tenderness. Cardio: regular rate and rhythm, S1, S2 normal, no murmur, click, rub or gallop GI: soft, non-tender; bowel sounds normal; no masses,  no organomegaly Extremities: extremities normal, atraumatic, no cyanosis or edema Neurologic: Alert and oriented X 3, normal strength and tone. Normal symmetric reflexes. Normal coordination and gait  ECOG PERFORMANCE STATUS: 0 - Asymptomatic  There were no vitals taken for this visit.  LABORATORY DATA: Lab Results  Component Value Date   WBC 7.1 06/27/2014   HGB 9.5* 06/27/2014   HCT 28.2* 06/27/2014   MCV 108.9* 06/27/2014   PLT 193 06/27/2014      Chemistry      Component Value Date/Time   NA 139 06/27/2014 0804   NA 143 05/03/2014 0351   K 3.8 06/27/2014 0804   K 4.2 05/03/2014 0351   CL 105 05/03/2014 0351   CO2 28 06/27/2014 0804   CO2 29 05/03/2014 0351   BUN 23.3 06/27/2014 0804   BUN 13 05/03/2014 0351   CREATININE 1.2 06/27/2014 0804   CREATININE 0.89 05/03/2014 0351      Component Value Date/Time   CALCIUM 9.7 06/27/2014 0804   CALCIUM 8.8 05/03/2014 0351   ALKPHOS 49 06/27/2014 0804   ALKPHOS 77 04/19/2014 0947   AST 11 06/27/2014 0804   AST 18 04/19/2014 0947   ALT 12 06/27/2014 0804   ALT 16 04/19/2014 0947   BILITOT 0.45 06/27/2014 0804   BILITOT <0.2* 04/19/2014 0947       RADIOGRAPHIC STUDIES:  ASSESSMENT AND PLAN: This is a very pleasant 80 years old white male recently diagnosed with multiple myeloma but currently asymptomatic with no lytic lesions or declined of his renal function. He has mild anemia but does not complain of any significant fatigue and weakness. His myeloma panel today showed no significant evidence for disease progression.. I discussed the lab result with the patient and his family. I recommended for him to continue on observation with repeat myeloma panel in 3 months. He was advised to call immediately if he has any concerning symptoms  in the interval. The patient voices understanding of current disease status and treatment options and is in agreement with the current care plan.  All questions were answered. The patient knows to call the clinic with any problems, questions or concerns. We can certainly see the patient much sooner if necessary.  Disclaimer: This note was dictated with voice recognition software. Similar sounding words can inadvertently be transcribed and may not be corrected upon review.

## 2014-07-04 NOTE — Telephone Encounter (Signed)
gv and printed appt sched and avs for pt for Jan 2016 °

## 2014-07-09 ENCOUNTER — Telehealth: Payer: Self-pay | Admitting: *Deleted

## 2014-07-09 NOTE — Telephone Encounter (Signed)
[name] called wanting to know whether pt will ever need to get treatment.  Informed her that pt is on observation at this time, when he comes in to see Dr Vista Mink and if lab work changes, at that point Dr Vista Mink would discuss options if it was appropriate.  She verbalized understanding.

## 2014-08-13 ENCOUNTER — Telehealth: Payer: Self-pay | Admitting: Medical Oncology

## 2014-08-13 NOTE — Telephone Encounter (Signed)
I reinforced that pt is under observation only for his myeloma and not getting treatment at this time based on his labs and kidney function and no bone involvement.NOte faxed to [place].

## 2014-08-16 ENCOUNTER — Telehealth: Payer: Self-pay | Admitting: Internal Medicine

## 2014-08-16 NOTE — Telephone Encounter (Signed)
MEDICAL RECORDS FAXED TO DR. Felipa Eth @ [phone].

## 2014-09-06 ENCOUNTER — Encounter (HOSPITAL_COMMUNITY): Payer: Self-pay | Admitting: Vascular Surgery

## 2014-10-03 ENCOUNTER — Other Ambulatory Visit (HOSPITAL_BASED_OUTPATIENT_CLINIC_OR_DEPARTMENT_OTHER): Payer: PPO

## 2014-10-03 DIAGNOSIS — C9000 Multiple myeloma not having achieved remission: Secondary | ICD-10-CM

## 2014-10-03 DIAGNOSIS — D649 Anemia, unspecified: Secondary | ICD-10-CM

## 2014-10-03 LAB — COMPREHENSIVE METABOLIC PANEL (CC13)
ALK PHOS: 61 U/L (ref 40–150)
ALT: 18 U/L (ref 0–55)
ANION GAP: 8 meq/L (ref 3–11)
AST: 18 U/L (ref 5–34)
Albumin: 3.5 g/dL (ref 3.5–5.0)
BUN: 14.9 mg/dL (ref 7.0–26.0)
CALCIUM: 9.7 mg/dL (ref 8.4–10.4)
CHLORIDE: 102 meq/L (ref 98–109)
CO2: 31 mEq/L — ABNORMAL HIGH (ref 22–29)
Creatinine: 1.1 mg/dL (ref 0.7–1.3)
EGFR: 63 mL/min/{1.73_m2} — AB (ref >90)
GLUCOSE: 78 mg/dL (ref 70–140)
POTASSIUM: 4.1 meq/L (ref 3.5–5.1)
SODIUM: 141 meq/L (ref 136–145)
Total Bilirubin: 0.28 mg/dL (ref 0.20–1.20)
Total Protein: 6.9 g/dL (ref 6.4–8.3)

## 2014-10-03 LAB — LACTATE DEHYDROGENASE (CC13): LDH: 140 U/L (ref 125–245)

## 2014-10-03 LAB — CBC WITH DIFFERENTIAL/PLATELET
BASO%: 0.6 % (ref 0.0–2.0)
Basophils Absolute: 0 10*3/uL (ref 0.0–0.1)
EOS%: 4.2 % (ref 0.0–7.0)
Eosinophils Absolute: 0.1 10*3/uL (ref 0.0–0.5)
HCT: 33.6 % — ABNORMAL LOW (ref 38.4–49.9)
HEMOGLOBIN: 11 g/dL — AB (ref 13.0–17.1)
LYMPH%: 44.3 % (ref 14.0–49.0)
MCH: 35.5 pg — ABNORMAL HIGH (ref 27.2–33.4)
MCHC: 32.8 g/dL (ref 32.0–36.0)
MCV: 108.4 fL — AB (ref 79.3–98.0)
MONO#: 0.3 10*3/uL (ref 0.1–0.9)
MONO%: 7.7 % (ref 0.0–14.0)
NEUT#: 1.5 10*3/uL (ref 1.5–6.5)
NEUT%: 43.2 % (ref 39.0–75.0)
PLATELETS: 222 10*3/uL (ref 140–400)
RBC: 3.1 10*6/uL — ABNORMAL LOW (ref 4.20–5.82)
RDW: 15.3 % — ABNORMAL HIGH (ref 11.0–14.6)
WBC: 3.5 10*3/uL — ABNORMAL LOW (ref 4.0–10.3)
lymph#: 1.6 10*3/uL (ref 0.9–3.3)

## 2014-10-05 LAB — BETA 2 MICROGLOBULIN, SERUM: Beta-2 Microglobulin: 4.72 mg/L — ABNORMAL HIGH (ref <=2.51)

## 2014-10-05 LAB — IGG, IGA, IGM
IgA: 721 mg/dL — ABNORMAL HIGH (ref 68–379)
IgG (Immunoglobin G), Serum: 109 mg/dL — ABNORMAL LOW (ref 650–1600)
IgM, Serum: <5 mg/dL — ABNORMAL LOW (ref 41–251)

## 2014-10-05 LAB — KAPPA/LAMBDA LIGHT CHAINS
KAPPA FREE LGHT CHN: 0.03 mg/dL — AB (ref 0.33–1.94)
Kappa:Lambda Ratio: 0 — ABNORMAL LOW (ref 0.26–1.65)
Lambda Free Lght Chn: 11.8 mg/dL — ABNORMAL HIGH (ref 0.57–2.63)

## 2014-10-10 ENCOUNTER — Telehealth: Payer: Self-pay | Admitting: Internal Medicine

## 2014-10-10 ENCOUNTER — Ambulatory Visit (HOSPITAL_BASED_OUTPATIENT_CLINIC_OR_DEPARTMENT_OTHER): Payer: PPO | Admitting: Internal Medicine

## 2014-10-10 ENCOUNTER — Encounter: Payer: Self-pay | Admitting: Internal Medicine

## 2014-10-10 VITALS — BP 119/57 | HR 69 | Temp 98.0°F | Resp 18 | Ht 65.0 in | Wt 148.0 lb

## 2014-10-10 DIAGNOSIS — M545 Low back pain: Secondary | ICD-10-CM | POA: Diagnosis not present

## 2014-10-10 DIAGNOSIS — C9000 Multiple myeloma not having achieved remission: Secondary | ICD-10-CM

## 2014-10-10 DIAGNOSIS — D649 Anemia, unspecified: Secondary | ICD-10-CM

## 2014-10-10 DIAGNOSIS — M549 Dorsalgia, unspecified: Secondary | ICD-10-CM | POA: Insufficient documentation

## 2014-10-10 NOTE — Progress Notes (Signed)
    [facility] [phone]   [phone]  OFFICE PROGRESS NOTE  STONEKING,HAL THOMAS, MD [address] [place]  [address]  DIAGNOSIS: Multiple myeloma diagnosed in June of 2015  PRIOR THERAPY: None  CURRENT THERAPY: Observation.  INTERVAL HISTORY: Eric [name] 81 y.o. male returns to the clinic today for followup visit accompanied by his wife and daughter. The patient is feeling fine today with no specific complaints except for mild low back pain with radiation to the iliac crests bilaterally. He has been working hard then begin in his yard recently. He is currently on tramadol by Dr. Stoneking. He gets some relief by taking Aleve in the morning. He denied having any significant fatigue or weakness. He denied having any weight loss or night sweats. He has no chest pain, shortness of breath, cough or hemoptysis. He has repeat myeloma panel and he is here today for evaluation and discussion of his lab results.  MEDICAL HISTORY: Past Medical History  Diagnosis Date  . Peripheral vascular disease   . Transient global amnesia   . Hypercholesterolemia   . Mesenteric thrombosis     post colon resection  . Hypertension   . Macrocytic anemia   . Glaucoma   . Osteoporosis   . GERD (gastroesophageal reflux disease)   . Cholelithiasis   . Shingles 2009  . Pneumonia 02-2011    left lower lobe  . CAD (coronary artery disease)   . Colon cancer     Multiple Mylemona  . Stroke     'Mini Stroke"  . Dementia   . Skin cancer     ears    ALLERGIES:  has No Known Allergies.  MEDICATIONS:  Current Outpatient Prescriptions  Medication Sig Dispense Refill  . aspirin EC 81 MG tablet Take 81 mg by mouth daily.    . clonazePAM (KLONOPIN) 0.5 MG tablet Take 0.5 mg by mouth at bedtime.     . esomeprazole (NEXIUM) 40 MG capsule Take 20 mg by mouth 2 (two) times daily.     . hydrochlorothiazide (HYDRODIURIL) 25 MG tablet Take 25 mg by mouth  daily.    . latanoprost (XALATAN) 0.005 % ophthalmic solution Place 1 drop into both eyes at bedtime.     . lisinopril (PRINIVIL,ZESTRIL) 5 MG tablet Take 5 mg by mouth daily.    . Multiple Vitamin (MULTIVITAMIN WITH MINERALS) TABS tablet Take 1 tablet by mouth every evening.    . naproxen sodium (ANAPROX) 220 MG tablet Take 440 mg by mouth 2 (two) times daily with a meal.    . simvastatin (ZOCOR) 20 MG tablet Take 20 mg by mouth at bedtime.     . traMADol (ULTRAM) 50 MG tablet Take 1 tablet (50 mg total) by mouth every 6 (six) hours as needed. 20 tablet 0   No current facility-administered medications for this visit.    SURGICAL HISTORY:  Past Surgical History  Procedure Laterality Date  . Laminectomies      multiple  . Exploratory laparotomy      treat twisted intestines  . Colectomy      to remove adenoma  . Femoral artery stenting    . Back surgery      lower back  . Endarterectomy femoral Left 05/02/2014    Procedure: LEFT FEMORAL ENDARTERECTOMY ;  Surgeon: Todd F Early, MD;  Location: [facility] OR;  Service: Vascular;  Laterality: Left;  . Abdominal aortagram N/A 03/28/2014    Procedure: ABDOMINAL   AORTAGRAM;  Surgeon: Todd F Early, MD;  Location: [facility] CATH LAB;  Service: Cardiovascular;  Laterality: N/A;    REVIEW OF SYSTEMS:  A comprehensive review of systems was negative except for: Musculoskeletal: positive for back pain   PHYSICAL EXAMINATION: General appearance: alert, cooperative and no distress Head: Normocephalic, without obvious abnormality, atraumatic Neck: no adenopathy, no JVD, supple, symmetrical, trachea midline and thyroid not enlarged, symmetric, no tenderness/mass/nodules Lymph nodes: Cervical, supraclavicular, and axillary nodes normal. Resp: clear to auscultation bilaterally Back: symmetric, no curvature. ROM normal. No CVA tenderness. Cardio: regular rate and rhythm, S1, S2 normal, no murmur, click, rub or gallop GI: soft, non-tender; bowel sounds normal; no masses,   no organomegaly Extremities: extremities normal, atraumatic, no cyanosis or edema Neurologic: Alert and oriented X 3, normal strength and tone. Normal symmetric reflexes. Normal coordination and gait  ECOG PERFORMANCE STATUS: 0 - Asymptomatic  Blood pressure 119/57, pulse 69, temperature 98 F (36.7 C), temperature source Oral, resp. rate 18, height 5' 5" (1.651 m), weight 148 lb (67.132 kg), SpO2 100 %.  LABORATORY DATA: Lab Results  Component Value Date   WBC 3.5* 10/03/2014   HGB 11.0* 10/03/2014   HCT 33.6* 10/03/2014   MCV 108.4* 10/03/2014   PLT 222 10/03/2014      Chemistry      Component Value Date/Time   NA 141 10/03/2014 0814   NA 143 05/03/2014 0351   K 4.1 10/03/2014 0814   K 4.2 05/03/2014 0351   CL 105 05/03/2014 0351   CO2 31* 10/03/2014 0814   CO2 29 05/03/2014 0351   BUN 14.9 10/03/2014 0814   BUN 13 05/03/2014 0351   CREATININE 1.1 10/03/2014 0814   CREATININE 0.89 05/03/2014 0351      Component Value Date/Time   CALCIUM 9.7 10/03/2014 0814   CALCIUM 8.8 05/03/2014 0351   ALKPHOS 61 10/03/2014 0814   ALKPHOS 77 04/19/2014 0947   AST 18 10/03/2014 0814   AST 18 04/19/2014 0947   ALT 18 10/03/2014 0814   ALT 16 04/19/2014 0947   BILITOT 0.28 10/03/2014 0814   BILITOT <0.2* 04/19/2014 0947       RADIOGRAPHIC STUDIES:  ASSESSMENT AND PLAN: This is a very pleasant 81 years old white male recently diagnosed with multiple myeloma but currently asymptomatic with no lytic lesions or declined of his renal function. He has mild anemia but does not complain of any significant fatigue and weakness. His myeloma panel today showed no significant evidence for disease progression except for elevated free lambda light chain. It has been fluctuating up-and-down recently. I discussed the lab result with the patient and his family. I recommended for him to continue on observation with repeat myeloma panel in 2 months. For pain management she will continue on  tramadol and Aleve. I will consider repeat imaging studies with either x-ray or MRI of the lumbar spine if no improvement. He was advised to call immediately if he has any concerning symptoms in the interval. The patient voices understanding of current disease status and treatment options and is in agreement with the current care plan.  All questions were answered. The patient knows to call the clinic with any problems, questions or concerns. We can certainly see the patient much sooner if necessary.  Disclaimer: This note was dictated with voice recognition software. Similar sounding words can inadvertently be transcribed and may not be corrected upon review.       

## 2014-10-10 NOTE — Telephone Encounter (Signed)
Gave avs & cal for March. °

## 2014-11-13 ENCOUNTER — Other Ambulatory Visit: Payer: Self-pay | Admitting: *Deleted

## 2014-11-13 DIAGNOSIS — I739 Peripheral vascular disease, unspecified: Secondary | ICD-10-CM

## 2014-11-19 ENCOUNTER — Encounter: Payer: Self-pay | Admitting: Family

## 2014-11-20 ENCOUNTER — Ambulatory Visit (HOSPITAL_COMMUNITY)
Admission: RE | Admit: 2014-11-20 | Discharge: 2014-11-20 | Disposition: A | Discharge status: Home / Self Care | Payer: PPO | Source: Ambulatory Visit | Attending: Family | Admitting: Family

## 2014-11-20 ENCOUNTER — Encounter: Payer: Self-pay | Admitting: Family

## 2014-11-20 ENCOUNTER — Ambulatory Visit (INDEPENDENT_AMBULATORY_CARE_PROVIDER_SITE_OTHER): Payer: PPO | Admitting: Family

## 2014-11-20 ENCOUNTER — Other Ambulatory Visit: Payer: Self-pay | Admitting: Vascular Surgery

## 2014-11-20 VITALS — BP 123/74 | HR 65 | Resp 16 | Ht 65.0 in | Wt 146.0 lb

## 2014-11-20 DIAGNOSIS — F17200 Nicotine dependence, unspecified, uncomplicated: Secondary | ICD-10-CM | POA: Diagnosis not present

## 2014-11-20 DIAGNOSIS — I70219 Atherosclerosis of native arteries of extremities with intermittent claudication, unspecified extremity: Secondary | ICD-10-CM | POA: Insufficient documentation

## 2014-11-20 DIAGNOSIS — I739 Peripheral vascular disease, unspecified: Secondary | ICD-10-CM | POA: Diagnosis present

## 2014-11-20 DIAGNOSIS — Z48812 Encounter for surgical aftercare following surgery on the circulatory system: Secondary | ICD-10-CM

## 2014-11-20 DIAGNOSIS — Z720 Tobacco use: Secondary | ICD-10-CM

## 2014-11-20 DIAGNOSIS — Z9889 Other specified postprocedural states: Secondary | ICD-10-CM | POA: Insufficient documentation

## 2014-11-20 DIAGNOSIS — Z95828 Presence of other vascular implants and grafts: Secondary | ICD-10-CM

## 2014-11-20 NOTE — Progress Notes (Signed)
[facility] [place] HISTORY AND PHYSICAL -PAD  History of Present Illness Eric [name] is a 81 y.o. male patient of Dr. Donnetta Hutching who is s/p left external iliac and common femoral endarterectomy and Dacron patch angioplasty for limiting claudication of his left leg on 05/02/2014.He is also s/p bilateral common iliac artery stents.  bbb   Wife states pt may have had a TIA many years ago, none since then.  The patient reports New Medical or Surgical History: has bone cancer, his oncologist is Dr. Julien Nordmann.  Pt Diabetic: No Pt smoker: former smoker, quit in the late 1980's  Pt meds include: Statin :Yes ASA: Yes Other anticoagulants/antiplatelets: no  Past Medical History  Diagnosis Date  . Peripheral vascular disease   . Transient global amnesia   . Hypercholesterolemia   . Mesenteric thrombosis     post colon resection  . Hypertension   . Macrocytic anemia   . Glaucoma   . Osteoporosis   . GERD (gastroesophageal reflux disease)   . Cholelithiasis   . Shingles 2009  . Pneumonia 02-2011    left lower lobe  . CAD (coronary artery disease)   . Colon cancer     Multiple Mylemona  . Stroke     'Mini Stroke"  . Dementia   . Skin cancer     ears  . Peripheral arterial disease     Social History History  Substance Use Topics  . Smoking status: Former Smoker    Types: Cigarettes    Quit date: 09/28/1988  . Smokeless tobacco: Current User    Types: Chew  . Alcohol Use: No     Comment: rare    Family History Family History  Problem Relation Age of Onset  . Diabetes Mother   . Heart disease Mother   . Hypertension Mother   . Heart disease Father   . Hypertension Father   . Diabetes Father   . Hyperlipidemia Father   . Peripheral vascular disease Father   . Cancer Brother   . Diabetes Brother   . Hypertension Brother   . Heart disease Brother     before age 48  . Cancer Sister     Past Surgical History  Procedure Laterality Date  .  Laminectomies      multiple  . Exploratory laparotomy      treat twisted intestines  . Colectomy      to remove adenoma  . Femoral artery stenting    . Back surgery      lower back  . Endarterectomy femoral Left 05/02/2014    Procedure: LEFT FEMORAL ENDARTERECTOMY ;  Surgeon: Rosetta Posner, MD;  Location: [facility];  Service: Vascular;  Laterality: Left;  . Abdominal aortagram N/A 03/28/2014    Procedure: ABDOMINAL AORTAGRAM;  Surgeon: Rosetta Posner, MD;  Location: [facility] CATH LAB;  Service: Cardiovascular;  Laterality: N/A;    No Known Allergies  Current Outpatient Prescriptions  Medication Sig Dispense Refill  . aspirin EC 81 MG tablet Take 81 mg by mouth daily.    . clonazePAM (KLONOPIN) 0.5 MG tablet Take 0.5 mg by mouth at bedtime.     Marland Kitchen esomeprazole (NEXIUM) 40 MG capsule Take 20 mg by mouth 2 (two) times daily.     . hydrochlorothiazide (HYDRODIURIL) 25 MG tablet Take 25 mg by mouth daily.    Marland Kitchen latanoprost (XALATAN) 0.005 % ophthalmic solution Place 1 drop into both eyes at bedtime.     Marland Kitchen lisinopril (PRINIVIL,ZESTRIL) 5  MG tablet Take 5 mg by mouth daily.    . Multiple Vitamin (MULTIVITAMIN WITH MINERALS) TABS tablet Take 1 tablet by mouth every evening.    . naproxen sodium (ANAPROX) 220 MG tablet Take 440 mg by mouth 2 (two) times daily with a meal.    . simvastatin (ZOCOR) 20 MG tablet Take 20 mg by mouth at bedtime.     . traMADol (ULTRAM) 50 MG tablet Take 1 tablet (50 mg total) by mouth every 6 (six) hours as needed. 20 tablet 0   No current facility-administered medications for this visit.    ROS: See HPI for pertinent positives and negatives.   Physical Examination  Filed Vitals:   11/20/14 1006  BP: 123/74  Pulse: 65  Resp: 16  Height: 5\' 5"  (1.651 m)  Weight: 146 lb (66.225 kg)  SpO2: 99%   Body mass index is 24.3 kg/(m^2).  General: A&O x 3, WDWN. Gait: normal Eyes: PERRLA. Pulmonary: CTAB, without wheezes , rales or rhonchi. Cardiac: regular Rythm , without  detected murmur.         Carotid Bruits Right Left   Negative Negative  Aorta is not palpable. Radial pulses: 2+ palpable and =                           VASCULAR EXAM: Extremities without ischemic changes  without Gangrene; without open wounds.                                                                                                          LE Pulses Right Left       FEMORAL   palpable   palpable        POPLITEAL  not palpable   not palpable       POSTERIOR TIBIAL  not palpable   faintly palpable        DORSALIS PEDIS      ANTERIOR TIBIAL not palpable  not palpable    Abdomen: soft, NT, no masses. Skin: no rashes, no ulcers noted. Musculoskeletal: no muscle wasting or atrophy.  Neurologic: A&O X 3; Appropriate Affect ; SENSATION: normal; MOTOR FUNCTION:  moving all extremities equally, motor strength 5/5 throughout. Speech is fluent/normal. CN 2-12 intact.    Non-Invasive Vascular Imaging: DATE: 11/20/2014 ABI: RIGHT 1.09, Waveforms: tri and biphasic;  LEFT 1.05, Waveforms: biphasic   ASSESSMENT: [name] is a 82 y.o. male who is s/p left external iliac and common femoral endarterectomy and Dacron patch angioplasty for limiting claudication of his left leg on 05/02/2014. He is also s/p bilateral common iliac artery stents. He walks 30-60 minutes daily, including treadmill. He has mild claudication in left calf with walking, relieved to a great extent with vascular surgery.  Pt denies non healing wounds.b ABI's remain in the normal range with tri and biphasic waveforms.  PLAN:  I discussed in depth with the patient the nature of atherosclerosis, and emphasized the importance of maximal medical management including strict control of blood pressure, blood glucose, and lipid  levels, obtaining regular exercise, and continued cessation of smoking.  The patient is aware that without maximal medical management the underlying atherosclerotic disease process will progress,  limiting the benefit of any interventions.  Based on the patient's vascular studies and examination, pt will return to clinic in 1 year with ABI's and bilateral iliac artery stent Duplex.  The patient was given information about PAD including signs, symptoms, treatment, what symptoms should prompt the patient to seek immediate medical care, and risk reduction measures to take.  Clemon Chambers, RN, MSN, FNP-C [facility] Phone: [phone]  Clinic MD: Early  11/20/2014 10:16 AM

## 2014-11-20 NOTE — Patient Instructions (Addendum)
Peripheral Vascular Disease Peripheral Vascular Disease (PVD), also called Peripheral Arterial Disease (PAD), is a circulation problem caused by cholesterol (atherosclerotic plaque) deposits in the arteries. PVD commonly occurs in the lower extremities (legs) but it can occur in other areas of the body, such as your arms. The cholesterol buildup in the arteries reduces blood flow which can cause pain and other serious problems. The presence of PVD can place a person at risk for Coronary Artery Disease (CAD).  CAUSES  Causes of PVD can be many. It is usually associated with more than one risk factor such as:   High Cholesterol.  Smoking.  Diabetes.  Lack of exercise or inactivity.  High blood pressure (hypertension).  Obesity.  Family history. SYMPTOMS   When the lower extremities are affected, patients with PVD may experience:  Leg pain with exertion or physical activity. This is called INTERMITTENT CLAUDICATION. This may present as cramping or numbness with physical activity. The location of the pain is associated with the level of blockage. For example, blockage at the abdominal level (distal abdominal aorta) may result in buttock or hip pain. Lower leg arterial blockage may result in calf pain.  As PVD becomes more severe, pain can develop with less physical activity.  In people with severe PVD, leg pain may occur at rest.  Other PVD signs and symptoms:  Leg numbness or weakness.  Coldness in the affected leg or foot, especially when compared to the other leg.  A change in leg color.  Patients with significant PVD are more prone to ulcers or sores on toes, feet or legs. These may take longer to heal or may reoccur. The ulcers or sores can become infected.  If signs and symptoms of PVD are ignored, gangrene may occur. This can result in the loss of toes or loss of an entire limb.  Not all leg pain is related to PVD. Other medical conditions can cause leg pain such  as:  Blood clots (embolism) or Deep Vein Thrombosis.  Inflammation of the blood vessels (vasculitis).  Spinal stenosis. DIAGNOSIS  Diagnosis of PVD can involve several different types of tests. These can include:  Pulse Volume Recording Method (PVR). This test is simple, painless and does not involve the use of X-rays. PVR involves measuring and comparing the blood pressure in the arms and legs. An ABI (Ankle-Brachial Index) is calculated. The normal ratio of blood pressures is 1. As this number becomes smaller, it indicates more severe disease.  < 0.95 - indicates significant narrowing in one or more leg vessels.  <0.8 - there will usually be pain in the foot, leg or buttock with exercise.  <0.4 - will usually have pain in the legs at rest.  <0.25 - usually indicates limb threatening PVD.  Doppler detection of pulses in the legs. This test is painless and checks to see if you have a pulses in your legs/feet.  A dye or contrast material (a substance that highlights the blood vessels so they show up on x-ray) may be given to help your caregiver better see the arteries for the following tests. The dye is eliminated from your body by the kidney's. Your caregiver may order blood work to check your kidney function and other laboratory values before the following tests are performed:  Magnetic Resonance Angiography (MRA). An MRA is a picture study of the blood vessels and arteries. The MRA machine uses a large magnet to produce images of the blood vessels.  Computed Tomography Angiography (CTA). A CTA   is a specialized x-ray that looks at how the blood flows in your blood vessels. An IV may be inserted into your arm so contrast dye can be injected.  Angiogram. Is a procedure that uses x-rays to look at your blood vessels. This procedure is minimally invasive, meaning a small incision (cut) is made in your groin. A small tube (catheter) is then inserted into the artery of your groin. The catheter  is guided to the blood vessel or artery your caregiver wants to examine. Contrast dye is injected into the catheter. X-rays are then taken of the blood vessel or artery. After the images are obtained, the catheter is taken out. TREATMENT  Treatment of PVD involves many interventions which may include:  Lifestyle changes:  Quitting smoking.  Exercise.  Following a low fat, low cholesterol diet.  Control of diabetes.  Foot care is very important to the PVD patient. Good foot care can help prevent infection.  Medication:  Cholesterol-lowering medicine.  Blood pressure medicine.  Anti-platelet drugs.  Certain medicines may reduce symptoms of Intermittent Claudication.  Interventional/Surgical options:  Angioplasty. An Angioplasty is a procedure that inflates a balloon in the blocked artery. This opens the blocked artery to improve blood flow.  Stent Implant. A wire mesh tube (stent) is placed in the artery. The stent expands and stays in place, allowing the artery to remain open.  Peripheral Bypass Surgery. This is a surgical procedure that reroutes the blood around a blocked artery to help improve blood flow. This type of procedure may be performed if Angioplasty or stent implants are not an option. SEEK IMMEDIATE MEDICAL CARE IF:   You develop pain or numbness in your arms or legs.  Your arm or leg turns cold, becomes blue in color.  You develop redness, warmth, swelling and pain in your arms or legs. MAKE SURE YOU:   Understand these instructions.  Will watch your condition.  Will get help right away if you are not doing well or get worse. Document Released: 10/22/2004 Document Revised: 12/07/2011 Document Reviewed: 09/18/2008 [facility] Patient Information 2015 [place], [place]. This information is not intended to replace advice given to you by your health care provider. Make sure you discuss any questions you have with your health care provider.    Smokeless Tobacco  Use Smokeless tobacco is a loose, fine, or stringy tobacco. The tobacco is not smoked like a cigarette, but it is chewed or held in the lips or cheeks. It resembles tea and comes from the leaves of the tobacco plant. Smokeless tobacco is usually flavored, sweetened, or processed in some way. Although smokeless tobacco is not smoked into the lungs, its chemicals are absorbed through the membranes in the mouth and into the bloodstream. Its chemicals are also swallowed in saliva. The chemicals (nicotine and other toxins) are known to cause cancer. Smokeless tobacco contains up to 28 differentcarcinogens. CAUSES Nicotine is addictive. Smokeless tobacco contains nicotine, which is a stimulant. This stimulant can give you a "buzz" or altered state. People can become addicted to the feeling it delivers.  SYMPTOMS Smokeless tobacco can cause health problems, including:  Bad breath.  Yellow-brown teeth.  Mouth sores.  Cracking and bleeding lips.  Gum disease, gum recession, and bone loss around the teeth.  Tooth decay.  Increased or irregular heart rate.  High blood pressure, heart disease, and stroke.  Cancer of the mouth, lips, tongue, pancreas, voice box (larynx), esophagus, colon, and bladder.  Precancerous lesion of the soft tissues of the mouth (  leukoplakia).  Loss of your sense of taste. TREATMENT Talk with your caregiver about ways you can quit. Quitting tobacco is a good decision for your health. Nicotine is addictive, but several options are available to help you quit including:  Nicotine replacement therapy (gum or patch).  Support and cessation programs. The following tips can help you quit:  Write down the reasons you would like to quit and look at them often.  Set a date during a low stress time to stop or cut back.  Ask family and friends for their support.  Remove all tobacco products from your home and work.  Replace the chewing tobacco with things like beef  jerky, sunflower seeds, or shredded coconut.  Avoid situations that may make you want to chew tobacco.  Exercise and eat a healthy diet.  When you crave tobacco, distract yourself with drinking water, sugarless chewing gum, sugarless hard candy, exercising, or deep breathing. HOME CARE INSTRUCTIONS  See your dentist for regular oral health exams every 6 months.  Follow up with your caregiver as recommended. SEEK MEDICAL CARE OR DENTAL CARE IF:  You have bleeding or cracking lips, gums, or cheeks.  You have mouth sores, discolorations, or pain.  You have tooth pain.  You develop persistent irritation, burning, or sores in the mouth.  You have pain, tenderness, or numbness in the mouth.  You develop a lump, bumpy patch, or hardened skin inside the mouth.  The color changes inside your mouth (gray, white, or red spots).  You have difficulty chewing, swallowing, or speaking. Document Released: 02/16/2011 Document Revised: 12/07/2011 Document Reviewed: 02/16/2011 [facility] Patient Information 2015 [place], [place]. This information is not intended to replace advice given to you by your health care provider. Make sure you discuss any questions you have with your health care provider.

## 2014-11-21 NOTE — Addendum Note (Signed)
Addended by: Mena Goes on: 11/21/2014 01:55 PM   Modules accepted: Orders

## 2014-12-04 ENCOUNTER — Telehealth: Payer: Self-pay | Admitting: Internal Medicine

## 2014-12-04 NOTE — Telephone Encounter (Signed)
lvm for pt regarding to 3.16 appt moved to 3.31....mailed pt appt sched and letter

## 2014-12-05 ENCOUNTER — Other Ambulatory Visit (HOSPITAL_BASED_OUTPATIENT_CLINIC_OR_DEPARTMENT_OTHER): Payer: PPO

## 2014-12-05 ENCOUNTER — Other Ambulatory Visit: Payer: PRIVATE HEALTH INSURANCE

## 2014-12-05 DIAGNOSIS — C9000 Multiple myeloma not having achieved remission: Secondary | ICD-10-CM

## 2014-12-05 LAB — CBC WITH DIFFERENTIAL/PLATELET
BASO%: 0.4 % (ref 0.0–2.0)
Basophils Absolute: 0 10*3/uL (ref 0.0–0.1)
EOS%: 3.5 % (ref 0.0–7.0)
Eosinophils Absolute: 0.1 10*3/uL (ref 0.0–0.5)
HEMATOCRIT: 30.9 % — AB (ref 38.4–49.9)
HEMOGLOBIN: 10.1 g/dL — AB (ref 13.0–17.1)
LYMPH%: 38.2 % (ref 14.0–49.0)
MCH: 36 pg — ABNORMAL HIGH (ref 27.2–33.4)
MCHC: 32.9 g/dL (ref 32.0–36.0)
MCV: 109.4 fL — ABNORMAL HIGH (ref 79.3–98.0)
MONO#: 0.2 10*3/uL (ref 0.1–0.9)
MONO%: 6.2 % (ref 0.0–14.0)
NEUT%: 51.7 % (ref 39.0–75.0)
NEUTROS ABS: 2 10*3/uL (ref 1.5–6.5)
Platelets: 225 10*3/uL (ref 140–400)
RBC: 2.82 10*6/uL — ABNORMAL LOW (ref 4.20–5.82)
RDW: 15.7 % — ABNORMAL HIGH (ref 11.0–14.6)
WBC: 3.8 10*3/uL — ABNORMAL LOW (ref 4.0–10.3)
lymph#: 1.5 10*3/uL (ref 0.9–3.3)

## 2014-12-05 LAB — COMPREHENSIVE METABOLIC PANEL (CC13)
ALK PHOS: 62 U/L (ref 40–150)
ALT: 19 U/L (ref 0–55)
ANION GAP: 11 meq/L (ref 3–11)
AST: 20 U/L (ref 5–34)
Albumin: 3.3 g/dL — ABNORMAL LOW (ref 3.5–5.0)
BILIRUBIN TOTAL: 0.31 mg/dL (ref 0.20–1.20)
BUN: 13.5 mg/dL (ref 7.0–26.0)
CHLORIDE: 104 meq/L (ref 98–109)
CO2: 28 mEq/L (ref 22–29)
CREATININE: 0.9 mg/dL (ref 0.7–1.3)
Calcium: 9.5 mg/dL (ref 8.4–10.4)
EGFR: 77 mL/min/{1.73_m2} — ABNORMAL LOW (ref >90)
GLUCOSE: 116 mg/dL (ref 70–140)
Potassium: 3.8 mEq/L (ref 3.5–5.1)
Sodium: 144 mEq/L (ref 136–145)
TOTAL PROTEIN: 6.6 g/dL (ref 6.4–8.3)

## 2014-12-05 LAB — LACTATE DEHYDROGENASE (CC13): LDH: 161 U/L (ref 125–245)

## 2014-12-06 LAB — IGG, IGA, IGM
IgA: 1200 mg/dL — ABNORMAL HIGH (ref 68–379)
IgG (Immunoglobin G), Serum: 89 mg/dL — ABNORMAL LOW (ref 650–1600)
IgM, Serum: <5 mg/dL — ABNORMAL LOW (ref 41–251)

## 2014-12-06 LAB — KAPPA/LAMBDA LIGHT CHAINS
Kappa free light chain: 0.2 mg/dL — ABNORMAL LOW (ref 0.33–1.94)
Kappa:Lambda Ratio: 0.02 — ABNORMAL LOW (ref 0.26–1.65)
LAMBDA FREE LGHT CHN: 10.7 mg/dL — AB (ref 0.57–2.63)

## 2014-12-07 LAB — BETA 2 MICROGLOBULIN, SERUM: Beta-2 Microglobulin: 4.53 mg/L — ABNORMAL HIGH (ref <=2.51)

## 2014-12-12 ENCOUNTER — Ambulatory Visit: Payer: PRIVATE HEALTH INSURANCE | Admitting: Internal Medicine

## 2014-12-27 ENCOUNTER — Ambulatory Visit (HOSPITAL_BASED_OUTPATIENT_CLINIC_OR_DEPARTMENT_OTHER): Payer: PPO | Admitting: Internal Medicine

## 2014-12-27 ENCOUNTER — Telehealth: Payer: Self-pay | Admitting: Internal Medicine

## 2014-12-27 ENCOUNTER — Encounter: Payer: Self-pay | Admitting: Internal Medicine

## 2014-12-27 VITALS — BP 107/57 | HR 71 | Temp 97.9°F | Resp 18 | Ht 65.0 in | Wt 143.0 lb

## 2014-12-27 DIAGNOSIS — D649 Anemia, unspecified: Secondary | ICD-10-CM | POA: Diagnosis not present

## 2014-12-27 DIAGNOSIS — C9000 Multiple myeloma not having achieved remission: Secondary | ICD-10-CM | POA: Diagnosis not present

## 2014-12-27 NOTE — Progress Notes (Signed)
[facility] [phone]   [phone]  OFFICE PROGRESS NOTE  Mathews Argyle, MD [address] [place] [address]  DIAGNOSIS: Multiple myeloma diagnosed in June of 2015  PRIOR THERAPY: None  CURRENT THERAPY: Observation.  INTERVAL HISTORY: Eric [name] 81 y.o. male returns to the clinic today for followup visit accompanied by his wife and daughter. The patient is feeling fine today with no specific complaints. He continues to be very active and doing a lot of yard work. He denied having any significant fatigue or weakness. He denied having any weight loss or night sweats. He has no chest pain, shortness of breath, cough or hemoptysis. He has repeat myeloma panel and he is here today for evaluation and discussion of his lab results.  MEDICAL HISTORY: Past Medical History  Diagnosis Date  . Peripheral vascular disease   . Transient global amnesia   . Hypercholesterolemia   . Mesenteric thrombosis     post colon resection  . Hypertension   . Macrocytic anemia   . Glaucoma   . Osteoporosis   . GERD (gastroesophageal reflux disease)   . Cholelithiasis   . Shingles 2009  . Pneumonia 02-2011    left lower lobe  . CAD (coronary artery disease)   . Colon cancer     Multiple Mylemona  . Stroke     'Mini Stroke"  . Dementia   . Skin cancer     ears  . Peripheral arterial disease     ALLERGIES:  has No Known Allergies.  MEDICATIONS:  Current Outpatient Prescriptions  Medication Sig Dispense Refill  . aspirin EC 81 MG tablet Take 81 mg by mouth daily.    . clonazePAM (KLONOPIN) 0.5 MG tablet Take 0.5 mg by mouth at bedtime.     Marland Kitchen esomeprazole (NEXIUM) 40 MG capsule Take 20 mg by mouth 2 (two) times daily.     . hydrochlorothiazide (HYDRODIURIL) 25 MG tablet Take 25 mg by mouth daily.    Marland Kitchen latanoprost (XALATAN) 0.005 % ophthalmic solution Place 1 drop into both eyes at bedtime.     Marland Kitchen lisinopril (PRINIVIL,ZESTRIL) 5  MG tablet Take 5 mg by mouth daily.    . Multiple Vitamin (MULTIVITAMIN WITH MINERALS) TABS tablet Take 1 tablet by mouth every evening.    . naproxen sodium (ANAPROX) 220 MG tablet Take 440 mg by mouth 2 (two) times daily with a meal.    . simvastatin (ZOCOR) 20 MG tablet Take 20 mg by mouth at bedtime.     . traMADol (ULTRAM) 50 MG tablet Take 1 tablet (50 mg total) by mouth every 6 (six) hours as needed. 20 tablet 0   No current facility-administered medications for this visit.    SURGICAL HISTORY:  Past Surgical History  Procedure Laterality Date  . Laminectomies      multiple  . Exploratory laparotomy      treat twisted intestines  . Colectomy      to remove adenoma  . Femoral artery stenting    . Back surgery      lower back  . Endarterectomy femoral Left 05/02/2014    Procedure: LEFT FEMORAL ENDARTERECTOMY ;  Surgeon: Rosetta Posner, MD;  Location: [facility];  Service: Vascular;  Laterality: Left;  . Abdominal aortagram N/A 03/28/2014    Procedure: ABDOMINAL AORTAGRAM;  Surgeon: Rosetta Posner, MD;  Location: [facility] CATH LAB;  Service: Cardiovascular;  Laterality: N/A;    REVIEW OF  SYSTEMS:  A comprehensive review of systems was negative.   PHYSICAL EXAMINATION: General appearance: alert, cooperative and no distress Head: Normocephalic, without obvious abnormality, atraumatic Neck: no adenopathy, no JVD, supple, symmetrical, trachea midline and thyroid not enlarged, symmetric, no tenderness/mass/nodules Lymph nodes: Cervical, supraclavicular, and axillary nodes normal. Resp: clear to auscultation bilaterally Back: symmetric, no curvature. ROM normal. No CVA tenderness. Cardio: regular rate and rhythm, S1, S2 normal, no murmur, click, rub or gallop GI: soft, non-tender; bowel sounds normal; no masses,  no organomegaly Extremities: extremities normal, atraumatic, no cyanosis or edema Neurologic: Alert and oriented X 3, normal strength and tone. Normal symmetric reflexes. Normal  coordination and gait  ECOG PERFORMANCE STATUS: 0 - Asymptomatic  Blood pressure 107/57, pulse 71, temperature 97.9 F (36.6 C), temperature source Oral, resp. rate 18, height _0  (1.651 m), weight 143 lb (64.864 kg), SpO2 100 %.  LABORATORY DATA: Lab Results  Component Value Date   WBC 3.8* 12/05/2014   HGB 10.1* 12/05/2014   HCT 30.9* 12/05/2014   MCV 109.4* 12/05/2014   PLT 225 12/05/2014      Chemistry      Component Value Date/Time   NA 144 12/05/2014 1457   NA 143 05/03/2014 0351   K 3.8 12/05/2014 1457   K 4.2 05/03/2014 0351   CL 105 05/03/2014 0351   CO2 28 12/05/2014 1457   CO2 29 05/03/2014 0351   BUN 13.5 12/05/2014 1457   BUN 13 05/03/2014 0351   CREATININE 0.9 12/05/2014 1457   CREATININE 0.89 05/03/2014 0351      Component Value Date/Time   CALCIUM 9.5 12/05/2014 1457   CALCIUM 8.8 05/03/2014 0351   ALKPHOS 62 12/05/2014 1457   ALKPHOS 77 04/19/2014 0947   AST 20 12/05/2014 1457   AST 18 04/19/2014 0947   ALT 19 12/05/2014 1457   ALT 16 04/19/2014 0947   BILITOT 0.31 12/05/2014 1457   BILITOT <0.2* 04/19/2014 0947     Other lab results: Beta-2 microglobulin 4.53, free kappa light chain 0.20, free lambda light chain 10.70 with a kappa/lambda ratio 0.02. IgG 89, IgA 1200 and IgM less than 5.  RADIOGRAPHIC STUDIES:  ASSESSMENT AND PLAN: This is a very pleasant 81 years old white male recently diagnosed with multiple myeloma but currently asymptomatic with no lytic lesions or declined of his renal function. He has mild anemia. His myeloma panel today showed stable disease I discussed the lab result with the patient and his family. I recommended for him to continue on observation with repeat myeloma panel in 6 months. He was advised to call immediately if he has any concerning symptoms in the interval. The patient voices understanding of current disease status and treatment options and is in agreement with the current care plan.  All questions were  answered. The patient knows to call the clinic with any problems, questions or concerns. We can certainly see the patient much sooner if necessary.  Disclaimer: This note was dictated with voice recognition software. Similar sounding words can inadvertently be transcribed and may not be corrected upon review.

## 2014-12-27 NOTE — Telephone Encounter (Signed)
Gave avs & calendar for September °

## 2015-03-22 ENCOUNTER — Other Ambulatory Visit: Payer: Self-pay | Admitting: Geriatric Medicine

## 2015-03-22 DIAGNOSIS — H34219 Partial retinal artery occlusion, unspecified eye: Secondary | ICD-10-CM

## 2015-03-26 ENCOUNTER — Other Ambulatory Visit (HOSPITAL_COMMUNITY): Payer: PRIVATE HEALTH INSURANCE

## 2015-03-27 ENCOUNTER — Ambulatory Visit
Admission: RE | Admit: 2015-03-27 | Discharge: 2015-03-27 | Disposition: A | Discharge status: Home / Self Care | Payer: PPO | Source: Ambulatory Visit | Attending: Geriatric Medicine | Admitting: Geriatric Medicine

## 2015-03-27 DIAGNOSIS — H34219 Partial retinal artery occlusion, unspecified eye: Secondary | ICD-10-CM

## 2015-03-28 ENCOUNTER — Other Ambulatory Visit (HOSPITAL_COMMUNITY): Payer: Self-pay | Admitting: Geriatric Medicine

## 2015-03-28 ENCOUNTER — Ambulatory Visit (HOSPITAL_COMMUNITY): Payer: PPO | Attending: Cardiology

## 2015-03-28 ENCOUNTER — Other Ambulatory Visit (HOSPITAL_COMMUNITY): Payer: PRIVATE HEALTH INSURANCE

## 2015-03-28 ENCOUNTER — Other Ambulatory Visit: Payer: Self-pay

## 2015-03-28 DIAGNOSIS — I340 Nonrheumatic mitral (valve) insufficiency: Secondary | ICD-10-CM | POA: Insufficient documentation

## 2015-03-28 DIAGNOSIS — I517 Cardiomegaly: Secondary | ICD-10-CM | POA: Diagnosis not present

## 2015-03-28 DIAGNOSIS — H3589 Other specified retinal disorders: Secondary | ICD-10-CM | POA: Diagnosis not present

## 2015-03-28 DIAGNOSIS — H34219 Partial retinal artery occlusion, unspecified eye: Secondary | ICD-10-CM | POA: Diagnosis present

## 2015-03-28 DIAGNOSIS — I358 Other nonrheumatic aortic valve disorders: Secondary | ICD-10-CM | POA: Diagnosis not present

## 2015-05-23 ENCOUNTER — Encounter
Admission: RE | Admit: 2015-05-23 | Discharge: 2015-05-23 | Disposition: A | Discharge status: Home / Self Care | Payer: PPO | Source: Ambulatory Visit | Attending: Unknown Physician Specialty | Admitting: Unknown Physician Specialty

## 2015-05-23 DIAGNOSIS — Z01812 Encounter for preprocedural laboratory examination: Secondary | ICD-10-CM | POA: Insufficient documentation

## 2015-05-23 DIAGNOSIS — Z01810 Encounter for preprocedural cardiovascular examination: Secondary | ICD-10-CM | POA: Insufficient documentation

## 2015-05-23 HISTORY — DX: Restless legs syndrome: G25.81

## 2015-05-23 HISTORY — DX: Unspecified osteoarthritis, unspecified site: M19.90

## 2015-05-23 HISTORY — DX: Reserved for inherently not codable concepts without codable children: IMO0001

## 2015-05-23 LAB — CBC
HCT: 28.8 % — ABNORMAL LOW (ref 40.0–52.0)
HEMOGLOBIN: 9.7 g/dL — AB (ref 13.0–18.0)
MCH: 36.4 pg — ABNORMAL HIGH (ref 26.0–34.0)
MCHC: 33.8 g/dL (ref 32.0–36.0)
MCV: 107.8 fL — AB (ref 80.0–100.0)
Platelets: 167 10*3/uL (ref 150–440)
RBC: 2.67 MIL/uL — ABNORMAL LOW (ref 4.40–5.90)
RDW: 15.6 % — ABNORMAL HIGH (ref 11.5–14.5)
WBC: 3.4 10*3/uL — AB (ref 3.8–10.6)

## 2015-05-23 LAB — POTASSIUM: Potassium: 3.9 mmol/L (ref 3.5–5.1)

## 2015-05-23 NOTE — Patient Instructions (Signed)
  Your procedure is scheduled PV:[id number] 30, 2016 (Tuesday) Report to Day Surgery.[facility]) To find out your arrival time please call [phone] between 1PM - 3PM on May 27, 2015 (Monday).  Remember: Instructions that are not followed completely may result in serious medical risk, up to and including death, or upon the discretion of your surgeon and anesthesiologist your surgery may need to be rescheduled.    __x__ 1. Do not eat food or drink liquids after midnight. No gum chewing or hard candies.     ____ 2. No Alcohol for 24 hours before or after surgery.   ____ 3. Bring all medications with you on the day of surgery if instructed.    __x__ 4. Notify your doctor if there is any change in your medical condition     (cold, fever, infections).     Do not wear jewelry, make-up, hairpins, clips or nail polish.  Do not wear lotions, powders, or perfumes. You may wear deodorant.  Do not shave 48 hours prior to surgery. Men may shave face and neck.  Do not bring valuables to the hospital.    [facility] is not responsible for any belongings or valuables.               Contacts, dentures or bridgework may not be worn into surgery.  Leave your suitcase in the car. After surgery it may be brought to your room.  For patients admitted to the hospital, discharge time is determined by your                treatment team.   Patients discharged the day of surgery will not be allowed to drive home.   Please read over the following fact sheets that you were given:      __x__ Take these medicines the morning of surgery with A SIP OF WATER:    1. Lisinopril  2. Nexium  3.   4.  5.  6.  ____ Fleet Enema (as directed)   ____ Use CHG Soap as directed  ____ Use inhalers on the day of surgery  ____ Stop metformin 2 days prior to surgery    ____ Take 1/2 of usual insulin dose the night before surgery and none on the morning of surgery.   __x__ Stop Coumadin/Plavix/aspirin on (PT.  HAS STOPPED ASPIRIN  ON 8/24)  ____ Stop Anti-inflammatories on    ____ Stop supplements until after surgery.    ____ Bring C-Pap to the hospital.   ___   __

## 2015-05-27 MED ORDER — PHENYLEPHRINE HCL 10 % OP SOLN: Freq: Once | OPHTHALMIC | Status: DC

## 2015-05-28 ENCOUNTER — Ambulatory Visit
Admission: RE | Admit: 2015-05-28 | Discharge: 2015-05-28 | Disposition: A | Discharge status: Home / Self Care | Payer: PPO | Source: Ambulatory Visit | Attending: Unknown Physician Specialty | Admitting: Unknown Physician Specialty

## 2015-05-28 ENCOUNTER — Encounter: Payer: Self-pay | Admitting: *Deleted

## 2015-05-28 ENCOUNTER — Ambulatory Visit: Payer: PPO | Admitting: Certified Registered"

## 2015-05-28 ENCOUNTER — Encounter
Admission: RE | Disposition: A | Discharge status: Home / Self Care | Payer: Self-pay | Source: Ambulatory Visit | Attending: Unknown Physician Specialty

## 2015-05-28 DIAGNOSIS — H409 Unspecified glaucoma: Secondary | ICD-10-CM | POA: Insufficient documentation

## 2015-05-28 DIAGNOSIS — D020 Carcinoma in situ of larynx: Secondary | ICD-10-CM | POA: Diagnosis not present

## 2015-05-28 DIAGNOSIS — Z85830 Personal history of malignant neoplasm of bone: Secondary | ICD-10-CM | POA: Insufficient documentation

## 2015-05-28 DIAGNOSIS — I739 Peripheral vascular disease, unspecified: Secondary | ICD-10-CM | POA: Diagnosis not present

## 2015-05-28 DIAGNOSIS — R490 Dysphonia: Secondary | ICD-10-CM | POA: Diagnosis not present

## 2015-05-28 DIAGNOSIS — K219 Gastro-esophageal reflux disease without esophagitis: Secondary | ICD-10-CM | POA: Insufficient documentation

## 2015-05-28 DIAGNOSIS — D649 Anemia, unspecified: Secondary | ICD-10-CM | POA: Insufficient documentation

## 2015-05-28 DIAGNOSIS — Z85038 Personal history of other malignant neoplasm of large intestine: Secondary | ICD-10-CM | POA: Diagnosis not present

## 2015-05-28 DIAGNOSIS — M1990 Unspecified osteoarthritis, unspecified site: Secondary | ICD-10-CM | POA: Diagnosis not present

## 2015-05-28 DIAGNOSIS — I10 Essential (primary) hypertension: Secondary | ICD-10-CM | POA: Insufficient documentation

## 2015-05-28 DIAGNOSIS — D489 Neoplasm of uncertain behavior, unspecified: Secondary | ICD-10-CM | POA: Diagnosis present

## 2015-05-28 DIAGNOSIS — Z8249 Family history of ischemic heart disease and other diseases of the circulatory system: Secondary | ICD-10-CM | POA: Insufficient documentation

## 2015-05-28 DIAGNOSIS — I2510 Atherosclerotic heart disease of native coronary artery without angina pectoris: Secondary | ICD-10-CM | POA: Insufficient documentation

## 2015-05-28 DIAGNOSIS — Z79899 Other long term (current) drug therapy: Secondary | ICD-10-CM | POA: Diagnosis not present

## 2015-05-28 DIAGNOSIS — Z87891 Personal history of nicotine dependence: Secondary | ICD-10-CM | POA: Diagnosis not present

## 2015-05-28 DIAGNOSIS — R0602 Shortness of breath: Secondary | ICD-10-CM | POA: Diagnosis not present

## 2015-05-28 HISTORY — PX: MICROLARYNGOSCOPY: SHX5208

## 2015-05-28 SURGERY — MICROLARYNGOSCOPY
Anesthesia: General | Site: Neck | Wound class: Clean Contaminated

## 2015-05-28 MED ORDER — LACTATED RINGERS IV SOLN
INTRAVENOUS | Status: DC
  Administered 2015-05-28: 07:00:00 via INTRAVENOUS

## 2015-05-28 MED ORDER — FENTANYL CITRATE (PF) 100 MCG/2ML IJ SOLN
INTRAMUSCULAR | Status: DC | PRN
  Administered 2015-05-28 (×3): 50 ug via INTRAVENOUS

## 2015-05-28 MED ORDER — METHYLPREDNISOLONE SODIUM SUCC 125 MG IJ SOLR
INTRAMUSCULAR | Status: DC | PRN
  Administered 2015-05-28: 60 mg via INTRAVENOUS

## 2015-05-28 MED ORDER — ONDANSETRON HCL 4 MG/2ML IJ SOLN
INTRAMUSCULAR | Status: DC | PRN
  Administered 2015-05-28: 4 mg via INTRAVENOUS

## 2015-05-28 MED ORDER — LIDOCAINE-EPINEPHRINE 1 %-1:100000 IJ SOLN
INTRAMUSCULAR | Status: AC
  Filled 2015-05-28: qty 1

## 2015-05-28 MED ORDER — PHENYLEPHRINE HCL 10 % OP SOLN
OPHTHALMIC | Status: DC | PRN
  Administered 2015-05-28: 1 mL via TOPICAL

## 2015-05-28 MED ORDER — HYDROCODONE-ACETAMINOPHEN 5-300 MG PO TABS: 1.0000 | ORAL_TABLET | ORAL | Status: DC | PRN

## 2015-05-28 MED ORDER — LIDOCAINE HCL (CARDIAC) 20 MG/ML IV SOLN
INTRAVENOUS | Status: DC | PRN
  Administered 2015-05-28: 50 mg via INTRAVENOUS

## 2015-05-28 MED ORDER — NEOSTIGMINE METHYLSULFATE 10 MG/10ML IV SOLN
INTRAVENOUS | Status: DC | PRN
  Administered 2015-05-28: 2.5 mg via INTRAVENOUS

## 2015-05-28 MED ORDER — FENTANYL CITRATE (PF) 100 MCG/2ML IJ SOLN: 25.0000 ug | INTRAMUSCULAR | Status: DC | PRN

## 2015-05-28 MED ORDER — PROPOFOL 10 MG/ML IV BOLUS
INTRAVENOUS | Status: DC | PRN
  Administered 2015-05-28: 130 mg via INTRAVENOUS

## 2015-05-28 MED ORDER — SUCCINYLCHOLINE CHLORIDE 20 MG/ML IJ SOLN
INTRAMUSCULAR | Status: DC | PRN
  Administered 2015-05-28: 100 mg via INTRAVENOUS

## 2015-05-28 MED ORDER — GLYCOPYRROLATE 0.2 MG/ML IJ SOLN
INTRAMUSCULAR | Status: DC | PRN
  Administered 2015-05-28: .5 mg via INTRAVENOUS

## 2015-05-28 MED ORDER — ONDANSETRON HCL 4 MG/2ML IJ SOLN: 4.0000 mg | Freq: Once | INTRAMUSCULAR | Status: DC | PRN

## 2015-05-28 MED ORDER — DEXAMETHASONE SODIUM PHOSPHATE 4 MG/ML IJ SOLN
INTRAMUSCULAR | Status: DC | PRN
  Administered 2015-05-28: 10 mg via INTRAVENOUS

## 2015-05-28 MED ORDER — ROCURONIUM BROMIDE 100 MG/10ML IV SOLN
INTRAVENOUS | Status: DC | PRN
  Administered 2015-05-28: 10 mg via INTRAVENOUS

## 2015-05-28 MED ORDER — PHENYLEPHRINE HCL 10 % OP SOLN
Freq: Once | OPHTHALMIC | Status: DC
  Filled 2015-05-28: qty 10

## 2015-05-28 SURGICAL SUPPLY — 16 items
CANISTER SUCT 1200ML W/VALVE (MISCELLANEOUS) ×3 IMPLANT
CUP MEDICINE 2OZ PLAST GRAD ST (MISCELLANEOUS) ×3 IMPLANT
DRAPE TABLE BACK 80X90 (DRAPES) ×3 IMPLANT
DRESSING TELFA 4X3 1S ST N-ADH (GAUZE/BANDAGES/DRESSINGS) ×3 IMPLANT
GLOVE BIO SURGEON STRL SZ7.5 (GLOVE) ×9 IMPLANT
GOWN STRL REUS W/ TWL LRG LVL3 (GOWN DISPOSABLE) ×1 IMPLANT
GOWN STRL REUS W/TWL LRG LVL3 (GOWN DISPOSABLE) ×2
NDL SAFETY 18GX1.5 (NEEDLE) ×3 IMPLANT
NEEDLE HYPO 25GX1X1/2 BEV (NEEDLE) ×3 IMPLANT
PATTIES SURGICAL .5 X.5 (GAUZE/BANDAGES/DRESSINGS) ×3 IMPLANT
SOL ANTI-FOG 6CC FOG-OUT (MISCELLANEOUS) ×1 IMPLANT
SOL FOG-OUT ANTI-FOG 6CC (MISCELLANEOUS) ×2
SPONGE XRAY 4X4 16PLY STRL (MISCELLANEOUS) ×3 IMPLANT
SYRINGE 10CC LL (SYRINGE) ×3 IMPLANT
TUBING CONNECTING 10 (TUBING) ×2 IMPLANT
TUBING CONNECTING 10' (TUBING) ×1

## 2015-05-28 NOTE — Transfer of Care (Signed)
Immediate Anesthesia Transfer of Care Note  Patient: Eric [name]  Procedure(s) Performed: Procedure(s): MICROLARYNGOSCOPY/ WITH BIOPSY (N/A)  Patient Location: PACU  Anesthesia Type:General  Level of Consciousness: awake  Airway & Oxygen Therapy: Patient Spontanous Breathing and Patient connected to face mask oxygen  Post-op Assessment: Report given to RN  Post vital signs: Reviewed  Last Vitals:  Filed Vitals:   05/28/15 0805  BP: 166/75  Pulse: 71  Temp: 35.7 C  Resp: 13    Complications: No apparent anesthesia complications

## 2015-05-28 NOTE — H&P (Signed)
  H+P  Reviewed and will be scanned in later. No changes noted. 

## 2015-05-28 NOTE — Anesthesia Procedure Notes (Signed)
Procedure Name: Intubation Date/Time: 05/28/2015 7:33 AM Performed by: Rolla Plate Pre-anesthesia Checklist: Patient identified, Patient being monitored, Timeout performed, Emergency Drugs available and Suction available Patient Re-evaluated:Patient Re-evaluated prior to inductionOxygen Delivery Method: Circle system utilized Preoxygenation: Pre-oxygenation with 100% oxygen Intubation Type: IV induction Ventilation: Mask ventilation without difficulty Laryngoscope Size: 2 and Miller Grade View: Grade II Tube type: MLT Tube size: 6.0 mm Number of attempts: 1 Airway Equipment and Method: Stylet Placement Confirmation: ETT inserted through vocal cords under direct vision,  positive ETCO2 and breath sounds checked- equal and bilateral Secured at: 22 cm Tube secured with: Tape Dental Injury: Teeth and Oropharynx as per pre-operative assessment

## 2015-05-28 NOTE — Anesthesia Postprocedure Evaluation (Signed)
  Anesthesia Post-op Note  Patient: Eric [name]  Procedure(s) Performed: Procedure(s): MICROLARYNGOSCOPY/ WITH BIOPSY (N/A)  Anesthesia type:General  Patient location: PACU  Post pain: Pain level controlled  Post assessment: Post-op Vital signs reviewed, Patient's Cardiovascular Status Stable, Respiratory Function Stable, Patent Airway and No signs of Nausea or vomiting  Post vital signs: Reviewed and stable  Last Vitals:  Filed Vitals:   05/28/15 0843  BP: 155/65  Pulse: 95  Temp: 36.7 C  Resp: 16    Level of consciousness: awake, alert  and patient cooperative  Complications: No apparent anesthesia complications

## 2015-05-28 NOTE — Discharge Instructions (Addendum)
AMBULATORY SURGERY  DISCHARGE INSTRUCTIONS   1) The drugs that you were given will stay in your system until tomorrow so for the next 24 hours you should not:  A) Drive an automobile B) Make any legal decisions C) Drink any alcoholic beverage   2) You may resume regular meals tomorrow.  Today it is better to start with liquids and gradually work up to solid foods.  You may eat anything you prefer, but it is better to start with liquids, then soup and crackers, and gradually work up to solid foods.   3) Please notify your doctor immediately if you have any unusual bleeding, trouble breathing, redness and pain at the surgery site, drainage, fever, or pain not relieved by medi                                     4) Additional Instructions:

## 2015-05-28 NOTE — Anesthesia Preprocedure Evaluation (Addendum)
Anesthesia Evaluation  Patient identified by MRN, date of birth, ID band Patient awake    Reviewed: Allergy & Precautions, NPO status , Patient's Chart, lab work & pertinent test results  Airway Mallampati: II  TM Distance: >3 FB Neck ROM: Full    Dental no notable dental hx.    Pulmonary shortness of breath and with exertion, pneumonia -, resolved, former smoker,    Pulmonary exam normal       Cardiovascular hypertension, + CAD and + Peripheral Vascular Disease Normal cardiovascular exam    Neuro/Psych PSYCHIATRIC DISORDERS CVA, Residual Symptoms    GI/Hepatic Neg liver ROS, GERD-  Medicated and Controlled,  Endo/Other  negative endocrine ROS  Renal/GU negative Renal ROS  negative genitourinary   Musculoskeletal  (+) Arthritis -, Osteoarthritis,    Abdominal Normal abdominal exam  (+)   Peds negative pediatric ROS (+)  Hematology  (+) anemia ,   Anesthesia Other Findings   Reproductive/Obstetrics                            Anesthesia Physical Anesthesia Plan  ASA: III  Anesthesia Plan: General   Post-op Pain Management:    Induction: Intravenous  Airway Management Planned: Oral ETT  Additional Equipment:   Intra-op Plan:   Post-operative Plan: Extubation in OR  Informed Consent: I have reviewed the patients History and Physical, chart, labs and discussed the procedure including the risks, benefits and alternatives for the proposed anesthesia with the patient or authorized representative who has indicated his/her understanding and acceptance.   Dental advisory given  Plan Discussed with: CRNA and Surgeon  Anesthesia Plan Comments:         Anesthesia Quick Evaluation

## 2015-05-28 NOTE — Op Note (Signed)
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 After general Endotracheal anesthesia the table was turned 90. A tooth guard was placed a Dedo laryngoscope was introduced into the airway and suspended. Examination of the larynx showed areas of roughened exophytic mucosa of the anterior vocal folds bilaterally worse on the left than the right. A 0 Hopkins rod was placed in the airway and photographs were taken for documentation. A cottonoid pledget with phenylephrine lidocaine solution was used to bathe the larynx. The operating my prescription was then brought into the field using the Shapshay microlaryngeal instruments multiple biopsies were taken of the left anterior vocal fold and 2-3 biopsies were taken of the right anterior vocal fold. Care was taken not to involve the anterior commissure. With biopsies taken the common pledget again was used to bathe the larynx this completed and minimal bleeding the patient was a return anesthesia was explained the operating room taken recovery in stable condition cultures none specimens bilateral vocal cord biopsies  Dispo:   Good  Plan:  Plan to discharge to home for follow-up in 3 weeks  [name]  05/28/2015 7:53 AM

## 2015-05-29 LAB — SURGICAL PATHOLOGY

## 2015-06-05 ENCOUNTER — Encounter: Payer: Self-pay | Admitting: Oncology

## 2015-06-05 ENCOUNTER — Inpatient Hospital Stay: Payer: PPO | Attending: Oncology | Admitting: Oncology

## 2015-06-05 ENCOUNTER — Telehealth: Payer: Self-pay | Admitting: *Deleted

## 2015-06-05 ENCOUNTER — Ambulatory Visit: Payer: PRIVATE HEALTH INSURANCE

## 2015-06-05 VITALS — BP 147/66 | HR 61 | Temp 94.7°F | Wt 140.0 lb

## 2015-06-05 DIAGNOSIS — Z85828 Personal history of other malignant neoplasm of skin: Secondary | ICD-10-CM | POA: Diagnosis not present

## 2015-06-05 DIAGNOSIS — Z87891 Personal history of nicotine dependence: Secondary | ICD-10-CM | POA: Insufficient documentation

## 2015-06-05 DIAGNOSIS — F17220 Nicotine dependence, chewing tobacco, uncomplicated: Secondary | ICD-10-CM | POA: Insufficient documentation

## 2015-06-05 DIAGNOSIS — Z85038 Personal history of other malignant neoplasm of large intestine: Secondary | ICD-10-CM | POA: Diagnosis not present

## 2015-06-05 DIAGNOSIS — G2581 Restless legs syndrome: Secondary | ICD-10-CM | POA: Insufficient documentation

## 2015-06-05 DIAGNOSIS — I2510 Atherosclerotic heart disease of native coronary artery without angina pectoris: Secondary | ICD-10-CM | POA: Diagnosis not present

## 2015-06-05 DIAGNOSIS — I10 Essential (primary) hypertension: Secondary | ICD-10-CM

## 2015-06-05 DIAGNOSIS — F0390 Unspecified dementia without behavioral disturbance: Secondary | ICD-10-CM | POA: Diagnosis not present

## 2015-06-05 DIAGNOSIS — Z8701 Personal history of pneumonia (recurrent): Secondary | ICD-10-CM | POA: Diagnosis not present

## 2015-06-05 DIAGNOSIS — H9190 Unspecified hearing loss, unspecified ear: Secondary | ICD-10-CM | POA: Diagnosis not present

## 2015-06-05 DIAGNOSIS — Z7982 Long term (current) use of aspirin: Secondary | ICD-10-CM | POA: Insufficient documentation

## 2015-06-05 DIAGNOSIS — I739 Peripheral vascular disease, unspecified: Secondary | ICD-10-CM | POA: Diagnosis not present

## 2015-06-05 DIAGNOSIS — Z8673 Personal history of transient ischemic attack (TIA), and cerebral infarction without residual deficits: Secondary | ICD-10-CM | POA: Insufficient documentation

## 2015-06-05 DIAGNOSIS — D649 Anemia, unspecified: Secondary | ICD-10-CM | POA: Diagnosis not present

## 2015-06-05 DIAGNOSIS — Z87442 Personal history of urinary calculi: Secondary | ICD-10-CM | POA: Insufficient documentation

## 2015-06-05 DIAGNOSIS — K219 Gastro-esophageal reflux disease without esophagitis: Secondary | ICD-10-CM | POA: Diagnosis not present

## 2015-06-05 DIAGNOSIS — E780 Pure hypercholesterolemia: Secondary | ICD-10-CM | POA: Insufficient documentation

## 2015-06-05 DIAGNOSIS — M818 Other osteoporosis without current pathological fracture: Secondary | ICD-10-CM

## 2015-06-05 DIAGNOSIS — Z809 Family history of malignant neoplasm, unspecified: Secondary | ICD-10-CM | POA: Insufficient documentation

## 2015-06-05 DIAGNOSIS — C9000 Multiple myeloma not having achieved remission: Secondary | ICD-10-CM | POA: Insufficient documentation

## 2015-06-05 DIAGNOSIS — Z79899 Other long term (current) drug therapy: Secondary | ICD-10-CM | POA: Diagnosis not present

## 2015-06-05 DIAGNOSIS — C329 Malignant neoplasm of larynx, unspecified: Secondary | ICD-10-CM | POA: Diagnosis present

## 2015-06-05 DIAGNOSIS — R0609 Other forms of dyspnea: Secondary | ICD-10-CM | POA: Insufficient documentation

## 2015-06-05 DIAGNOSIS — C320 Malignant neoplasm of glottis: Secondary | ICD-10-CM

## 2015-06-05 HISTORY — DX: Malignant neoplasm of larynx, unspecified: C32.9

## 2015-06-05 LAB — CBC WITH DIFFERENTIAL/PLATELET
BASOS ABS: 0 10*3/uL (ref 0–0.1)
BASOS PCT: 1 %
EOS ABS: 0.1 10*3/uL (ref 0–0.7)
EOS PCT: 3 %
HCT: 30.6 % — ABNORMAL LOW (ref 40.0–52.0)
Hemoglobin: 10.5 g/dL — ABNORMAL LOW (ref 13.0–18.0)
LYMPHS PCT: 39 %
Lymphs Abs: 1.5 10*3/uL (ref 1.0–3.6)
MCH: 36.9 pg — ABNORMAL HIGH (ref 26.0–34.0)
MCHC: 34.2 g/dL (ref 32.0–36.0)
MCV: 108 fL — AB (ref 80.0–100.0)
MONO ABS: 0.4 10*3/uL (ref 0.2–1.0)
Monocytes Relative: 10 %
Neutro Abs: 1.9 10*3/uL (ref 1.4–6.5)
Neutrophils Relative %: 47 %
PLATELETS: 234 10*3/uL (ref 150–440)
RBC: 2.83 MIL/uL — AB (ref 4.40–5.90)
RDW: 16 % — AB (ref 11.5–14.5)
WBC: 3.9 10*3/uL (ref 3.8–10.6)

## 2015-06-05 LAB — COMPREHENSIVE METABOLIC PANEL
ALBUMIN: 3.4 g/dL — AB (ref 3.5–5.0)
ALT: 14 U/L — AB (ref 17–63)
AST: 15 U/L (ref 15–41)
Alkaline Phosphatase: 67 U/L (ref 38–126)
Anion gap: 7 (ref 5–15)
BUN: 19 mg/dL (ref 6–20)
CHLORIDE: 100 mmol/L — AB (ref 101–111)
CO2: 31 mmol/L (ref 22–32)
CREATININE: 0.98 mg/dL (ref 0.61–1.24)
Calcium: 9.3 mg/dL (ref 8.9–10.3)
GFR calc Af Amer: >60 mL/min (ref >60)
GLUCOSE: 104 mg/dL — AB (ref 65–99)
POTASSIUM: 3.9 mmol/L (ref 3.5–5.1)
SODIUM: 138 mmol/L (ref 135–145)
Total Bilirubin: 0.5 mg/dL (ref 0.3–1.2)
Total Protein: 7.9 g/dL (ref 6.5–8.1)

## 2015-06-05 LAB — FERRITIN: FERRITIN: 45 ng/mL (ref 24–336)

## 2015-06-05 LAB — IRON AND TIBC
Iron: 82 ug/dL (ref 45–182)
SATURATION RATIOS: 23 % (ref 17.9–39.5)
TIBC: 351 ug/dL (ref 250–450)
UIBC: 269 ug/dL

## 2015-06-05 NOTE — Telephone Encounter (Signed)
Patient recently informed that he has throat cancer. Patient is seeing MD [facility] for MM. Patient wife called stating that she would like for MD [facility] to take over full care for patient. Please advise. Message sent to MD Lavonna Rua (current provider at [facility])/ MD Julien Nordmann (MM provider)

## 2015-06-05 NOTE — Telephone Encounter (Signed)
I will be happy to take care of him if ok with Dr. Jeb Levering.

## 2015-06-05 NOTE — Progress Notes (Signed)
Patient does have living will.  Former smoker.  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  Referred by Dr. Tami Ribas.  Patient states he has no pain except for throat feeling a little sore.  Patient has history of bone and colon ca. Dx - Multiple Myeloma - sees Dr. Rogue Jury in [facility].

## 2015-06-05 NOTE — Progress Notes (Signed)
[place] @ [facility] Telephone:(336) [phone]  Fax:(336) [place] OB: 22-Aug-1930  MR#: [id number]  PJA#:[id number]  Patient Care Team: Lajean Manes, MD as PCP - General (Internal Medicine) Curt Bears, MD as Consulting Physician (Oncology) Beverly Gust, MD (Unknown Physician Specialty)  CHIEF COMPLAINT:  Chief Complaint  Patient presents with  . New Evaluation    VISIT DIAGNOSIS:     ICD-9-CM ICD-10-CM   1. 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 Patient started having hoarseness of voice over  Last 3 weeks.  No difficulty in swallowing.  Patient underwent initial evaluation with fiberoptic laryngoscopy by Dr. Tami Ribas and was found to have hemorrhagic polypoid mass of the left vocal cord.  Biopsy was positive for squamous cell carcinoma.  Right vocal cord biopsy was dysplasia.  Patient was referred to me for further evaluation Patient has significant peripheral vascular disease peripheral stent placement.  Has a history of smoldering multiple myeloma being followed by oncologist in [place] Denies any significant weight loss   REVIEW OF SYSTEMS:   Gen.  status: Patient does not have any significant weight loss fever HEENT: Started having hoarseness of voice over  3-4 weeks.  No difficulty swallowing.  Ace and is hard of hearing and using hearing aid Lungs: No cough or shortness of breath GI: No nausea no vomiting no diarrhea Cardiac: No chest pain Musculoskeletal system no bone pains.  Patient carries diagnoses of smoldering multiple myeloma being followed by oncologist at [facility].  Was evaluated in March and review of the notes shows no significant change in myeloma protein Skin: No rash Neurological system: No headache no dizziness Lower extremity no swelling     As per HPI. Otherwise, a complete review of systems is negatve.  PAST MEDICAL HISTORY: Past Medical History  Diagnosis Date  . Peripheral vascular disease   . Transient global amnesia   . Hypercholesterolemia   . Mesenteric thrombosis     post colon resection  . Hypertension   . Macrocytic anemia   . Glaucoma   . Osteoporosis   . GERD (gastroesophageal reflux disease)   . Cholelithiasis   . Shingles 2009  . Pneumonia 02-2011    left lower lobe  . CAD (coronary artery disease)   . Colon cancer     Multiple Mylemona  . Stroke     'Mini Stroke"  . Dementia   . Skin cancer     ears  . Peripheral arterial disease   . Shortness of breath dyspnea     with exertion  . Arthritis   . Restless leg syndrome   . Squamous cell  carcinoma of larynx 06/05/2015    PAST SURGICAL HISTORY: Past Surgical History  Procedure Laterality Date  . Laminectomies      multiple  . Exploratory laparotomy      treat twisted intestines  . Colectomy      to remove adenoma  . Femoral artery stenting    . Back surgery      lower back  . Endarterectomy femoral Left 05/02/2014    Procedure: LEFT FEMORAL ENDARTERECTOMY ;  Surgeon: Rosetta Posner, MD;  Location: [place];  Service: Vascular;  Laterality: Left;  . Abdominal aortagram N/A 03/28/2014    Procedure: ABDOMINAL AORTAGRAM;   Surgeon: Rosetta Posner, MD;  Location: [facility] CATH LAB;  Service: Cardiovascular;  Laterality: N/A;  . Eye surgery Left     cataract extraction  . Microlaryngoscopy N/A 05/28/2015    Procedure: MICROLARYNGOSCOPY/ WITH BIOPSY;  Surgeon: Beverly Gust, MD;  Location: [facility] ORS;  Service: ENT;  Laterality: N/A;    FAMILY HISTORY Family History  Problem Relation Age of Onset  . Diabetes Mother   . Heart disease Mother   . Hypertension Mother   . Heart disease Father   . Hypertension Father   . Diabetes Father   . Hyperlipidemia Father   . Peripheral vascular disease Father   . Cancer Brother   . Diabetes Brother   . Hypertension Brother   . Heart disease Brother     before age 54  . Cancer Sister         ADVANCED DIRECTIVES:  Patient does have advance healthcare directive, Patient   does not desire to make any changes  HEALTH MAINTENANCE: Social History  Substance Use Topics  . Smoking status: Former Smoker    Types: Cigarettes    Quit date: 09/28/1988  . Smokeless tobacco: Current User    Types: Chew  . Alcohol Use: No     Comment: rare    :  No Known Allergies  Current Outpatient Prescriptions  Medication Sig Dispense Refill  . aspirin EC 81 MG tablet Take 81 mg by mouth daily.    . clonazePAM (KLONOPIN) 0.5 MG tablet Take 0.5 mg by mouth at bedtime.     Marland Kitchen esomeprazole (NEXIUM) 40 MG capsule Take 20 mg by mouth at bedtime.     . hydrochlorothiazide (HYDRODIURIL) 25 MG tablet Take 25 mg by mouth daily.    Marland Kitchen latanoprost (XALATAN) 0.005 % ophthalmic solution Place 1 drop into both eyes at bedtime.     Marland Kitchen lisinopril (PRINIVIL,ZESTRIL) 5 MG tablet Take 5 mg by mouth daily.    . simvastatin (ZOCOR) 20 MG tablet Take 20 mg by mouth at bedtime.     . Timolol Maleate PF 0.5 % SOLN Apply 1 drop to eye daily.    . traMADol (ULTRAM) 50 MG tablet Take 1 tablet (50 mg total) by mouth every 6 (six) hours as needed. 20 tablet 0  . Hydrocodone-Acetaminophen 5-300 MG TABS Take 1-2  tablets by mouth every 4 (four) hours as needed. (Patient not taking: Reported on 06/05/2015) 30 each 0   No current facility-administered medications for this visit.    OBJECTIVE: PHYSICAL EXAM: \General status: Patient is alert and oriented not any acute distress HEENT: Patient does not have any significant abnormality detected on examination. Lungs: Emphysematous chest.  At entry diminished on both sides. Cardiac: Soft systolic murmur Heart sounds are normal Abdominal exam revealed normal bowel sounds. The abdomen was soft, non-tender, and without masses, organomegaly, or appreciable enlargement  of the abdominal aorta.  Patient had a previous multiple abdominal surgeries including surgery for colon adenoma versus cancer and previous history of intussusception Neurologically, the patient was awake, alert, and oriented to person, place and time. There were no obvious focal neurologic abnormalities. Examination of the skin revealed no evidence of significant rashes, suspicious appearing nevi or other concerning lesions. Lymphatic system: Supraclavicular, cervical, axillary, inguinal lymph nodes are not palpable Lower extremity no edema patient had previous history of peripheral vascular disease and stent placement  Filed Vitals:   06/05/15 1020  BP: 147/66  Pulse: 61  Temp: 94.7 F (34.8 C)     Body mass index is 22.61 kg/(m^2).    ECOG FS:1 - Symptomatic but completely ambulatory  LAB RESULTS:  Appointment on 06/05/2015  Component Date Value Ref Range Status  . WBC 06/05/2015 3.9  3.8 - 10.6 K/uL Final  . RBC 06/05/2015 2.83* 4.40 - 5.90 MIL/uL Final  . Hemoglobin 06/05/2015 10.5* 13.0 - 18.0 g/dL Final  . HCT 06/05/2015 30.6* 40.0 - 52.0 % Final  . MCV 06/05/2015 108.0* 80.0 - 100.0 fL Final  . MCH 06/05/2015 36.9* 26.0 - 34.0 pg Final  . MCHC 06/05/2015 34.2  32.0 - 36.0 g/dL Final  . RDW 06/05/2015 16.0* 11.5 - 14.5 % Final  . Platelets 06/05/2015 234  150 - 440 K/uL Final  .  Neutrophils Relative % 06/05/2015 47   Final  . Neutro Abs 06/05/2015 1.9  1.4 - 6.5 K/uL Final  . Lymphocytes Relative 06/05/2015 39   Final  . Lymphs Abs 06/05/2015 1.5  1.0 - 3.6 K/uL Final  . Monocytes Relative 06/05/2015 10   Final  . Monocytes Absolute 06/05/2015 0.4  0.2 - 1.0 K/uL Final  . Eosinophils Relative 06/05/2015 3   Final  . Eosinophils Absolute 06/05/2015 0.1  0 - 0.7 K/uL Final  . Basophils Relative 06/05/2015 1   Final  . Basophils Absolute 06/05/2015 0.0  0 - 0.1 K/uL Final  . Sodium 06/05/2015 138  135 - 145 mmol/L Final  . Potassium 06/05/2015 3.9  3.5 - 5.1 mmol/L Final  . Chloride 06/05/2015 100* 101 - 111 mmol/L Final  . CO2 06/05/2015 31  22 - 32 mmol/L Final  . Glucose, Bld 06/05/2015 104* 65 - 99 mg/dL Final  . BUN 06/05/2015 19  6 - 20 mg/dL Final  . Creatinine, Ser 06/05/2015 0.98  0.61 - 1.24 mg/dL Final  . Calcium 06/05/2015 9.3  8.9 - 10.3 mg/dL Final  . Total Protein 06/05/2015 7.9  6.5 - 8.1 g/dL Final  . Albumin 06/05/2015 3.4* 3.5 - 5.0 g/dL Final  . AST 06/05/2015 15  15 - 41 U/L Final  . ALT 06/05/2015 14* 17 - 63 U/L Final  . Alkaline Phosphatase 06/05/2015 67  38 - 126 U/L Final  . Total Bilirubin 06/05/2015 0.5  0.3 - 1.2 mg/dL Final  . GFR calc non Af Amer 06/05/2015 >60  >60 mL/min Final  . GFR calc Af Amer 06/05/2015 >60  >60 mL/min Final   Comment: (NOTE) The eGFR has been calculated using the CKD EPI equation. This calculation has not been validated in all clinical situations. eGFR's persistently <60 mL/min signify possible Chronic Kidney Disease.   . Anion gap 06/05/2015 7  5 - 15 Final  . Ferritin 06/05/2015 45  24 - 336 ng/mL Final  . Iron 06/05/2015 82  45 - 182 ug/dL Final  . TIBC 06/05/2015 351  250 - 450 ug/dL Final  .  Saturation Ratios 06/05/2015 23  17.9 - 39.5 % Final  . 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  Smoldering multiple myeloma presently not on any treatment reviewing the chart shows probably IgA multiple myeloma.  Being followed by an oncologist at [facility] 3.  Peripheral vascular disease 4.  Patient continues to use smokeless tobacco and chewing tobacco at been previous chronic smoker and counseling has been done  PLAN:   PET scan for further staging Discussed situation with the ENT surgeon patient is not a great candidate for surgical intervention in May respond well to radiation therapy.  If PET scan is showing stage I disease there is no role for addition of chemotherapy at present time Smoldering myeloma patient is not sure whether he wants to get continuing follow-up at [facility] or S tablets oncology care locally.  SIEP, beta-2 microglobulin and light chain has been ordered. Macrocytic anemia possibility of either involvement of bone marrow by multiple myeloma causing this problem versus other etiology cannot be ruled out. Iron studies are normal.  Vitamin B12 and folate acid studies are not available at present time Not sure whether bone marrow aspiration and biopsy were done in the past to not we will try to get some records from the [facility] oncology Department. After PET scan patient will be evaluated by me and radiation oncologist Cases: We discussed in tumor conference  Patient expressed understanding and was in agreement with this plan. He also understands that He can call clinic at any time with any questions, concerns, or complaints.    Multiple myeloma   Staging form: Multiple Myeloma, AJCC 6th Edition     Clinical: Stage IA - Signed by Curt Bears, MD on 04/04/2014 Squamous cell carcinoma of larynx   Staging form: Larynx - Supraglottis, AJCC 7th Edition     Clinical: Stage I (T1, N0, M0) - Signed by Forest Gleason, MD on 06/05/2015   Forest Gleason, MD   06/05/2015 8:53 PM

## 2015-06-06 ENCOUNTER — Telehealth: Payer: Self-pay | Admitting: *Deleted

## 2015-06-06 LAB — PROTEIN ELECTROPHORESIS, SERUM
A/G RATIO SPE: 0.8 (ref 0.7–1.7)
ALPHA-2-GLOBULIN: 0.7 g/dL (ref 0.4–1.0)
Albumin ELP: 3.4 g/dL (ref 2.9–4.4)
Alpha-1-Globulin: 0.3 g/dL (ref 0.0–0.4)
BETA GLOBULIN: 2.9 g/dL — AB (ref 0.7–1.3)
Gamma Globulin: 0.1 g/dL — ABNORMAL LOW (ref 0.4–1.8)
Globulin, Total: 4 g/dL — ABNORMAL HIGH (ref 2.2–3.9)
M-Spike, %: 2.4 g/dL — ABNORMAL HIGH
Total Protein ELP: 7.4 g/dL (ref 6.0–8.5)

## 2015-06-06 NOTE — Telephone Encounter (Signed)
Patient's daughter [name] [phone]) called reporting "Dr. Jeb Levering gave choice yesterday to remain with Dr. Julien Nordmann.  He ordered labs that Dr. Julien Nordmann usually orders.  We will notify Dr. Epimenio Sarin Monday of Dad's wishes for Dr. Julien Nordmann on Monday when we receive PET results.   Do we need to keep the lab appointment on 06-19-2015 planned before the 06-26-2015 F/U with Dr. Julien Nordmann?  Could someone call me because my mom has memory problems."  [facility] lab results for 06-05-2015 include all needed here except for LDH.  Will notify Dr. Julien Nordmann for his review and further orders or advice for patient.

## 2015-06-07 ENCOUNTER — Encounter
Admission: RE | Admit: 2015-06-07 | Discharge: 2015-06-07 | Disposition: A | Discharge status: Home / Self Care | Payer: PRIVATE HEALTH INSURANCE | Source: Ambulatory Visit | Attending: Oncology | Admitting: Oncology

## 2015-06-07 DIAGNOSIS — C320 Malignant neoplasm of glottis: Secondary | ICD-10-CM | POA: Diagnosis present

## 2015-06-07 LAB — BETA 2 MICROGLOBULIN, SERUM: BETA 2 MICROGLOBULIN: 4.7 mg/L — AB (ref 0.6–2.4)

## 2015-06-07 LAB — KAPPA/LAMBDA LIGHT CHAINS
KAPPA FREE LGHT CHN: 3.98 mg/L (ref 3.30–19.40)
KAPPA, LAMDA LIGHT CHAIN RATIO: 0.03 — AB (ref 0.26–1.65)
Lambda free light chains: 128.12 mg/L — ABNORMAL HIGH (ref 5.71–26.30)

## 2015-06-07 LAB — GLUCOSE, CAPILLARY: Glucose-Capillary: 87 mg/dL (ref 65–99)

## 2015-06-07 MED ORDER — FLUDEOXYGLUCOSE F - 18 (FDG) INJECTION
12.9800 | Freq: Once | INTRAVENOUS | Status: DC | PRN
  Administered 2015-06-07: 12.98 via INTRAVENOUS
  Filled 2015-06-07: qty 12.98

## 2015-06-07 NOTE — Telephone Encounter (Signed)
Per Julien Nordmann I told Pam that pt does not need lab appt on 9/21. appt cancelled.

## 2015-06-07 NOTE — Telephone Encounter (Signed)
Note to [facility] to see if pt  needs to come in for labs on 9/21 for -QIG/LDH which were not done 9/9 with rest of myleoma labs.

## 2015-06-10 ENCOUNTER — Encounter: Payer: Self-pay | Admitting: Oncology

## 2015-06-10 ENCOUNTER — Ambulatory Visit
Admission: RE | Admit: 2015-06-10 | Discharge: 2015-06-10 | Disposition: A | Discharge status: Home / Self Care | Payer: PPO | Source: Ambulatory Visit | Attending: Radiation Oncology | Admitting: Radiation Oncology

## 2015-06-10 ENCOUNTER — Inpatient Hospital Stay (HOSPITAL_BASED_OUTPATIENT_CLINIC_OR_DEPARTMENT_OTHER): Payer: PPO | Admitting: Oncology

## 2015-06-10 ENCOUNTER — Telehealth: Payer: Self-pay | Admitting: Medical Oncology

## 2015-06-10 VITALS — BP 145/66 | HR 86 | Temp 98.0°F | Resp 18 | Ht 66.0 in | Wt 137.5 lb

## 2015-06-10 DIAGNOSIS — C9000 Multiple myeloma not having achieved remission: Secondary | ICD-10-CM

## 2015-06-10 DIAGNOSIS — Z7982 Long term (current) use of aspirin: Secondary | ICD-10-CM

## 2015-06-10 DIAGNOSIS — Z87891 Personal history of nicotine dependence: Secondary | ICD-10-CM

## 2015-06-10 DIAGNOSIS — Z8701 Personal history of pneumonia (recurrent): Secondary | ICD-10-CM

## 2015-06-10 DIAGNOSIS — Z79899 Other long term (current) drug therapy: Secondary | ICD-10-CM

## 2015-06-10 DIAGNOSIS — Z87442 Personal history of urinary calculi: Secondary | ICD-10-CM

## 2015-06-10 DIAGNOSIS — K219 Gastro-esophageal reflux disease without esophagitis: Secondary | ICD-10-CM

## 2015-06-10 DIAGNOSIS — Z809 Family history of malignant neoplasm, unspecified: Secondary | ICD-10-CM

## 2015-06-10 DIAGNOSIS — Z85828 Personal history of other malignant neoplasm of skin: Secondary | ICD-10-CM

## 2015-06-10 DIAGNOSIS — C320 Malignant neoplasm of glottis: Secondary | ICD-10-CM

## 2015-06-10 DIAGNOSIS — E780 Pure hypercholesterolemia: Secondary | ICD-10-CM

## 2015-06-10 DIAGNOSIS — F17220 Nicotine dependence, chewing tobacco, uncomplicated: Secondary | ICD-10-CM

## 2015-06-10 DIAGNOSIS — I739 Peripheral vascular disease, unspecified: Secondary | ICD-10-CM | POA: Diagnosis not present

## 2015-06-10 DIAGNOSIS — M818 Other osteoporosis without current pathological fracture: Secondary | ICD-10-CM

## 2015-06-10 DIAGNOSIS — D649 Anemia, unspecified: Secondary | ICD-10-CM

## 2015-06-10 DIAGNOSIS — C329 Malignant neoplasm of larynx, unspecified: Secondary | ICD-10-CM

## 2015-06-10 DIAGNOSIS — I2510 Atherosclerotic heart disease of native coronary artery without angina pectoris: Secondary | ICD-10-CM

## 2015-06-10 DIAGNOSIS — Z8673 Personal history of transient ischemic attack (TIA), and cerebral infarction without residual deficits: Secondary | ICD-10-CM

## 2015-06-10 DIAGNOSIS — Z85038 Personal history of other malignant neoplasm of large intestine: Secondary | ICD-10-CM

## 2015-06-10 DIAGNOSIS — Z510 Encounter for antineoplastic radiation therapy: Secondary | ICD-10-CM | POA: Insufficient documentation

## 2015-06-10 DIAGNOSIS — R0609 Other forms of dyspnea: Secondary | ICD-10-CM

## 2015-06-10 DIAGNOSIS — H9190 Unspecified hearing loss, unspecified ear: Secondary | ICD-10-CM

## 2015-06-10 DIAGNOSIS — F0390 Unspecified dementia without behavioral disturbance: Secondary | ICD-10-CM

## 2015-06-10 DIAGNOSIS — G2581 Restless legs syndrome: Secondary | ICD-10-CM

## 2015-06-10 DIAGNOSIS — I10 Essential (primary) hypertension: Secondary | ICD-10-CM

## 2015-06-10 NOTE — Progress Notes (Signed)
Encounter opened in error

## 2015-06-10 NOTE — Telephone Encounter (Signed)
Pt wanting to transfer including radiation care to Dr Julien Nordmann. PET scan abnormal and asks for plan. Note to [place].

## 2015-06-10 NOTE — Progress Notes (Signed)
[place] @ [facility] Telephone:(336) [phone]  Fax:(336) [place] OB: 1929-12-24  MR#: [id number]  EHO#:[id number]  Patient Care Team: Lajean Manes, MD as PCP - General (Internal Medicine) Curt Bears, MD as Consulting Physician (Oncology) Beverly Gust, MD (Unknown Physician Specialty)  CHIEF COMPLAINT:  Chief Complaint  Patient presents with  . Cancer    vocal cord cancer    VISIT DIAGNOSIS:   No diagnosis found.   Pollypoid hemorrhagic mass of the left vocal cord biopsies consistent with squamous cell carcinoma Right vocal cord shows dysplasia  Oncology Flowsheet 05/02/2014 05/28/2015  dexamethasone (DECADRON) IJ - -  methylPREDNISolone sodium succinate 125 mg/2 mL (SOLU-MEDROL) IJ - -  ondansetron (ZOFRAN) IJ - -  ondansetron (ZOFRAN) IV - -    INTERVAL HISTORY: 81 year old gentleman came today with his wife and daughter for further evaluation regarding recently diagnosed carcinoma focal cord. Patient started having hoarseness of voice over  Last 3 weeks.  No difficulty in swallowing.  Patient underwent initial evaluation with fiberoptic laryngoscopy by Dr. Tami Ribas and was found to have hemorrhagic polypoid mass of the left vocal cord.  Biopsy was positive for squamous cell carcinoma.  Right vocal cord biopsy was dysplasia.  Patient was referred to me for further evaluation Patient has significant peripheral vascular disease peripheral stent placement.  Has a history of smoldering multiple myeloma being followed by oncologist in [place] Denies any significant weight loss June 10, 2015 Patient is here for further follow-up after PET scan has been done. Patient is also being evaluated today by radiation oncologist Case was discussed in tumor conference   REVIEW OF SYSTEMS:   Gen. status: Patient does not have any significant weight loss fever HEENT: Started having hoarseness of voice over  3-4 weeks.  No difficulty swallowing.   Ace and is hard of hearing and using hearing aid Lungs: No cough or shortness of breath GI: No nausea no vomiting no diarrhea Cardiac: No chest pain Musculoskeletal system no bone pains.  Patient carries diagnoses of smoldering multiple myeloma being followed by oncologist at [facility].  Was evaluated in March and review of the notes shows no significant change in myeloma protein Skin: No rash Neurological system: No headache no dizziness Lower extremity no swelling     As per HPI. Otherwise, a complete review of systems is negatve.  PAST MEDICAL HISTORY: Past Medical History  Diagnosis Date  . Peripheral vascular disease   . Transient global amnesia   . Hypercholesterolemia   . Mesenteric thrombosis     post colon resection  . Hypertension   . Macrocytic anemia   . Glaucoma   . Osteoporosis   . GERD (gastroesophageal reflux disease)   . Cholelithiasis   . Shingles 2009  . Pneumonia 02-2011    left lower lobe  . CAD (coronary artery disease)   . Colon cancer     Multiple Mylemona  . Stroke     'Mini Stroke"  . Dementia   . Skin cancer     ears  . Peripheral arterial disease   . Shortness of breath dyspnea     with exertion  . Arthritis   . Restless leg syndrome   . Squamous cell carcinoma of larynx 06/05/2015  . Hearing loss     PAST SURGICAL HISTORY: Past Surgical History  Procedure Laterality Date  . Laminectomies      multiple  . Exploratory laparotomy      treat twisted intestines  . Colectomy  to remove adenoma  . Femoral artery stenting    . Back surgery      lower back  . Endarterectomy femoral Left 05/02/2014    Procedure: LEFT FEMORAL ENDARTERECTOMY ;  Surgeon: Rosetta Posner, MD;  Location: [place];  Service: Vascular;  Laterality: Left;  . Abdominal aortagram N/A 03/28/2014    Procedure: ABDOMINAL AORTAGRAM;  Surgeon: Rosetta Posner, MD;  Location: [facility] CATH LAB;  Service: Cardiovascular;  Laterality: N/A;  . Eye surgery Left     cataract extraction    . Microlaryngoscopy N/A 05/28/2015    Procedure: MICROLARYNGOSCOPY/ WITH BIOPSY;  Surgeon: Beverly Gust, MD;  Location: [facility] ORS;  Service: ENT;  Laterality: N/A;    FAMILY HISTORY Family History  Problem Relation Age of Onset  . Diabetes Mother   . Heart disease Mother   . Hypertension Mother   . Heart disease Father   . Hypertension Father   . Diabetes Father   . Hyperlipidemia Father   . Peripheral vascular disease Father   . Cancer Brother   . Diabetes Brother   . Hypertension Brother   . Heart disease Brother     before age 26  . Cancer Sister         ADVANCED DIRECTIVES:  Patient does have advance healthcare directive, Patient   does not desire to make any changes  HEALTH MAINTENANCE: Social History  Substance Use Topics  . Smoking status: Former Smoker    Types: Cigarettes    Quit date: 09/28/1988  . Smokeless tobacco: Current User    Types: Chew  . Alcohol Use: No     Comment: rare    :  No Known Allergies  Current Outpatient Prescriptions  Medication Sig Dispense Refill  . aspirin EC 81 MG tablet Take 81 mg by mouth daily.    . clonazePAM (KLONOPIN) 0.5 MG tablet Take 0.5 mg by mouth at bedtime.     Marland Kitchen esomeprazole (NEXIUM) 40 MG capsule Take 20 mg by mouth at bedtime.     . hydrochlorothiazide (HYDRODIURIL) 25 MG tablet Take 25 mg by mouth daily.    . Hydrocodone-Acetaminophen 5-300 MG TABS Take 1-2 tablets by mouth every 4 (four) hours as needed. 30 each 0  . latanoprost (XALATAN) 0.005 % ophthalmic solution Place 1 drop into both eyes at bedtime.     Marland Kitchen lisinopril (PRINIVIL,ZESTRIL) 5 MG tablet Take 5 mg by mouth daily.    . simvastatin (ZOCOR) 20 MG tablet Take 20 mg by mouth at bedtime.     . Timolol Maleate PF 0.5 % SOLN Apply 1 drop to eye daily.    . traMADol (ULTRAM) 50 MG tablet Take 1 tablet (50 mg total) by mouth every 6 (six) hours as needed. (Patient not taking: Reported on 06/10/2015) 20 tablet 0   No current facility-administered  medications for this visit.   Facility-Administered Medications Ordered in Other Visits  Medication Dose Route Frequency Provider Last Rate Last Dose  . fludeoxyglucose F - 18 (FDG) injection 12.98 milli Curie  12.98 milli Curie Intravenous Once PRN Medication Radiologist, MD   12.98 milli Curie at 06/07/15 0945    OBJECTIVE: PHYSICAL EXAM: \General status: Patient is alert and oriented not any acute distress HEENT: Patient does not have any significant abnormality detected on examination. Lungs: Emphysematous chest.  At entry diminished on both sides. Cardiac: Soft systolic murmur Heart sounds are normal Abdominal exam revealed normal bowel sounds. The abdomen was soft, non-tender, and without masses, organomegaly, or  appreciable enlargement of the abdominal aorta.  Patient had a previous multiple abdominal surgeries including surgery for colon adenoma versus cancer and previous history of intussusception Neurologically, the patient was awake, alert, and oriented to person, place and time. There were no obvious focal neurologic abnormalities. Examination of the skin revealed no evidence of significant rashes, suspicious appearing nevi or other concerning lesions. Lymphatic system: Supraclavicular, cervical, axillary, inguinal lymph nodes are not palpable Lower extremity no edema patient had previous history of peripheral vascular disease and stent placement  Filed Vitals:   06/10/15 1108  BP: 145/66  Pulse: 86  Temp: 98 F (36.7 C)  Resp: 18     Body mass index is 22.2 kg/(m^2).    ECOG FS:1 - Symptomatic but completely ambulatory  LAB RESULTS:  Hospital Outpatient Visit on 06/07/2015  Component Date Value Ref Range Status  . 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  Smoldering  multiple myeloma presently not on any treatment reviewing the chart shows probably IgA multiple myeloma.  Being followed by an oncologist at [facility] 3.  Peripheral vascular disease 4.  Patient continues to use smokeless tobacco and chewing tobacco at been previous chronic smoker and counseling has been done 5.  PET scan has been reviewed.  There are no enlarged lymph node in the neck.  There are some hypermetabolic lymph node in the mediastinotomy H needs to be followed. PLAN:   PET scan for further staging Discussed situation with the ENT surgeon patient is not a great candidate for surgical intervention in May respond well to radiation therapy.  If PET scan is showing stage I disease there is no role for addition of chemotherapy at present time Smoldering myeloma patient is not sure whether he wants to get continuing follow-up at [facility] or [facility] oncology care locally.  SIEP, beta-2 microglobulin and light chain has been ordered. Macrocytic anemia possibility of either involvement of bone marrow by multiple myeloma causing this problem versus other etiology cannot be ruled out. Iron studies are normal.  Vitamin B12 and folate acid studies are not available at present time Not sure whether bone marrow aspiration and biopsy were done in the past to not we will try to get some records from the [facility] oncology Department. Marland Kitchen  PET scan has been reviewed and shows hypermetabolic vocal cord lesion.  However there are some hypermetabolic mediastinal lymph node which on review of CT scan the are not enlarged. Patient is a chronic smoker and is worrisome from another primary in the lung Or metastases from laryngeal cancer. I do not know whether this is approachable with bronchoscopy mediastinoscopy I discussed situation with Dr. Donella Stade radiation oncologist Patient is seeing the [facility] and desires to continue care.  In that case may be I will give a phone call to Dr. Julien Nordmann regarding  abnormal PET scan finding. Dr. Baruch Gouty is going to address this issue with patient  As patient is currently followed by radiation oncologist as well as medical oncologists at [facility] I have not given any further appointment to him. Patient expressed understanding and was in agreement with this plan. He also understands that He can call clinic at any time with any questions, concerns, or complaints.    Multiple myeloma   Staging form: Multiple Myeloma, AJCC 6th  Edition     Clinical: Stage IA - Signed by Curt Bears, MD on 04/04/2014 Squamous cell carcinoma of larynx   Staging form: Larynx - Supraglottis, AJCC 7th Edition     Clinical: Stage I (T1, N0, M0) - Signed by Forest Gleason, MD on 06/05/2015   Forest Gleason, MD   06/10/2015 11:21 AM

## 2015-06-11 ENCOUNTER — Telehealth: Payer: Self-pay | Admitting: Medical Oncology

## 2015-06-11 ENCOUNTER — Telehealth: Payer: Self-pay | Admitting: *Deleted

## 2015-06-11 DIAGNOSIS — C320 Malignant neoplasm of glottis: Secondary | ICD-10-CM

## 2015-06-11 DIAGNOSIS — C329 Malignant neoplasm of larynx, unspecified: Secondary | ICD-10-CM

## 2015-06-11 NOTE — Telephone Encounter (Signed)
I  spoke to [facility] and told her she needs to request a referral from Dr Jeb Levering.

## 2015-06-11 NOTE — Telephone Encounter (Signed)
-----   Message from Curt Bears, MD sent at 06/10/2015  5:39 PM EDT ----- Regarding: RE: transfer care to [facility]  We need referral from Dr. Hoyt Koch to [place] for his new cancer. ----- Message -----    From: Ardeen Garland, RN    Sent: 06/10/2015   4:08 PM      To: Curt Bears, MD Subject: transfer care to [facility] Dr Donella Stade briefly today and was told pt " has something in lung". Pt wants to transfer all care here including radiation therapy . Dr Donella Stade aware of this and did not have a complete visit with pt. What do you want me to do?

## 2015-06-11 NOTE — Telephone Encounter (Signed)
Requesting referral to Dr Earlie Server so he can continue treatments

## 2015-06-19 ENCOUNTER — Other Ambulatory Visit: Payer: PRIVATE HEALTH INSURANCE

## 2015-06-26 ENCOUNTER — Telehealth: Payer: Self-pay | Admitting: Internal Medicine

## 2015-06-26 ENCOUNTER — Ambulatory Visit (HOSPITAL_BASED_OUTPATIENT_CLINIC_OR_DEPARTMENT_OTHER): Payer: PPO | Admitting: Internal Medicine

## 2015-06-26 ENCOUNTER — Other Ambulatory Visit (HOSPITAL_BASED_OUTPATIENT_CLINIC_OR_DEPARTMENT_OTHER): Payer: PPO

## 2015-06-26 ENCOUNTER — Encounter: Payer: Self-pay | Admitting: Internal Medicine

## 2015-06-26 VITALS — BP 101/38 | HR 75 | Temp 99.1°F | Resp 17 | Ht 66.0 in | Wt 141.3 lb

## 2015-06-26 DIAGNOSIS — C329 Malignant neoplasm of larynx, unspecified: Secondary | ICD-10-CM

## 2015-06-26 DIAGNOSIS — C9000 Multiple myeloma not having achieved remission: Secondary | ICD-10-CM

## 2015-06-26 LAB — COMPREHENSIVE METABOLIC PANEL (CC13)
ALT: 12 U/L (ref 0–55)
AST: 12 U/L (ref 5–34)
Albumin: 2.5 g/dL — ABNORMAL LOW (ref 3.5–5.0)
Alkaline Phosphatase: 48 U/L (ref 40–150)
Anion Gap: 11 mEq/L (ref 3–11)
BUN: 25.3 mg/dL (ref 7.0–26.0)
CHLORIDE: 103 meq/L (ref 98–109)
CO2: 25 meq/L (ref 22–29)
Calcium: 9.9 mg/dL (ref 8.4–10.4)
Creatinine: 1.3 mg/dL (ref 0.7–1.3)
EGFR: 49 mL/min/{1.73_m2} — AB (ref >90)
GLUCOSE: 107 mg/dL (ref 70–140)
POTASSIUM: 4.3 meq/L (ref 3.5–5.1)
SODIUM: 139 meq/L (ref 136–145)
Total Bilirubin: 0.31 mg/dL (ref 0.20–1.20)
Total Protein: 7.5 g/dL (ref 6.4–8.3)

## 2015-06-26 LAB — CBC WITH DIFFERENTIAL/PLATELET
BASO%: 0.3 % (ref 0.0–2.0)
BASOS ABS: 0 10*3/uL (ref 0.0–0.1)
EOS%: 0.3 % (ref 0.0–7.0)
Eosinophils Absolute: 0 10*3/uL (ref 0.0–0.5)
HCT: 24.8 % — ABNORMAL LOW (ref 38.4–49.9)
HEMOGLOBIN: 8.4 g/dL — AB (ref 13.0–17.1)
LYMPH%: 8.1 % — AB (ref 14.0–49.0)
MCH: 36.8 pg — AB (ref 27.2–33.4)
MCHC: 34.1 g/dL (ref 32.0–36.0)
MCV: 108 fL — ABNORMAL HIGH (ref 79.3–98.0)
MONO#: 0.3 10*3/uL (ref 0.1–0.9)
MONO%: 6.1 % (ref 0.0–14.0)
NEUT#: 4.6 10*3/uL (ref 1.5–6.5)
NEUT%: 85.2 % — AB (ref 39.0–75.0)
Platelets: 187 10*3/uL (ref 140–400)
RBC: 2.29 10*6/uL — AB (ref 4.20–5.82)
RDW: 15.3 % — AB (ref 11.0–14.6)
WBC: 5.4 10*3/uL (ref 4.0–10.3)
lymph#: 0.4 10*3/uL — ABNORMAL LOW (ref 0.9–3.3)

## 2015-06-26 LAB — LACTATE DEHYDROGENASE (CC13): LDH: 129 U/L (ref 125–245)

## 2015-06-26 LAB — IGG, IGA, IGM
IGA: 2180 mg/dL — AB (ref 68–379)
IgG (Immunoglobin G), Serum: 74 mg/dL — ABNORMAL LOW (ref 650–1600)

## 2015-06-26 NOTE — Telephone Encounter (Signed)
Pt confirmed labs/ov per 09/28 POF, gave pt AVS and Calendar... KJ °

## 2015-06-26 NOTE — Progress Notes (Signed)
[facility] [phone]   [phone]  OFFICE PROGRESS NOTE  Mathews Argyle, MD 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 2) Multiple myeloma diagnosed in June of 2015  PRIOR THERAPY: None  CURRENT THERAPY: Observation.  INTERVAL HISTORY: Eric [name] 81 y.o. male returns to the clinic today for followup visit accompanied by his wife and daughter. The patient has been complaining of hoarseness of his progress over the last few weeks. He was seen by ENT, Dr. Tami Ribas in [facility]. He underwent microlaryngoscopy with biopsy of bilateral vocal cord. There was areas of exophytic mucosa worse on the left anterior vocal fold. The final pathology (CASE: [id number] ) of the left vocal cord biopsy was consistent with squamous cell carcinoma with features highly suspicious for invasion. The biopsy of the right vocal cord showed severe squamous dysplasia/carcinoma in situ. The patient was seen by Dr. Jeb Levering at [facility] and he ordered a PET scan which was performed on 06/07/2015 and it showed highly hypermetabolic mass of the vocal cords with abnormal activity spanning along the anterior commissure and potentially attenuating the adjacent thyroid lamina/thyroid cartilage. There was no abnormal hypermetabolic nodal activity in the neck but there was multiple hypermetabolic lymph nodes in the mediastinum and these were not enlarged but worrisome for metastatic disease from the leading laryngeal cancer. The patient requested her care to be transferred to [facility] as I have seen him in the past for his diagnosis of multiple myeloma. History of fine today with no specific complaints except for the hoarseness of his voice. He denied having any significant fatigue or weakness. He denied having any weight loss or night sweats. He has no chest pain,  shortness of breath, cough or hemoptysis. He had recent myeloma panel performed at [facility] that showed no evidence for disease progression. The patient is here today for evaluation and discussion of his treatment options.  MEDICAL HISTORY: Past Medical History  Diagnosis Date  . Peripheral vascular disease   . Transient global amnesia   . Hypercholesterolemia   . Mesenteric thrombosis     post colon resection  . Hypertension   . Macrocytic anemia   . Glaucoma   . Osteoporosis   . GERD (gastroesophageal reflux disease)   . Cholelithiasis   . Shingles 2009  . Pneumonia 02-2011    left lower lobe  . CAD (coronary artery disease)   . Colon cancer     Multiple Mylemona  . Stroke     'Mini Stroke"  . Dementia   . Skin cancer     ears  . Peripheral arterial disease   . Shortness of breath dyspnea     with exertion  . Arthritis   . Restless leg syndrome   . Squamous cell carcinoma of larynx 06/05/2015  . Hearing loss     ALLERGIES:  has No Known Allergies.  MEDICATIONS:  Current Outpatient Prescriptions  Medication Sig Dispense Refill  . aspirin EC 81 MG tablet Take 81 mg by mouth daily.    . clonazePAM (KLONOPIN) 0.5 MG tablet Take 0.5 mg by mouth at bedtime.     Marland Kitchen esomeprazole (NEXIUM) 40 MG capsule Take 20 mg by mouth at bedtime.     . hydrochlorothiazide (HYDRODIURIL) 25 MG tablet Take 25 mg by mouth daily.    . Hydrocodone-Acetaminophen 5-300 MG TABS Take 1-2 tablets by  mouth every 4 (four) hours as needed. 30 each 0  . latanoprost (XALATAN) 0.005 % ophthalmic solution Place 1 drop into both eyes at bedtime.     Marland Kitchen lisinopril (PRINIVIL,ZESTRIL) 5 MG tablet Take 5 mg by mouth daily.    . simvastatin (ZOCOR) 20 MG tablet Take 20 mg by mouth at bedtime.     . Timolol Maleate PF 0.5 % SOLN Apply 1 drop to eye daily.    . traMADol (ULTRAM) 50 MG tablet Take 1 tablet (50 mg total) by mouth every 6 (six) hours as needed. (Patient not taking: Reported on 06/10/2015) 20  tablet 0   No current facility-administered medications for this visit.    SURGICAL HISTORY:  Past Surgical History  Procedure Laterality Date  . Laminectomies      multiple  . Exploratory laparotomy      treat twisted intestines  . Colectomy      to remove adenoma  . Femoral artery stenting    . Back surgery      lower back  . Endarterectomy femoral Left 05/02/2014    Procedure: LEFT FEMORAL ENDARTERECTOMY ;  Surgeon: Larina Earthly, MD;  Location[facility] OR;  Service: Vascular;  Laterality: Left;  . Abdominal aortagram N/A 03/28/2014    Procedure: ABDOMINAL AORTAGRAM;  Surgeon: Larina Earthly, MD;  Location: [facility] CATH LAB;  Service: Cardiovascular;  Laterality: N/A;  . Eye surgery Left     cataract extraction  . Microlaryngoscopy N/A 05/28/2015    Procedure: MICROLARYNGOSCOPY/ WITH BIOPSY;  Surgeon: Linus Salmons, MD;  Location: [facility] ORS;  Service: ENT;  Laterality: N/A;    REVIEW OF SYSTEMS:  Constitutional: negative Eyes: negative Ears, nose, mouth, throat, and face: positive for hoarseness Respiratory: negative Cardiovascular: negative Gastrointestinal: negative Genitourinary:negative Integument/breast: negative Hematologic/lymphatic: negative Musculoskeletal:negative Neurological: negative Behavioral/Psych: negative Endocrine: negative Allergic/Immunologic: negative   PHYSICAL EXAMINATION: General appearance: alert, cooperative and no distress Head: Normocephalic, without obvious abnormality, atraumatic Neck: no adenopathy, no JVD, supple, symmetrical, trachea midline and thyroid not enlarged, symmetric, no tenderness/mass/nodules Lymph nodes: Cervical, supraclavicular, and axillary nodes normal. Resp: clear to auscultation bilaterally Back: symmetric, no curvature. ROM normal. No CVA tenderness. Cardio: regular rate and rhythm, S1, S2 normal, no murmur, click, rub or gallop GI: soft, non-tender; bowel sounds normal; no masses,  no organomegaly Extremities: extremities  normal, atraumatic, no cyanosis or edema Neurologic: Alert and oriented X 3, normal strength and tone. Normal symmetric reflexes. Normal coordination and gait  ECOG PERFORMANCE STATUS: 1 - Symptomatic but completely ambulatory  Blood pressure 101/38, pulse 75, temperature 99.1 F (37.3 C), temperature source Oral, resp. rate 17, height 5\' 6"  (1.676 m), weight 141 lb 4.8 oz (64.093 kg), SpO2 99 %.  LABORATORY DATA: Lab Results  Component Value Date   WBC 5.4 06/26/2015   HGB 8.4* 06/26/2015   HCT 24.8* 06/26/2015   MCV 108.0* 06/26/2015   PLT 187 06/26/2015      Chemistry      Component Value Date/Time   NA 139 06/26/2015 0805   NA 138 06/05/2015 1145   K 4.3 06/26/2015 0805   K 3.9 06/05/2015 1145   CL 100* 06/05/2015 1145   CO2 25 06/26/2015 0805   CO2 31 06/05/2015 1145   BUN 25.3 06/26/2015 0805   BUN 19 06/05/2015 1145   CREATININE 1.3 06/26/2015 0805   CREATININE 0.98 06/05/2015 1145      Component Value Date/Time   CALCIUM 9.9 06/26/2015 0805   CALCIUM 9.3 06/05/2015 1145  ALKPHOS 48 06/26/2015 0805   ALKPHOS 67 06/05/2015 1145   AST 12 06/26/2015 0805   AST 15 06/05/2015 1145   ALT 12 06/26/2015 0805   ALT 14* 06/05/2015 1145   BILITOT 0.31 06/26/2015 0805   BILITOT 0.5 06/05/2015 1145       RADIOGRAPHIC STUDIES:  ASSESSMENT AND PLAN: This is a very pleasant 81 years old white male recently diagnosed with:  1) Stage I laryngeal carcinoma involving the left vocal cord with 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 The patient also has hypermetabolic activity in the mediastinal lymph nodes but no enlarged lymph nodes in that area. I had a lengthy discussion with the patient and his family about his condition. I referred the patient to Dr. [name] Moos for evaluation and discussion at the multidisciplinary head and neck cancer clinic. The patient may benefit from a single modality treatment with radiation to the vocal cords. If there is any concern about  the mediastinal lymph nodes he may need referral to thoracic surgery for consideration of mediastinoscopy for this can be closely monitored on the upcoming scans. I will see you for concurrent chemotherapy for the patient with early stage laryngeal carcinoma.  2) multiple myeloma currently asymptomatic with no lytic lesions or decline of his renal function, but his anemia is getting worse recently likely after the surgical procedure. His recent myeloma panel showed no evidence for disease progression. I will continue the patient on observation for now for the multiple myeloma. I would see the patient back for follow-up visit in 2 months for reevaluation and repeat blood work. He was advised to call immediately if he has any concerning symptoms in the interval. The patient voices understanding of current disease status and treatment options and is in agreement with the current care plan.  All questions were answered. The patient knows to call the clinic with any problems, questions or concerns. We can certainly see the patient much sooner if necessary.  Disclaimer: This note was dictated with voice recognition software. Similar sounding words can inadvertently be transcribed and may not be corrected upon review.

## 2015-06-27 ENCOUNTER — Telehealth: Payer: Self-pay | Admitting: *Deleted

## 2015-06-27 NOTE — Telephone Encounter (Signed)
Called Dr. Georges Mouse office in [place].  Patient has signed release.  Documents are to be faxed today.

## 2015-06-27 NOTE — Telephone Encounter (Signed)
  Oncology Nurse Navigator Documentation   Navigator Encounter Type: Introductory phone call (06/27/15 1455) Patient Visit Type: Initial (06/27/15 1455)   Barriers/Navigation Needs: Family concerns (06/27/15 1455)   Interventions: None required (06/27/15 1455)     Placed introductory call, spoke with patient's dtr Pam.   1. She noted that she will be primary contact for her dad as his wife has advanced dementia, he is HoH, unable to speak above a whisper at this time. 2. Introduced myself as the oncology nurse navigator that works with Dr. [name] Moos to whom he has been referred by Dr. Earlie Server and with whom he has an appt next Wed 10/5 12:30 at the [facility].  I explained that he will be seen by additional practitioners (SW, Dietician) as part of the [facility]. 3. She confirmed understanding of referral and appt date/time.  She indicated she and her mother will be accompanying him for appt. 4. I briefly explained my role as a navigator, indicated that I would be joining them during next week's appt.   5. I confirmed understanding of the [facility] location, explained arrival and RadOnc registration process.   6. I provided my contact information, encouraged her to call me with questions/concerns before next week. 7. She verbalized understanding of information provided, expressed appreciation for my call.  [name], RN, BSN, [place] at [place] [phone]           Time Spent with Patient: 30 (06/27/15 1455)

## 2015-07-02 ENCOUNTER — Telehealth: Payer: Self-pay | Admitting: *Deleted

## 2015-07-02 NOTE — Telephone Encounter (Signed)
  Oncology Nurse Navigator Documentation   Navigator Encounter Type: Telephone (07/02/15 1724) Patient Visit Type: Follow-up (07/02/15 1724)     Called pt dtr to see is she had any questions prior to her dad's attendance at [facility] tomorrow.   I reviewed check-in arrival procedure, including valet service, reminded her of a 12:15 arrival in RadOnc for his 12:30 appt. She verbalized understanding of information provided.  [name], RN, BSN, [place] at [place] [phone]                   Time Spent with Patient: 15 (07/02/15 1724)

## 2015-07-03 ENCOUNTER — Other Ambulatory Visit: Payer: Self-pay | Admitting: Radiation Therapy

## 2015-07-03 ENCOUNTER — Ambulatory Visit
Admission: RE | Admit: 2015-07-03 | Discharge: 2015-07-03 | Disposition: A | Discharge status: Home / Self Care | Payer: PRIVATE HEALTH INSURANCE | Source: Ambulatory Visit | Attending: Radiation Oncology | Admitting: Radiation Oncology

## 2015-07-03 ENCOUNTER — Telehealth: Payer: Self-pay | Admitting: *Deleted

## 2015-07-03 ENCOUNTER — Encounter: Payer: Self-pay | Admitting: Radiation Therapy

## 2015-07-03 ENCOUNTER — Encounter: Payer: Self-pay | Admitting: Radiation Oncology

## 2015-07-03 ENCOUNTER — Encounter: Payer: Self-pay | Admitting: *Deleted

## 2015-07-03 ENCOUNTER — Ambulatory Visit: Payer: PRIVATE HEALTH INSURANCE | Admitting: Nutrition

## 2015-07-03 VITALS — BP 132/80 | HR 70 | Temp 97.8°F | Ht 66.0 in | Wt 143.1 lb

## 2015-07-03 DIAGNOSIS — C329 Malignant neoplasm of larynx, unspecified: Secondary | ICD-10-CM

## 2015-07-03 DIAGNOSIS — C320 Malignant neoplasm of glottis: Secondary | ICD-10-CM | POA: Diagnosis not present

## 2015-07-03 MED ORDER — LARYNGOSCOPY SOLUTION RAD-ONC
15.0000 mL | Freq: Once | TOPICAL | Status: AC
  Administered 2015-07-03: 15 mL via TOPICAL
  Filled 2015-07-03: qty 15

## 2015-07-03 NOTE — Telephone Encounter (Signed)
Called patient to inform of Ct for 07-05-15- arrival time - 3:45 pm, spoke with patient's wife - Bonnita Nasuti and she is aware of this test

## 2015-07-03 NOTE — Progress Notes (Signed)
1.  Do you need a wheel chair?    no  2. On oxygen? no  3. Have you ever had any surgery in the body part being scanned? no  4. Have you ever had any surgery on your brain or heart? no           5. Have you ever had surgery on your eyes or ears? Cataract correction 2014                               6. Do you have a pacemaker or defibrillator?  no   7. Do you have a Neurostimulator?  no 8. Claustrophobic?  no  9. Any risk for metal in eyes?  no  10. Injury by bullet, buckshot, or shrapnel?  no  11. Stent?   Yes one in each leg - placed by Dr. Donnetta Hutching in                                                                                                              12. Hx of Cancer? Yes      Squamous Cell carcinoma of the glottis                                                                                              13. Kidney or Liver disease?  no  14. Hx of Lupus, Rheumatoid Arthritis or Scleroderma?  no  15. IV Antibiotics or long term use of NSAIDS?  no  16. HX of Hypertension?  Yes managed by meds   17. Diabetes?  no  18. Allergy to contrast?  no  19. Recent labs. Drawn 06/26/15. Results are in [facility]

## 2015-07-03 NOTE — Progress Notes (Signed)
Head and Neck Cancer Location of Tumor / Histology:   Patient presented months ago with symptoms of:     Nutrition Status Yes No Comments  Weight changes? _0  _1    Swallowing concerns? _2  _3    PEG? _4  _5     Referrals Yes No Comments  Social Work? _6  _7    Dentistry? _8  _9    Swallowing therapy? _10  _11    Nutrition? _12  _13    Med/Onc? _14  _15     Safety Issues Yes No Comments  Prior radiation? _16  _17    Pacemaker/ICD? _18  _19    Possible current pregnancy? _20  _21    Is the patient on methotrexate? _22  _23     Tobacco/Marijuana/Snuff/ETOH use: Quit 26 years ago, use included cigarettes and chew   Past/Anticipated interventions by otolaryngology, if any: flexible Laryngoscopy 06/13/2015 and biopsy.   Past/Anticipated interventions by medical oncology, if any: He is currently followed by Dr. Earlie Server for multiple myeloma, but not vocal cord cancer.     Current Complaints / other details:   No chief complaint on file.

## 2015-07-03 NOTE — Progress Notes (Signed)
Patient was seen in the head and neck clinic.  Patient is 81 year old male diagnosed with laryngeal cancer in August 2016.  He has a history of multiple myeloma diagnosed in June 2015.  Past medical history includes PVD, hypercholesterolemia, hypertension, macrocytic anemia, osteoporosis, GERD, shingles, colon cancer, stroke, dementia and hearing loss.  Medications include Klonopin, Nexium, and Zocor.  Labs include albumin 2.5 on September 28.  Height: 66 inches. Weight: 143.1 pounds October 5. Body weight 140-145 pounds. BMI: 23.11.  I met with patient, wife and daughter, during clinic. Patient currently has no difficulty eating.   He enjoys most foods and his weight is within a usual body weight range for him.  Nutrition diagnosis:  Food and nutrition related knowledge deficit related to laryngeal cancer and associated treatments as evidenced by no prior need for nutrition related information.  Intervention:  Patient educated to consume small frequent meals and snacks, focusing on protein and adequate calories for weight maintenance. Reviewed high protein foods with patient and provided a fact sheet on increasing calories and protein. Reviewed texture modification of foods when his throat becomes sore and swallowing becomes more difficult. Encouraged patient to consider milkshakes or oral nutrition supplements as needed when he is unable to consume adequate soft solids. Questions were answered.  Teach back method was used. Contact information was given.  Monitoring, evaluation, goals: Patient will tolerate adequate calories and protein to minimize loss of lean body mass.  Next visit: To be scheduled.  **Disclaimer: This note was dictated with voice recognition software. Similar sounding words can inadvertently be transcribed and this note may contain transcription errors which may not have been corrected upon publication of note.**

## 2015-07-03 NOTE — Addendum Note (Signed)
Encounter addended by: Eppie Gibson, MD on: 07/03/2015  4:22 PM<BR>     Documentation filed: Follow-up Section

## 2015-07-03 NOTE — Progress Notes (Addendum)
Radiation Oncology         ([phone]) [phone] ________________________________  Initial outpatient Consultation  Name: Eric [name] MRN: [id number]  Date: 07/03/2015  DOB: 1930-03-01  BO:FBPZWCHEN,IDP Marcello Moores, MD  Curt Bears, MD   REFERRING PHYSICIAN: Curt Bears, MD  DIAGNOSIS:    ICD-9-CM ICD-10-CM   1. Carcinoma, glottis (HCC) 161.0 C32.0    Squamous cell carcinoma of the glottis, staging incomplete  HISTORY OF PRESENT ILLNESS::Eric [name] is a 81 y.o. male who presented with hoarseness. He has a history of myeloma that is felt to be relatively stable by medical oncolgy. The myeloma was diagnosed last year. He has been under observation alone for this. The patient saw Dr. Tami Ribas for his hoarseness. He underwent laryngoscopy and biopsy of the bilateral vocal cords. Exophitic mucosa was felt to be worse over the left anterior vocal fold. The patient was reviewed at our tumor board. Biopsy of the bilateral cords revealed squamous cell carcinoma. The pathologist is more confident of invasion in the left vocal cord than the right, based on the specimens. PET scan was performed on 06/07/15. This showed small hypermetabolic mediastinal nodes of unclear etiology. The patient has evidence of old granulomatous disease which could be the cause, but malignancy cannot be excluded. The patient has a hypermetabolic mass of the vocal cords with possible invasion of the thyroid cartilage. He has not yet had any imaging with contrast of the neck. However there were no hypermetabolic nodes in his neck.   The patient is accompanied by his wife and daughter. He lives between [place] and [place]. He saw Dr. Oliva Bustard twice, but switched to Dr. Julien Nordmann to consolidate his care to [facility]. Denies weight loss. Reports mild soreness in his throat. Reports trouble swallowing, that has improved after biopsy. He has more trouble swallowing pills than food. Denies blood in sputum. He has a history of colon cancer that  was surgically treated at least 10 years ago with no chemotherapy or radiation. He has not had any recent colonoscopies. He is retired, he was previously a Health visitor. He quit smoking cigarettes in the 90s. He quit chewing tobacco recently. He saw Dr. Tami Ribas several times.     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    PAST SURGICAL HISTORY: Past Surgical History  Procedure Laterality Date  . Laminectomies      multiple  . Exploratory laparotomy      treat twisted intestines  . Colectomy      to remove adenoma  . Femoral artery stenting    . Back surgery      lower back  . Endarterectomy femoral Left 05/02/2014    Procedure: LEFT FEMORAL ENDARTERECTOMY ;  Surgeon: Rosetta Posner, MD;  Location: [facility];  Service: Vascular;  Laterality: Left;  . Abdominal aortagram N/A 03/28/2014    Procedure: ABDOMINAL AORTAGRAM;  Surgeon: Rosetta Posner, MD;  [facility] CATH LAB;  Service: Cardiovascular;  Laterality: N/A;  . Eye surgery Left     cataract extraction  . Microlaryngoscopy N/A 05/28/2015    Procedure: MICROLARYNGOSCOPY/ WITH BIOPSY;  Surgeon: Beverly Gust, MD;  Location: [facility] ORS;  Service: ENT;  Laterality: N/A;    FAMILY HISTORY: family history includes Cancer in his brother and sister; Diabetes in his brother, father, and mother; Heart disease in his brother, father, and mother; Hyperlipidemia in his father; Hypertension in his  brother, father, and mother; Peripheral vascular disease in his father.  SOCIAL HISTORY:  reports that he quit smoking about 26 years ago. His smoking use included Cigarettes. His smokeless tobacco use includes Chew. He reports that he does not drink alcohol or use illicit drugs.  ALLERGIES: Review of patient's allergies indicates no known allergies.  MEDICATIONS:  Current Outpatient Prescriptions  Medication Sig Dispense Refill  . aspirin EC 81 MG tablet Take 81 mg by mouth daily.    . clonazePAM (KLONOPIN) 0.5 MG tablet Take 0.5 mg by mouth at bedtime.     Marland Kitchen esomeprazole (NEXIUM) 40 MG capsule Take 20 mg by mouth at bedtime.     . hydrochlorothiazide (HYDRODIURIL) 25 MG tablet Take 25 mg by mouth daily.    Marland Kitchen latanoprost (XALATAN) 0.005 % ophthalmic solution Place 1 drop into both eyes at bedtime.     Marland Kitchen lisinopril (PRINIVIL,ZESTRIL) 5 MG tablet Take 5 mg by mouth daily.    . Timolol Maleate PF 0.5 % SOLN Apply 1 drop to eye daily.    . traMADol (ULTRAM) 50 MG tablet Take 1 tablet (50 mg total) by mouth every 6 (six) hours as needed. 20 tablet 0  . simvastatin (ZOCOR) 20 MG tablet Take 20 mg by mouth at bedtime.      No current facility-administered medications for this encounter.    REVIEW OF SYSTEMS:  Notable for that above.   PHYSICAL EXAM:  height is 5\' 6"  (1.676 m) and weight is 143 lb 1.6 oz (64.91 kg). His temperature is 97.8 F (36.6 C). His blood pressure is 132/80 and his pulse is 70.   General: Alert and oriented, in no acute distress HEENT: Head is normocephalic. Extraocular movements are intact. Oropharynx is notable for no lesions or thrush. Hearing aid on the right.  Neck: Neck is notable for no palpable masses. Heart: Regular in rate and rhythm with no murmurs, rubs, or gallops. Chest: Clear to auscultation bilaterally, with no rhonchi, wheezes, or rales. Abdomen: Soft, nontender, nondistended, with no rigidity or guarding. Extremities: No cyanosis or  edema. Lymphatics: see Neck Exam Skin: No concerning lesions. Musculoskeletal: symmetric strength and muscle tone throughout. Neurologic: Cranial nerves II through XII are grossly intact. No obvious focalities. Speech is fluent. Coordination is intact. Psychiatric: Judgment and insight are intact. Affect is appropriate.    PROCEDURE NOTE: After anesthetizing the nasal cavity with topical lidocaine and phenylephrine, the flexible endoscope was introduced and passed through the nasal cavity.  FINDINGS: No bulky tumor seen. Irregular white surface on the left cord. The cords are mobile without any obvious fixation.   ECOG = 1  0 - Asymptomatic (Fully active, able to carry on all predisease activities without restriction)  1 - Symptomatic but completely ambulatory (Restricted in physically strenuous activity but ambulatory and able to carry out work of a light or sedentary nature. For example, light housework, office work)  2 - Symptomatic, <50% in bed during the day (Ambulatory and capable of all self care but unable to carry out any work activities. Up and about more than 50% of waking hours)  3 - Symptomatic, >50% in bed, but not bedbound (Capable of only limited self-care, confined to bed or chair 50% or  more of waking hours)  4 - Bedbound (Completely disabled. Cannot carry on any self-care. Totally confined to bed or chair)  5 - Death   Eustace Pen MM, Creech RH, Tormey DC, et al. [phone]). "Toxicity and response criteria of the [employer]". [place] Oncol. 5 (6): 649-55   LABORATORY DATA:  Lab Results  Component Value Date   WBC 5.4 06/26/2015   HGB 8.4* 06/26/2015   HCT 24.8* 06/26/2015   MCV 108.0* 06/26/2015   PLT 187 06/26/2015   CMP     Component Value Date/Time   NA 139 06/26/2015 0805   NA 138 06/05/2015 1145   K 4.3 06/26/2015 0805   K 3.9 06/05/2015 1145   CL 100* 06/05/2015 1145   CO2 25 06/26/2015 0805   CO2 31 06/05/2015 1145   GLUCOSE  107 06/26/2015 0805   GLUCOSE 104* 06/05/2015 1145   BUN 25.3 06/26/2015 0805   BUN 19 06/05/2015 1145   CREATININE 1.3 06/26/2015 0805   CREATININE 0.98 06/05/2015 1145   CALCIUM 9.9 06/26/2015 0805   CALCIUM 9.3 06/05/2015 1145   PROT 7.5 06/26/2015 0805   PROT 7.9 06/05/2015 1145   ALBUMIN 2.5* 06/26/2015 0805   ALBUMIN 3.4* 06/05/2015 1145   AST 12 06/26/2015 0805   AST 15 06/05/2015 1145   ALT 12 06/26/2015 0805   ALT 14* 06/05/2015 1145   ALKPHOS 48 06/26/2015 0805   ALKPHOS 67 06/05/2015 1145   BILITOT 0.31 06/26/2015 0805   BILITOT 0.5 06/05/2015 1145   GFRNONAA >60 06/05/2015 1145   GFRAA >60 06/05/2015 1145         RADIOGRAPHY: Nm Pet Image Initial (pi) Skull Base To Thigh  06/07/2015   CLINICAL DATA:  Initial treatment strategy for squamous cell carcinoma of the vocal cords. Biopsy 1.5 weeks ago.  EXAM: NUCLEAR MEDICINE PET SKULL BASE TO THIGH  TECHNIQUE: 13.0 mCi F-18 FDG was injected intravenously. Full-ring PET imaging was performed from the skull base to thigh after the radiotracer. CT data was obtained and used for attenuation correction and anatomic localization.  FASTING BLOOD GLUCOSE:  Value: 87 mg/dl  COMPARISON:  Report from 03/12/2011 chest CT  FINDINGS: NECK  Intracranially, relative hypo activity of the right anterior temporal lobe is observed and may be artifactual due to motion, but might warrant dedicated intracranial imaging.  Diffuse abnormal activity anteriorly at the glottic level with abnormal activity along the vocal cords, spanning the anterior commissure, and potentially with some attenuation of the thyroid lamina/thyroid cartilage, maximum standard uptake value 21.8. No discrete hypermetabolic adenopathy in the neck.  CHEST  Small hypermetabolic AP window, lower right paratracheal, and bilateral hilar lymph nodes are present an index right hilar lymph node measures 6 mm in short axis on image 94 of series 3, with maximum standard uptake value 5.5. A left  hilar node has a maximum standard uptake value of 6.2 but is difficult to measure due to indistinct boundaries. Some of the regional lymph nodes, including a right hilar node and a right lower paratracheal lymph node, demonstrate relatively dense calcification.  There is calcification of the aortic valve and in the coronary arteries, as well as atherosclerosis of the aortic arch and branch vessels.  Emphysema is present. Calcified granulomas in the right upper lobe. Calcified granuloma noted in the left upper lobe.  Trace pericardial effusion.  ABDOMEN/PELVIS  No abnormal hypermetabolic activity within the liver, pancreas, adrenal glands, or spleen. No hypermetabolic lymph nodes in the abdomen or  pelvis.  2.2 by 1.6 cm right adrenal adenoma, 0 Hounsfield units. 2.7 by 1.9 cm left adrenal adenoma, 0 Hounsfield units.  Cholelithiasis.  Old granulomatous disease of the spleen.  Aortoiliac atherosclerotic vascular disease with mild abdominal aortic ectasia.  Anastomosis of the colon in the central abdomen. Curvilinear calcifications along the peripheral margins of the central zone of the prostate gland.  SKELETON  Lucency within the spinous process on adjacent lamina of L4 probably relate to postoperative findings at L4-5 given that there is only low grade metabolic activity in this vicinity, maximum standard uptake value 2.6. No compelling findings of metastatic disease. The L4 and L5 vertebra are fused.  There is some mid thoracic wedging which appears chronic.  IMPRESSION: 1. Highly hypermetabolic mass of the vocal cords, with abnormal activity spanning along the anterior commissure and potentially attenuating the adjacent thyroid lamina/thyroid cartilage. 2. Although there is no abnormal hypermetabolic nodal activity in the neck, there are multiple hypermetabolic lymph nodes in the mediastinum. These are not enlarged. Although worrisome for metastatic disease from the laryngeal cancer, the patient also has evidence  of old granulomatous disease, and a smoldering reactivation of granulomatous process can possibly cause hypermetabolic lymph nodes, and remains a differential diagnostic consideration. 3. Bilateral adrenal adenomas. 4. There is lucency in the spinous process and adjacent bilateral lamina at L4 which has somewhat ground-glass bony characteristics. This is only very vaguely metabolic and psoas unlikely to be a metastatic lesion. I favor this is being a postoperative finding given the prior fusion at this level. 5. Relative low activity in the right temporal lobe could potentially be artifactual, but appears asymmetric. Low activity can be a sign of metastatic disease. Accordingly, dedicated intracranial imaging with and without contrast (MRI would provide the most sensitive assessment) is suggested. 6. Other imaging findings of potential clinical significance: Coronary, aortic arch, and branch vessel atherosclerotic vascular disease. Aortoiliac atherosclerotic vascular disease. Calcification of the aortic valve. Emphysema. Trace pericardial effusion. Cholelithiasis. Mid thoracic vertebral wedging, chronic.   Electronically Signed   By: Van Clines M.D.   On: 06/07/2015 11:56      IMPRESSION/PLAN: Clinically, patient has T1N0M0 Glottic cancer, but PET is suggestive of possible thyroid cartilage invasion.  However, this may be a false positive.  There is also a possible brain lesion. Per tumor board recommendations, I have ordered a CT of the neck with contrast. We discussed the benefits to having a brain MRI performed. The patient and family are agreeable to having both scans performed.   I favor not working up the small mediastinal nodes at this point in time.  I think the risks of biopsy outweigh potential benefits. His chest can be observed with future scans. He and family are agreeable to this plan.  If we find abnormalities or invasion through the cartilage that warrants a referral to an ENT surgeon,  we will defer radiation treatment until after surgery. If no invasion of the cartilage is seen, we will continue with radiation treatment. Of course, patient may prefer palliative RT alone if he has a locally advanced laryngeal cancer over laryngectomy.   I anticipate 4-7 weeks of treatment. However we will finalize our treatment plan after seeing the upcoming scan results.  We discussed the potential risks, benefits, and side effects of radiotherapy. We talked in detail about acute and late effects. Some of the most bothersome acute effects may be esophagitis, pain, salivary changes, skin irritation, hair loss, dehydration, weight loss and fatigue. Late effects which  include but are not necessarily limited to dysphagia, hypothyroidism, nerve injury, spinal cord injury, xerostomia, trismus, and neck edema. No guarantees of treatment were given. A consent form was signed and placed in the patient's medical record. The patient is enthusiastic about proceeding with treatment. I look forward to participating in the patient's care.    Simulation (treatment planning) will take place pending CT of the neck with contrast and brain MRI results.     If he pursues, RT - recommend the following future referrals:    MBSS and Speech language pathology for swallowing and/or speech therapy.   Social work for social support.    Baseline labs including TSH.   This document serves as a record of services personally performed by Eppie Gibson, MD. It was created on her behalf by Arlyce Harman, a trained medical scribe. The creation of this record is based on the scribe's personal observations and the provider's statements to them. This document has been checked and approved by the attending provider. __________________________________________   Eppie Gibson, MD

## 2015-07-05 ENCOUNTER — Ambulatory Visit: Payer: PRIVATE HEALTH INSURANCE

## 2015-07-05 ENCOUNTER — Encounter (HOSPITAL_COMMUNITY): Payer: Self-pay

## 2015-07-05 ENCOUNTER — Ambulatory Visit (HOSPITAL_COMMUNITY)
Admission: RE | Admit: 2015-07-05 | Discharge: 2015-07-05 | Disposition: A | Discharge status: Home / Self Care | Payer: PPO | Source: Ambulatory Visit | Attending: Radiation Oncology | Admitting: Radiation Oncology

## 2015-07-05 ENCOUNTER — Ambulatory Visit (HOSPITAL_COMMUNITY): Payer: PRIVATE HEALTH INSURANCE

## 2015-07-05 ENCOUNTER — Ambulatory Visit: Payer: PRIVATE HEALTH INSURANCE | Admitting: Radiation Oncology

## 2015-07-05 ENCOUNTER — Ambulatory Visit (HOSPITAL_COMMUNITY): Payer: PPO

## 2015-07-05 DIAGNOSIS — C320 Malignant neoplasm of glottis: Secondary | ICD-10-CM | POA: Diagnosis present

## 2015-07-05 MED ORDER — IOHEXOL 300 MG/ML  SOLN
75.0000 mL | Freq: Once | INTRAMUSCULAR | Status: AC | PRN
  Administered 2015-07-05: 75 mL via INTRAVENOUS

## 2015-07-08 NOTE — Progress Notes (Signed)
  Oncology Nurse Navigator Documentation   Navigator Encounter Type: Initial RadOnc;Clinic/MDC (07/03/15 1215)     Barriers/Navigation Needs: No barriers at this time (07/03/15 1215)     Met with patient during initial consult with Dr. [name] Moos during [place].   He was accompanied by his wife and dtr Pam. 1. Further introduced myself as his Navigator, explained my role as a member of the Care Team.   2. Provided New Patient Information packet, discussed contents:  Contact information for physician(s), myself, other members of the Care Team.  Advance Directive information ([place] blue pamphlet with LCSW contact info) - Dtr stated ADs completed, will bring copy.  Fall Prevention Patient Safety Plan  Appointment Guideline  ACS Referral form  [place] with highlight of [place] 3. Provided introductory explanation of radiation treatment including SIM planning and purpose of Aquaplast head and shoulder mask, showed them example.   4. They voiced understanding:  Findings of pending CT Neck and MRI Brain will determine treatment plan.  RT will likely involve a course of 4 weeks rather than std 6-7 weeks. 5. Provided a tour of SIM and Tomo areas, explained treatment and arrival procedures. 6. I encouraged them to contact me with questions/concerns as treatments/procedures begin.  They verbalized understanding of information provided.    [name], RN, BSN, [place] at [place] [phone]                Time Spent with Patient: 75 (07/03/15 1215)

## 2015-07-09 ENCOUNTER — Other Ambulatory Visit: Payer: Self-pay | Admitting: Radiation Oncology

## 2015-07-09 ENCOUNTER — Telehealth: Payer: Self-pay | Admitting: *Deleted

## 2015-07-09 DIAGNOSIS — C320 Malignant neoplasm of glottis: Secondary | ICD-10-CM

## 2015-07-09 NOTE — Telephone Encounter (Signed)
  Oncology Nurse Navigator Documentation   Navigator Encounter Type: Telephone (07/09/15 1622) Patient Visit Type: Follow-up (07/09/15 1622)     Per Dr. [name] Moos, called patient, spoke with both him and his wife, informed them: Results of CT scan indicate there is more involved with his laryngeal cancer than last week's physical exam indicated. Dr. [name] Moos is referring him to [facility], Dr. Constance Holster or [facility], for evaluation of surgical treatment option. They verbalized understanding:  This consult will provide guidance for the best treatment.  They will receive a call from [facility]NT to set up an appt. They denied any questions, I encouraged them to call me with future questions/concerns.  [name], RN, BSN, [place] at [place] [phone]                   Time Spent with Patient: 15 (07/09/15 1622)

## 2015-07-10 ENCOUNTER — Telehealth: Payer: Self-pay | Admitting: *Deleted

## 2015-07-10 NOTE — Telephone Encounter (Signed)
  Oncology Nurse Navigator Documentation   Navigator Encounter Type: Telephone (07/10/15 1133)         Interventions: Coordination of Care (07/10/15 1133)     Per Dr. [name] Moos, called [facility], spoke with Kalman Shan, referred patient for consult with Dr. Constance Holster or [facility].  Faxed Dr. Pearlie Oyster 07/03/15 Consult note to [facility] Medical Records [phone]).  [name], RN, BSN, [place] at [place] [phone]           Time Spent with Patient: 15 (07/10/15 1133)

## 2015-07-10 NOTE — Telephone Encounter (Signed)
  Oncology Nurse Navigator Documentation   Navigator Encounter Type: Telephone (07/10/15 1635)         Interventions: Other (07/10/15 1635)     Returned patient wife's VMM in which expressed confusion re ENT appt with Dr. Constance Holster.    I verified with Candise Che EN[facility]T, and informed her appt is 07/23/15 1300.    I provided her [place] EN&T phone # for future needs.  She verbalized understanding, wrote down information. She understands she can contact me.  [name], RN, BSN, [place] at [place] [phone]           Time Spent with Patient: 15 (07/10/15 1635)

## 2015-07-12 ENCOUNTER — Ambulatory Visit (HOSPITAL_COMMUNITY)
Admission: RE | Admit: 2015-07-12 | Discharge: 2015-07-12 | Disposition: A | Discharge status: Home / Self Care | Payer: PPO | Source: Ambulatory Visit | Attending: Radiation Oncology | Admitting: Radiation Oncology

## 2015-07-12 DIAGNOSIS — C329 Malignant neoplasm of larynx, unspecified: Secondary | ICD-10-CM | POA: Diagnosis not present

## 2015-07-12 DIAGNOSIS — G9389 Other specified disorders of brain: Secondary | ICD-10-CM | POA: Insufficient documentation

## 2015-07-12 DIAGNOSIS — R948 Abnormal results of function studies of other organs and systems: Secondary | ICD-10-CM | POA: Diagnosis not present

## 2015-07-12 MED ORDER — GADOBENATE DIMEGLUMINE 529 MG/ML IV SOLN
15.0000 mL | Freq: Once | INTRAVENOUS | Status: AC | PRN
  Administered 2015-07-12: 13 mL via INTRAVENOUS

## 2015-07-15 ENCOUNTER — Telehealth: Payer: Self-pay | Admitting: *Deleted

## 2015-07-15 NOTE — Telephone Encounter (Signed)
  Oncology Nurse Navigator Documentation   Navigator Encounter Type: Telephone;Other (07/15/15 0940)         Interventions: Other (07/15/15 0940)       Per Dr. [name] Moos, informed patient dtr that results of last Friday's brain MRI were negative.  She understands I am calling her parents to inform.  [name], RN, BSN, [place] at [place] [phone]         Time Spent with Patient: 15 (07/15/15 0940)

## 2015-07-15 NOTE — Telephone Encounter (Signed)
  Oncology Nurse Navigator Documentation   Navigator Encounter Type: Telephone (07/15/15 1109)         Interventions: Other (07/15/15 1109)     Per Dr. [name] Moos, spoke with patient wife, informed her results of last Friday's MRI is negative for brain cancer.  She expressed appreciation for information. She verbalized understanding of upcoming appt with Dr. Constance Holster to discuss surgical treatment option. She understands to call me with questions/concerns.  [name], RN, BSN, [place] at [place] [phone]           Time Spent with Patient: 15 (07/15/15 1109)

## 2015-07-24 ENCOUNTER — Encounter: Payer: Self-pay | Admitting: *Deleted

## 2015-07-24 NOTE — Progress Notes (Signed)
  Oncology Nurse Navigator Documentation   Navigator Encounter Type: Written/Letter (07/24/15 1020)         Interventions: Other (07/24/15 1020)     Received email from patient's dtr re prognoses with RT with/without surgery, forwarded to Dr. [name] Moos for reply.  Addressed her question re my navigation if surgical route only.  [name], RN, BSN, [place] at [place] [phone]           Time Spent with Patient: 15 (07/24/15 1020)

## 2015-07-25 ENCOUNTER — Telehealth: Payer: Self-pay | Admitting: *Deleted

## 2015-07-25 NOTE — Telephone Encounter (Signed)
Call from Dr. Janeice Robinson office- RN advised she has spoke with pt's wife and pt will have Total Laryngectomy surgery on 11/2 at [facility] with Dr. Constance Holster. Wife wrote this information down and will give to her daughter.  Message forward to MD for review.

## 2015-07-25 NOTE — Telephone Encounter (Signed)
FYI Patient's spouse called "asking for Dr. Worthy Flank direct phone number.  Would like to know the date of Sederick's surgery."  Reports he has seen Dr. Constance Holster.  Durward Fortes [phone] is the number to reach everyone in the office and the treatment plan is in process of being planned at this time but this nurse will forward request.

## 2015-07-25 NOTE — Telephone Encounter (Signed)
  Oncology Nurse Navigator Documentation   Navigator Encounter Type: Telephone (07/25/15 0940)         Interventions: Other (07/25/15 7680)     Patient wife called.  Stated: 1. They hadn't heard anything from Dr. Earlie Server recently.    I reviewed with her relevant notes from patient's 06/26/15 visit.    She verbalized understanding he was referring husband to see Dr. [name] Moos re laryngeal cancer, she recalled their 07/03/15 appt with her.    She verbalized understanding of 11/30 0800 lab and 0830 appt with Dr. Earlie Server. 2. They met with ENT Dr. Constance Holster this week Tuesday to discuss surgical tmt option.    Her husband has not decided whether he wants surgery or not.  I encouraged further discussion and to include dtr.  Emphasized importance of his making informed decision. She understands I can be contacted with further questions/concerns.  [name], RN, BSN, [place] at [place] [phone]            Time Spent with Patient: 15 (07/25/15 0859)

## 2015-07-26 ENCOUNTER — Ambulatory Visit
Admission: RE | Admit: 2015-07-26 | Discharge: 2015-07-26 | Disposition: A | Discharge status: Home / Self Care | Payer: PPO | Source: Ambulatory Visit | Attending: Internal Medicine | Admitting: Internal Medicine

## 2015-07-26 DIAGNOSIS — Z515 Encounter for palliative care: Secondary | ICD-10-CM

## 2015-07-26 DIAGNOSIS — C320 Malignant neoplasm of glottis: Secondary | ICD-10-CM | POA: Diagnosis not present

## 2015-07-26 DIAGNOSIS — C9000 Multiple myeloma not having achieved remission: Secondary | ICD-10-CM | POA: Diagnosis not present

## 2015-07-26 DIAGNOSIS — Z7189 Other specified counseling: Secondary | ICD-10-CM | POA: Diagnosis not present

## 2015-07-26 NOTE — Consult Note (Signed)
Patient Eric [name]      DOB: 1929/10/29      YOV:[id number]     Consult Note from the Palliative Medicine Team at [facility] Requested by:     PCP: Eric Argyle, MD Reason for Consultation              Phone                                                                                                                                Number:[phone]  Assessment of patients Current state:    Consult is for introduction to the concept of Palliative Medicine, conversation regarding treatment options and emotional support.   This NP Eric [name] reviewed medical records, received report from team, assessed the patient and then meet with Eric [name] and Eric [name] and Eric [name] # [phone] in the outpatient radiation- oncology clinic, referred by Dr Eric [name]  Opportunity for family to discuss thoughts, feelings and fears regarding recent diagnosis and treatment options being offered as curative measures for glottis carcinoma.  All were able to express their concerns specific to quality of life in the future.  Family endorse significant memory  Loss in [name] and mild memory loss in the patient.  All express that living an  Independent life is of utmost importance.   When posed with the question "what do you hope for ", Eric [name] tells me " I wish that when they put me to sleep for that surgery that I don't wake up".      A  discussion was had today regarding advanced directives.  Concepts specific to code status, artifical feeding and hydration, continued IV antibiotics and rehospitalization was had.  The difference between a aggressive medical intervention path  and a palliative comfort care path for this patient at this time was had.  MOST form was introduced  Values and goals of care important to patient and family were attempted to be elicited.  Concept of Hospice and Palliative Care were discussed and available services detailed  Questions and concerns  addressed.  Family encouraged to call with questions or concerns.  PMT will continue to support holistically.   Goals of Care:  Patient is able to clearly verbalize Eric intention that any kind of treatment is for continued quality time.    When posed with the question "what do you hope for ", Eric [name] tells me " I wish that when they put me to sleep for that surgery that I don't wake up".     Brief HPI:  Eric [name] 81 y.o. male with progressive hoarseness.   He was seen by ENT, Dr. Tami Ribas in [facility]. He underwent microlaryngoscopy with biopsy of bilateral vocal cord. There was areas of exophytic mucosa worse on the left anterior vocal fold. The final pathology (CASE: [id number] ) of the left vocal cord biopsy was consistent with squamous cell carcinoma with  features highly suspicious for invasion. The biopsy of the right vocal cord showed severe squamous dysplasia/carcinoma in situ. The patient was seen by Dr. Jeb Levering at [facility] and he ordered a PET scan which was performed on 06/07/2015 and it showed highly hypermetabolic mass of the vocal cords with abnormal activity spanning along the anterior commissure and potentially attenuating the adjacent thyroid lamina/thyroid cartilage. There was no abnormal hypermetabolic nodal activity in the neck but there was multiple hypermetabolic lymph nodes in the mediastinum and these were not enlarged but worrisome for metastatic disease from the leading laryngeal cancer. The patient requested  care to be transferred to [facility] with Dr Earlie Server who sees him for a  diagnosis of multiple myeloma. History today/ no specific complaints except for the hoarseness of Eric voice. He denied having any significant fatigue or weakness. He denied having any weight loss or night sweats.He had recent myeloma panel performed at [facility] that showed no evidence for disease progression.   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 2) Multiple myeloma diagnosed in June of 2015  Patient and family presently contemplating treatment decisions and advanced care planning and anticipatory care needs.  ROS: hoarseness, difficulty swallowing -denies weight loss   PMH:  Past Medical History  Diagnosis Date  . Peripheral vascular disease ([place])   . Transient global amnesia   . Hypercholesterolemia   . 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 Hypertension   . Macrocytic anemia   . Glaucoma   . Osteoporosis   . GERD (gastroesophageal reflux disease)   . Cholelithiasis   . Shingles 2009  . Pneumonia 02-2011    left lower lobe  . CAD (coronary artery disease)   . Colon cancer ([place])     Multiple Mylemona  . Stroke [facility])     'Mini Stroke"  . Dementia   . Skin cancer     ears  . Peripheral arterial disease ([place])   . Shortness of breath dyspnea     with exertion  . Arthritis   . Restless leg syndrome   . Squamous cell carcinoma of larynx ([place]) 06/05/2015  . Hearing loss      PSH: Past Surgical History  Procedure Laterality Date  . Laminectomies      multiple  . Exploratory laparotomy      treat twisted intestines  . Colectomy      to remove adenoma  . Femoral artery stenting    . Back surgery      lower back  . Endarterectomy femoral Left 05/02/2014    Procedure: LEFT FEMORAL ENDARTERECTOMY ;  Surgeon: Rosetta Posner, MD;  Location: [place];  Service: Vascular;  Laterality: Left;  . Abdominal aortagram N/A 03/28/2014    Procedure: ABDOMINAL AORTAGRAM;  Surgeon: Rosetta Posner, MD;  Location: [facility] CATH LAB;  Service: Cardiovascular;  Laterality: N/A;  . Eye surgery Left     cataract extraction  . Microlaryngoscopy N/A 05/28/2015    Procedure: MICROLARYNGOSCOPY/ WITH BIOPSY;  Surgeon: Beverly Gust, MD;  Location: [facility] ORS;  Service: ENT;  Laterality: N/A;   I have reviewed the [place] and SH and  If appropriate update it with new information. No Known Allergies Scheduled  Meds: Continuous Infusions: PRN Meds:.    There were no vitals taken for this visit.     No intake or output data in the 24 hours ending 07/26/15 1101  Physical Exam:  General: well developed,  NAD, ambualtory HEENT:  Poor dentati on, + hoarseness  Ext: without edema Neuro: moves all four extremities, alert and oriented X3  Labs: CBC    Component Value Date/Time   WBC 5.4 06/26/2015 0805   WBC 3.9 06/05/2015 1145   RBC 2.29* 06/26/2015 0805   RBC 2.83* 06/05/2015 1145   HGB 8.4* 06/26/2015 0805   HGB 10.5* 06/05/2015 1145   HCT 24.8* 06/26/2015 0805   HCT 30.6* 06/05/2015 1145   PLT 187 06/26/2015 0805   PLT 234 06/05/2015 1145   MCV 108.0* 06/26/2015 0805   MCV 108.0* 06/05/2015 1145   MCH 36.8* 06/26/2015 0805   MCH 36.9* 06/05/2015 1145   MCHC 34.1 06/26/2015 0805   MCHC 34.2 06/05/2015 1145   RDW 15.3* 06/26/2015 0805   RDW 16.0* 06/05/2015 1145   LYMPHSABS 0.4* 06/26/2015 0805   LYMPHSABS 1.5 06/05/2015 1145   MONOABS 0.3 06/26/2015 0805   MONOABS 0.4 06/05/2015 1145   EOSABS 0.0 06/26/2015 0805   EOSABS 0.1 06/05/2015 1145   BASOSABS 0.0 06/26/2015 0805   BASOSABS 0.0 06/05/2015 1145    BMET    Component Value Date/Time   NA 139 06/26/2015 0805   NA 138 06/05/2015 1145   K 4.3 06/26/2015 0805   K 3.9 06/05/2015 1145   CL 100* 06/05/2015 1145   CO2 25 06/26/2015 0805   CO2 31 06/05/2015 1145   GLUCOSE 107 06/26/2015 0805   GLUCOSE 104* 06/05/2015 1145   BUN 25.3 06/26/2015 0805   BUN 19 06/05/2015 1145   CREATININE 1.3 06/26/2015 0805   CREATININE 0.98 06/05/2015 1145   CALCIUM 9.9 06/26/2015 0805   CALCIUM 9.3 06/05/2015 1145   GFRNONAA >60 06/05/2015 1145   GFRAA >60 06/05/2015 1145    CMP     Component Value Date/Time   NA 139 06/26/2015 0805   NA 138 06/05/2015 1145   K 4.3 06/26/2015 0805   K 3.9 06/05/2015 1145   CL 100* 06/05/2015 1145   CO2 25 06/26/2015 0805   CO2 31 06/05/2015 1145   GLUCOSE 107 06/26/2015 0805    GLUCOSE 104* 06/05/2015 1145   BUN 25.3 06/26/2015 0805   BUN 19 06/05/2015 1145   CREATININE 1.3 06/26/2015 0805   CREATININE 0.98 06/05/2015 1145   CALCIUM 9.9 06/26/2015 0805   CALCIUM 9.3 06/05/2015 1145   PROT 7.5 06/26/2015 0805   PROT 7.9 06/05/2015 1145   ALBUMIN 2.5* 06/26/2015 0805   ALBUMIN 3.4* 06/05/2015 1145   AST 12 06/26/2015 0805   AST 15 06/05/2015 1145   ALT 12 06/26/2015 0805   ALT 14* 06/05/2015 1145   ALKPHOS 48 06/26/2015 0805   ALKPHOS 67 06/05/2015 1145   BILITOT 0.31 06/26/2015 0805   BILITOT 0.5 06/05/2015 1145   GFRNONAA >60 06/05/2015 1145   GFRAA >60 06/05/2015 1145   ECOG PERFORMANCE STATUS* (Eastern Cooperative Oncology Group)  0 Fully active, able to continue with all pre-disease activities without restriction. Pt score  1 Restricted in physically strenuous activity but ambulatory and able to carry out work of a light or sedentary nature, e.g., light house work, office work. 1  2 Ambulatory and capable of all self-care but unable to carry out any work activities. Up and about more than 50% of waking hours.    3 Capable of only limited self-care. Confined to bed or chair more than 50% of waking hours.   4 Completely disabled. Cannot carry on any self-care. Totally confined to bed or chair.  5 Dead.    As published in Am. J. Clin. Oncol.: Eustace Pen, M.M., Colon Flattery., [place], D.C., [place], Sharen Hint., Drexel Iha, P.P.: Toxicity And Response Criteria Of The [facility] Group. [place] 5:789-784, 1982.  The ECOG Performance Status is in the public domain therefore available for public use. To duplicate the scale, please cite the reference above and credit the [facility] Group, Tyler Pita M.D., Group Chair    Time In Time Out Total Time Spent with Patient Total Overall Time  0830 1000 75 min 90  min    Greater than 50%  of this time was spent counseling and coordinating care related to the  above assessment and plan.   Eric Lessen NP  Palliative Medicine Team Team Phone # [phone] Pager [phone]  Family will continue to discuss and process the incredible difficult decisions facing them.  PMT will continue to support holistically.   Discussed with Dr Eric [name]

## 2015-07-28 DIAGNOSIS — Z7189 Other specified counseling: Secondary | ICD-10-CM | POA: Insufficient documentation

## 2015-07-28 DIAGNOSIS — C320 Malignant neoplasm of glottis: Secondary | ICD-10-CM | POA: Insufficient documentation

## 2015-07-28 DIAGNOSIS — Z515 Encounter for palliative care: Secondary | ICD-10-CM | POA: Insufficient documentation

## 2015-07-29 ENCOUNTER — Telehealth: Payer: Self-pay | Admitting: *Deleted

## 2015-07-29 NOTE — Telephone Encounter (Signed)
  Oncology Nurse Navigator Documentation   Navigator Encounter Type: Telephone (07/29/15 1150) Patient Visit Type: Follow-up (07/29/15 1150)       Interventions: Coordination of Care (07/29/15 1150)     LVMM indicating pt has appt for CT SIM this Wed, 07/31/15, at 0800, [place] arrival to Radiation Waiting.  Requested return call to confirm message receipt.  Dtr notified by email.  [name], RN, BSN, [place] at [place] [phone]             Time Spent with Patient: 15 (07/29/15 1150)

## 2015-07-29 NOTE — Telephone Encounter (Signed)
  Oncology Nurse Navigator Documentation   Navigator Encounter Type: Telephone (07/29/15 0355) Patient Visit Type: Follow-up (07/29/15 9741)       Interventions: Coordination of Care (07/29/15 6384)     In follow-up to patient wife Sat VM re tmt and conversation this morning with patient dtr who indicated decision now is to proceed with RT rather than surgery, called patient wife and confirmed their decision, indicated I will call her with an appt time for CT SIM.  She verbalized understanding.  I obtained tentative appts from CT SIM, notified Dr. [name] Moos.  [name], RN, BSN, [place] at [place] [phone]            Time Spent with Patient: 15 (07/29/15 0938)

## 2015-07-30 NOTE — Progress Notes (Signed)
Head and Neck Cancer Simulation, IMRT treatment planning note   Outpatient  Diagnosis:    ICD-9-CM ICD-10-CM   1. Carcinoma, glottis (HCC) 161.0 C32.0    Carcinoma, glottis ([place])   Staging form: Larynx - Glottis, AJCC 7th Edition     Clinical: Stage IVA (T4a, N0, M0) - Signed by Eppie Gibson, MD on 07/31/2015   The patient was taken to the CT simulator and laid in the supine position on the table. An Aquaplast head and shoulder mask was custom fitted to the patient's anatomy. High-resolution CT axial imaging was obtained of the head and neck with contrast. I verified that the quality of the imaging is good for treatment planning. 1 Medically Necessary Treatment Device was fabricated and supervised by me: Aquaplast mask.  Treatment planning note I plan to treat the patient with IMRT. I plan to treat the patient's tumor and bilateral neck nodes. I plan to treat to a total dose of 70 Gray in 35  Fractions (nodes receiving 56 Gy in 35 fractions). Dose calculation was ordered from dosimetry.  IMRT planning Note  IMRT is an important modality to deliver adequate dose to the patient's at risk tissues while sparing the patient's normal structures, including the: esophagus, parotid tissue, mandible, brain stem, spinal cord, oral cavity, brachial plexus.  This justifies the use of IMRT in the patient's treatment.   -----------------------------------  Eppie Gibson, MD

## 2015-07-31 ENCOUNTER — Ambulatory Visit
Admission: RE | Admit: 2015-07-31 | Discharge: 2015-07-31 | Disposition: A | Discharge status: Home / Self Care | Payer: PPO | Source: Ambulatory Visit | Attending: Radiation Oncology | Admitting: Radiation Oncology

## 2015-07-31 ENCOUNTER — Encounter (HOSPITAL_COMMUNITY): Admission: RE | Payer: Self-pay | Source: Ambulatory Visit

## 2015-07-31 ENCOUNTER — Inpatient Hospital Stay (HOSPITAL_COMMUNITY): Admission: RE | Admit: 2015-07-31 | Payer: PPO | Source: Ambulatory Visit | Admitting: Otolaryngology

## 2015-07-31 ENCOUNTER — Ambulatory Visit: Payer: PPO | Admitting: Radiation Oncology

## 2015-07-31 DIAGNOSIS — C320 Malignant neoplasm of glottis: Secondary | ICD-10-CM

## 2015-07-31 DIAGNOSIS — Z510 Encounter for antineoplastic radiation therapy: Secondary | ICD-10-CM | POA: Diagnosis not present

## 2015-07-31 DIAGNOSIS — C9000 Multiple myeloma not having achieved remission: Secondary | ICD-10-CM | POA: Diagnosis not present

## 2015-07-31 SURGERY — LARYNGECTOMY
Anesthesia: General | Site: Neck

## 2015-08-02 ENCOUNTER — Ambulatory Visit: Payer: PPO | Admitting: Radiation Oncology

## 2015-08-05 DIAGNOSIS — Z510 Encounter for antineoplastic radiation therapy: Secondary | ICD-10-CM | POA: Diagnosis not present

## 2015-08-07 DIAGNOSIS — Z510 Encounter for antineoplastic radiation therapy: Secondary | ICD-10-CM | POA: Diagnosis not present

## 2015-08-12 ENCOUNTER — Telehealth: Payer: Self-pay | Admitting: *Deleted

## 2015-08-12 ENCOUNTER — Encounter: Payer: Self-pay | Admitting: Radiation Oncology

## 2015-08-12 ENCOUNTER — Ambulatory Visit
Admission: RE | Admit: 2015-08-12 | Discharge: 2015-08-12 | Disposition: A | Discharge status: Home / Self Care | Payer: PPO | Source: Ambulatory Visit | Attending: Radiation Oncology | Admitting: Radiation Oncology

## 2015-08-12 ENCOUNTER — Encounter: Payer: Self-pay | Admitting: *Deleted

## 2015-08-12 VITALS — BP 149/76 | HR 63 | Temp 97.6°F | Ht 66.0 in | Wt 144.5 lb

## 2015-08-12 DIAGNOSIS — Z510 Encounter for antineoplastic radiation therapy: Secondary | ICD-10-CM | POA: Diagnosis not present

## 2015-08-12 DIAGNOSIS — C320 Malignant neoplasm of glottis: Secondary | ICD-10-CM

## 2015-08-12 DIAGNOSIS — R5383 Other fatigue: Secondary | ICD-10-CM

## 2015-08-12 MED ORDER — SONAFINE EX EMUL
1.0000 "application " | Freq: Once | CUTANEOUS | Status: DC
  Filled 2015-08-12: qty 45

## 2015-08-12 MED ORDER — SONAFINE EX EMUL
1.0000 "application " | Freq: Two times a day (BID) | CUTANEOUS | Status: DC
  Administered 2015-08-12: 1 via TOPICAL
  Filled 2015-08-12: qty 45

## 2015-08-12 NOTE — Addendum Note (Signed)
Encounter addended by: Ernst Spell, RN on: 08/12/2015  3:00 PM<BR>     Documentation filed: Dx Association, Orders

## 2015-08-12 NOTE — Progress Notes (Signed)
   Weekly Management Note:  Outpatient    ICD-9-CM ICD-10-CM   1. Carcinoma, glottis (HCC) 161.0 C32.0 TSH ([facility])     Amb Referral to Nutrition and Diabetic E     Referral to Neuro Rehab  2. Other fatigue 780.79 R53.83 TSH ([facility])  3. Cancer of glottis (HCC) 161.0 C32.0 SLP modified barium swallow    Current Dose:  2 Gy  Projected Dose: 70 Gy   Narrative:  The patient presents for routine under treatment assessment.  CBCT/MVCT images/Port film x-rays were reviewed.  The chart was checked. No new issues.   Physical Findings:  Wt Readings from Last 3 Encounters:  08/12/15 144 lb 8 oz (65.545 kg)  07/03/15 143 lb 1.6 oz (64.91 kg)  06/26/15 141 lb 4.8 oz (64.093 kg)     Vitals - 1 value per visit [id number]  SYSTOLIC 123456  DIASTOLIC 76  Pulse 63  Temperature 97.6  Respirations   Weight (lb) 144.5  Height 5\' 6"   BMI 23.33  VISIT REPORT   NAD, hoarse   CBC    Component Value Date/Time   WBC 5.4 06/26/2015 0805   WBC 3.9 06/05/2015 1145   RBC 2.29* 06/26/2015 0805   RBC 2.83* 06/05/2015 1145   HGB 8.4* 06/26/2015 0805   HGB 10.5* 06/05/2015 1145   HCT 24.8* 06/26/2015 0805   HCT 30.6* 06/05/2015 1145   PLT 187 06/26/2015 0805   PLT 234 06/05/2015 1145   MCV 108.0* 06/26/2015 0805   MCV 108.0* 06/05/2015 1145   MCH 36.8* 06/26/2015 0805   MCH 36.9* 06/05/2015 1145   MCHC 34.1 06/26/2015 0805   MCHC 34.2 06/05/2015 1145   RDW 15.3* 06/26/2015 0805   RDW 16.0* 06/05/2015 1145   LYMPHSABS 0.4* 06/26/2015 0805   LYMPHSABS 1.5 06/05/2015 1145   MONOABS 0.3 06/26/2015 0805   MONOABS 0.4 06/05/2015 1145   EOSABS 0.0 06/26/2015 0805   EOSABS 0.1 06/05/2015 1145   BASOSABS 0.0 06/26/2015 0805   BASOSABS 0.0 06/05/2015 1145     CMP     Component Value Date/Time   NA 139 06/26/2015 0805   NA 138 06/05/2015 1145   K 4.3 06/26/2015 0805   K 3.9 06/05/2015 1145   CL 100* 06/05/2015 1145   CO2 25 06/26/2015 0805   CO2 31 06/05/2015 1145   GLUCOSE 107 06/26/2015  0805   GLUCOSE 104* 06/05/2015 1145   BUN 25.3 06/26/2015 0805   BUN 19 06/05/2015 1145   CREATININE 1.3 06/26/2015 0805   CREATININE 0.98 06/05/2015 1145   CALCIUM 9.9 06/26/2015 0805   CALCIUM 9.3 06/05/2015 1145   PROT 7.5 06/26/2015 0805   PROT 7.9 06/05/2015 1145   ALBUMIN 2.5* 06/26/2015 0805   ALBUMIN 3.4* 06/05/2015 1145   AST 12 06/26/2015 0805   AST 15 06/05/2015 1145   ALT 12 06/26/2015 0805   ALT 14* 06/05/2015 1145   ALKPHOS 48 06/26/2015 0805   ALKPHOS 67 06/05/2015 1145   BILITOT 0.31 06/26/2015 0805   BILITOT 0.5 06/05/2015 1145   GFRNONAA >60 06/05/2015 1145   GFRAA >60 06/05/2015 1145      Impression:  The patient is tolerating radiotherapy.   Plan:  Continue radiotherapy as planned. Orders as above in diagnosis for baseline tests/ support during RT.  -----------------------------------  Eppie Gibson, MD

## 2015-08-12 NOTE — Addendum Note (Signed)
Encounter addended by: Ernst Spell, RN on: 08/12/2015  2:43 PM<BR>     Documentation filed: Dx Association, Orders

## 2015-08-12 NOTE — Addendum Note (Signed)
Encounter addended by: Ernst Spell, RN on: 08/12/2015  3:01 PM<BR>     Documentation filed: Inpatient MAR

## 2015-08-12 NOTE — Progress Notes (Signed)
Eric [name] is here for his first treatment of radiation to his Larynx. He complains of throat tightness sometimes when he swallows. He is still eating normal foods and is not modifying his diet at this time. He denies pain at this time. He reports some fatigue and is taking more naps and going to bed earlier at night.   BP 149/76 mmHg  Pulse 63  Temp(Src) 97.6 F (36.4 C)  Ht 5\' 6"  (1.676 m)  Wt 144 lb 8 oz (65.545 kg)  BMI 23.33 kg/m2  Wt Readings from Last 3 Encounters:  08/12/15 144 lb 8 oz (65.545 kg)  07/03/15 143 lb 1.6 oz (64.91 kg)  06/26/15 141 lb 4.8 oz (64.093 kg)

## 2015-08-12 NOTE — Progress Notes (Signed)
IMRT Device Note outpatient    ICD-9-CM ICD-10-CM   1. Carcinoma, glottis (HCC) 161.0 C32.0     8.2 delivered field widths represent one set of IMRT treatment devices. The code is [phone].  -----------------------------------  Eric Gibson, MD

## 2015-08-12 NOTE — Addendum Note (Signed)
Encounter addended by: Margaret Pyle, [place] on: 08/12/2015  3:01 PM<BR>     Documentation filed: Rx Order Verification

## 2015-08-12 NOTE — Telephone Encounter (Signed)
CALLED PATIENT TO INFORM OF NUTRITION APPT. ON 08-20-15 @ 11:15 AM WITH BARBARA NEFF AND HIS NEURO- REHAB APPT. ON 08-26-15 @ 2 PM [place] @ [place], SPOKE WITH PATIENT'S WIFE- HELEN AND SHE IS AWARE OF THESE APPTS.

## 2015-08-12 NOTE — Addendum Note (Signed)
Encounter addended by: Ernst Spell, RN on: 08/12/2015 12:39 PM<BR>     Documentation filed: Inpatient Patient Education, Notes Section

## 2015-08-12 NOTE — Progress Notes (Signed)
Pt here for patient teaching.  Pt given Radiation and You booklet, Managing Acute Radiation Side Effects for Head and Neck Cancer handout, skin care instructions and Biafine. Pt reports they have not watched the Radiation Therapy Education video on but accepted the link today.  Reviewed areas of pertinence such as fatigue, hair loss, mouth changes, skin changes, throat changes, breast swelling, cough, shortness of breath, earaches and taste changes . Pt able to give teach back of to pat skin and use unscented/gentle soap,apply Biafine bid and avoid applying anything to skin within 4 hours of treatment. Pt verbalizes understanding of information given and will contact nursing with any questions or concerns.

## 2015-08-12 NOTE — Addendum Note (Signed)
Encounter addended by: Margaret Pyle, [place] on: 08/12/2015  2:49 PM<BR>     Documentation filed: Rx Order Verification

## 2015-08-12 NOTE — Progress Notes (Signed)
  Oncology Nurse Navigator Documentation   Navigator Encounter Type: Treatment (08/12/15 1030) Patient Visit Type: [id number] (08/12/15 1030) Treatment Phase: First Radiation Tx (08/12/15 1030)     Checked on patient with RTTs as he was receiving first Tomo tmt, they indicated he was tolerating procedure very well.  Spoke with his wife and son-in-law as they waited for him.  They indicated he was anxious about starting but otherwise OK.  They understand I will continue to follow during tmt.  [name], RN, BSN, [place] at [place] [phone]                Time Spent with Patient: 15 (08/12/15 1030)

## 2015-08-13 ENCOUNTER — Encounter: Payer: Self-pay | Admitting: *Deleted

## 2015-08-13 ENCOUNTER — Ambulatory Visit
Admission: RE | Admit: 2015-08-13 | Discharge: 2015-08-13 | Disposition: A | Discharge status: Home / Self Care | Payer: PPO | Source: Ambulatory Visit | Attending: Radiation Oncology | Admitting: Radiation Oncology

## 2015-08-13 DIAGNOSIS — Z510 Encounter for antineoplastic radiation therapy: Secondary | ICD-10-CM | POA: Diagnosis not present

## 2015-08-13 NOTE — Progress Notes (Signed)
  Oncology Nurse Navigator Documentation   Navigator Encounter Type: Treatment (08/13/15 1025) Patient Visit Type: VANVBT (08/13/15 1025)     Met with patient and his wife after his second Tomo tmt. He reported he has managed the first couple of days without any difficulty.   He did not express any needs or concerns at this time, I encouraged him to contact me if that changes, he verbalized understanding.  [name], RN, BSN, [place] at [place] [phone]                  Time Spent with Patient: 15 (08/13/15 1025)

## 2015-08-14 ENCOUNTER — Ambulatory Visit
Admission: RE | Admit: 2015-08-14 | Discharge: 2015-08-14 | Disposition: A | Discharge status: Home / Self Care | Payer: PPO | Source: Ambulatory Visit | Attending: Radiation Oncology | Admitting: Radiation Oncology

## 2015-08-14 DIAGNOSIS — Z510 Encounter for antineoplastic radiation therapy: Secondary | ICD-10-CM | POA: Diagnosis not present

## 2015-08-15 ENCOUNTER — Ambulatory Visit
Admission: RE | Admit: 2015-08-15 | Discharge: 2015-08-15 | Disposition: A | Discharge status: Home / Self Care | Payer: PPO | Source: Ambulatory Visit | Attending: Radiation Oncology | Admitting: Radiation Oncology

## 2015-08-15 DIAGNOSIS — Z510 Encounter for antineoplastic radiation therapy: Secondary | ICD-10-CM | POA: Diagnosis not present

## 2015-08-16 ENCOUNTER — Ambulatory Visit
Admission: RE | Admit: 2015-08-16 | Discharge: 2015-08-16 | Disposition: A | Discharge status: Home / Self Care | Payer: PPO | Source: Ambulatory Visit | Attending: Radiation Oncology | Admitting: Radiation Oncology

## 2015-08-16 DIAGNOSIS — Z510 Encounter for antineoplastic radiation therapy: Secondary | ICD-10-CM | POA: Diagnosis not present

## 2015-08-18 ENCOUNTER — Ambulatory Visit
Admission: RE | Admit: 2015-08-18 | Discharge: 2015-08-18 | Disposition: A | Discharge status: Home / Self Care | Payer: PPO | Source: Ambulatory Visit | Attending: Radiation Oncology | Admitting: Radiation Oncology

## 2015-08-18 DIAGNOSIS — Z510 Encounter for antineoplastic radiation therapy: Secondary | ICD-10-CM | POA: Diagnosis not present

## 2015-08-19 ENCOUNTER — Encounter: Payer: Self-pay | Admitting: Radiation Oncology

## 2015-08-19 ENCOUNTER — Ambulatory Visit: Payer: PPO

## 2015-08-19 ENCOUNTER — Ambulatory Visit
Admission: RE | Admit: 2015-08-19 | Discharge: 2015-08-19 | Disposition: A | Discharge status: Home / Self Care | Payer: PPO | Source: Ambulatory Visit | Attending: Radiation Oncology | Admitting: Radiation Oncology

## 2015-08-19 VITALS — BP 136/65 | HR 72 | Temp 97.6°F | Ht 66.0 in | Wt 145.8 lb

## 2015-08-19 DIAGNOSIS — Z510 Encounter for antineoplastic radiation therapy: Secondary | ICD-10-CM | POA: Diagnosis not present

## 2015-08-19 DIAGNOSIS — C320 Malignant neoplasm of glottis: Secondary | ICD-10-CM

## 2015-08-19 MED ORDER — LIDOCAINE VISCOUS 2 % MT SOLN: OROMUCOSAL | Status: AC

## 2015-08-19 NOTE — Progress Notes (Signed)
   Weekly Management Note:  Outpatient    ICD-9-CM ICD-10-CM   1. Carcinoma, glottis (HCC) 161.0 C32.0 lidocaine (XYLOCAINE) 2 % solution    Current Dose:  14 Gy  Projected Dose: 70 Gy   Narrative:  The patient presents for routine under treatment assessment.  CBCT/MVCT images/Port film x-rays were reviewed.  The chart was checked. Doing well, no significant pain. Eating well.  Physical Findings:  Wt Readings from Last 3 Encounters:  08/19/15 145 lb 12.8 oz (66.134 kg)  08/12/15 144 lb 8 oz (65.545 kg)  07/03/15 143 lb 1.6 oz (64.91 kg)    height is 5\' 6"  (1.676 m) and weight is 145 lb 12.8 oz (66.134 kg). His temperature is 97.6 F (36.4 C). His blood pressure is 136/65 and his pulse is 72.  NAD, skin intact over neck, no erythema; oral cavity moist/clear  CBC    Component Value Date/Time   WBC 5.4 06/26/2015 0805   WBC 3.9 06/05/2015 1145   RBC 2.29* 06/26/2015 0805   RBC 2.83* 06/05/2015 1145   HGB 8.4* 06/26/2015 0805   HGB 10.5* 06/05/2015 1145   HCT 24.8* 06/26/2015 0805   HCT 30.6* 06/05/2015 1145   PLT 187 06/26/2015 0805   PLT 234 06/05/2015 1145   MCV 108.0* 06/26/2015 0805   MCV 108.0* 06/05/2015 1145   MCH 36.8* 06/26/2015 0805   MCH 36.9* 06/05/2015 1145   MCHC 34.1 06/26/2015 0805   MCHC 34.2 06/05/2015 1145   RDW 15.3* 06/26/2015 0805   RDW 16.0* 06/05/2015 1145   LYMPHSABS 0.4* 06/26/2015 0805   LYMPHSABS 1.5 06/05/2015 1145   MONOABS 0.3 06/26/2015 0805   MONOABS 0.4 06/05/2015 1145   EOSABS 0.0 06/26/2015 0805   EOSABS 0.1 06/05/2015 1145   BASOSABS 0.0 06/26/2015 0805   BASOSABS 0.0 06/05/2015 1145     CMP     Component Value Date/Time   NA 139 06/26/2015 0805   NA 138 06/05/2015 1145   K 4.3 06/26/2015 0805   K 3.9 06/05/2015 1145   CL 100* 06/05/2015 1145   CO2 25 06/26/2015 0805   CO2 31 06/05/2015 1145   GLUCOSE 107 06/26/2015 0805   GLUCOSE 104* 06/05/2015 1145   BUN 25.3 06/26/2015 0805   BUN 19 06/05/2015 1145   CREATININE  1.3 06/26/2015 0805   CREATININE 0.98 06/05/2015 1145   CALCIUM 9.9 06/26/2015 0805   CALCIUM 9.3 06/05/2015 1145   PROT 7.5 06/26/2015 0805   PROT 7.9 06/05/2015 1145   ALBUMIN 2.5* 06/26/2015 0805   ALBUMIN 3.4* 06/05/2015 1145   AST 12 06/26/2015 0805   AST 15 06/05/2015 1145   ALT 12 06/26/2015 0805   ALT 14* 06/05/2015 1145   ALKPHOS 48 06/26/2015 0805   ALKPHOS 67 06/05/2015 1145   BILITOT 0.31 06/26/2015 0805   BILITOT 0.5 06/05/2015 1145   GFRNONAA >60 06/05/2015 1145   GFRAA >60 06/05/2015 1145     Impression:  The patient is tolerating radiotherapy.   Plan:  Continue radiotherapy as planned. Lidocaine Rx in case odynophagia develops. -----------------------------------  Eppie Gibson, MD

## 2015-08-19 NOTE — Progress Notes (Addendum)
Mr. Eric [name] is here for his 7th fraction of radiation to his Larynx. He admits to some fatigue, and states he has trouble sleeping at times. He denies modifying his diet and states "I am eating the same foods I have been eating all my life". He denies any throat pain but does admit to his throat feeling "tight" sometimes when he swallows. His voice is hoarse and he reports it will get worse as the day goes on. His mouth is normal appearing and he denies thickened saliva at this time. He reports feeling short of breath with exertion. His oxygen saturation today is 100% on Room Air.   Wt Readings from Last 3 Encounters:  08/19/15 145 lb 12.8 oz (66.134 kg)  08/12/15 144 lb 8 oz (65.545 kg)  07/03/15 143 lb 1.6 oz (64.91 kg)   BP 136/65 mmHg  Pulse 72  Temp(Src) 97.6 F (36.4 C)  Ht 5\' 6"  (1.676 m)  Wt 145 lb 12.8 oz (66.134 kg)  BMI 23.54 kg/m2  Orthostatics: Sitting BP 136/65, pulse 72 and standing BP 141/54, pulse 67.

## 2015-08-20 ENCOUNTER — Ambulatory Visit
Admission: RE | Admit: 2015-08-20 | Discharge: 2015-08-20 | Disposition: A | Discharge status: Home / Self Care | Payer: PPO | Source: Ambulatory Visit | Attending: Radiation Oncology | Admitting: Radiation Oncology

## 2015-08-20 ENCOUNTER — Encounter: Payer: PPO | Admitting: Nutrition

## 2015-08-20 DIAGNOSIS — Z510 Encounter for antineoplastic radiation therapy: Secondary | ICD-10-CM | POA: Diagnosis not present

## 2015-08-21 ENCOUNTER — Ambulatory Visit
Admission: RE | Admit: 2015-08-21 | Discharge: 2015-08-21 | Disposition: A | Discharge status: Home / Self Care | Payer: PPO | Source: Ambulatory Visit | Attending: Radiation Oncology | Admitting: Radiation Oncology

## 2015-08-21 DIAGNOSIS — Z510 Encounter for antineoplastic radiation therapy: Secondary | ICD-10-CM | POA: Diagnosis not present

## 2015-08-24 ENCOUNTER — Emergency Department (HOSPITAL_COMMUNITY): Payer: PPO

## 2015-08-24 ENCOUNTER — Emergency Department (HOSPITAL_COMMUNITY)
Admission: EM | Admit: 2015-08-24 | Discharge: 2015-08-24 | Disposition: A | Discharge status: Home / Self Care | Payer: PPO | Attending: Emergency Medicine | Admitting: Emergency Medicine

## 2015-08-24 ENCOUNTER — Encounter (HOSPITAL_COMMUNITY): Payer: Self-pay

## 2015-08-24 DIAGNOSIS — Z7982 Long term (current) use of aspirin: Secondary | ICD-10-CM | POA: Insufficient documentation

## 2015-08-24 DIAGNOSIS — M25511 Pain in right shoulder: Secondary | ICD-10-CM | POA: Diagnosis not present

## 2015-08-24 DIAGNOSIS — Z85038 Personal history of other malignant neoplasm of large intestine: Secondary | ICD-10-CM | POA: Diagnosis not present

## 2015-08-24 DIAGNOSIS — Z9849 Cataract extraction status, unspecified eye: Secondary | ICD-10-CM | POA: Diagnosis not present

## 2015-08-24 DIAGNOSIS — I2510 Atherosclerotic heart disease of native coronary artery without angina pectoris: Secondary | ICD-10-CM | POA: Insufficient documentation

## 2015-08-24 DIAGNOSIS — M79601 Pain in right arm: Secondary | ICD-10-CM | POA: Diagnosis present

## 2015-08-24 DIAGNOSIS — Z8639 Personal history of other endocrine, nutritional and metabolic disease: Secondary | ICD-10-CM | POA: Insufficient documentation

## 2015-08-24 DIAGNOSIS — Z85828 Personal history of other malignant neoplasm of skin: Secondary | ICD-10-CM | POA: Insufficient documentation

## 2015-08-24 DIAGNOSIS — M810 Age-related osteoporosis without current pathological fracture: Secondary | ICD-10-CM | POA: Diagnosis not present

## 2015-08-24 DIAGNOSIS — Z8619 Personal history of other infectious and parasitic diseases: Secondary | ICD-10-CM | POA: Insufficient documentation

## 2015-08-24 DIAGNOSIS — C329 Malignant neoplasm of larynx, unspecified: Secondary | ICD-10-CM | POA: Diagnosis not present

## 2015-08-24 DIAGNOSIS — H409 Unspecified glaucoma: Secondary | ICD-10-CM | POA: Diagnosis not present

## 2015-08-24 DIAGNOSIS — I10 Essential (primary) hypertension: Secondary | ICD-10-CM | POA: Diagnosis not present

## 2015-08-24 DIAGNOSIS — Z862 Personal history of diseases of the blood and blood-forming organs and certain disorders involving the immune mechanism: Secondary | ICD-10-CM | POA: Diagnosis not present

## 2015-08-24 DIAGNOSIS — H9190 Unspecified hearing loss, unspecified ear: Secondary | ICD-10-CM | POA: Diagnosis not present

## 2015-08-24 DIAGNOSIS — M1990 Unspecified osteoarthritis, unspecified site: Secondary | ICD-10-CM | POA: Insufficient documentation

## 2015-08-24 DIAGNOSIS — K219 Gastro-esophageal reflux disease without esophagitis: Secondary | ICD-10-CM | POA: Insufficient documentation

## 2015-08-24 DIAGNOSIS — Z8701 Personal history of pneumonia (recurrent): Secondary | ICD-10-CM | POA: Insufficient documentation

## 2015-08-24 DIAGNOSIS — Z923 Personal history of irradiation: Secondary | ICD-10-CM | POA: Diagnosis not present

## 2015-08-24 DIAGNOSIS — Z87891 Personal history of nicotine dependence: Secondary | ICD-10-CM | POA: Diagnosis not present

## 2015-08-24 DIAGNOSIS — Z79899 Other long term (current) drug therapy: Secondary | ICD-10-CM | POA: Diagnosis not present

## 2015-08-24 DIAGNOSIS — F0390 Unspecified dementia without behavioral disturbance: Secondary | ICD-10-CM | POA: Diagnosis not present

## 2015-08-24 MED ORDER — HYDROCODONE-ACETAMINOPHEN 5-325 MG PO TABS: 1.0000 | ORAL_TABLET | ORAL | Status: AC | PRN

## 2015-08-24 MED ORDER — HYDROCODONE-ACETAMINOPHEN 5-325 MG PO TABS
1.0000 | ORAL_TABLET | Freq: Once | ORAL | Status: AC
  Administered 2015-08-24: 1 via ORAL
  Filled 2015-08-24: qty 1

## 2015-08-24 NOTE — ED Provider Notes (Signed)
CSN: [id number]     Arrival date & time 08/24/15  1113 History   First MD Initiated Contact with Patient 08/24/15 1222     Chief Complaint  Patient presents with  . Arm Pain     (Consider location/radiation/quality/duration/timing/severity/associated sxs/prior Treatment) HPI  Eric [name] is a 81 y.o. male who presents for evaluation of right arm pain, which occurred spontaneously. Following initiation of radiation therapy, on glottis, 5 days ago. He is receiving radiation, for laryngeal cancer, for about 2 weeks. He denies neck pain, chest pain, back pain, weakness, dizziness, nausea or vomiting. He is using tramadol, without relief. There are no other no modifying factors.   Past Medical History  Diagnosis Date  . Peripheral vascular disease ([place])   . Transient global amnesia   . Hypercholesterolemia   . 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 Hypertension   . Macrocytic anemia   . Glaucoma   . Osteoporosis   . GERD (gastroesophageal reflux disease)   . Cholelithiasis   . Shingles 2009  . Pneumonia 02-2011    left lower lobe  . CAD (coronary artery disease)   . Colon cancer ([place])     Multiple Mylemona  . Stroke [facility])     'Mini Stroke"  . Dementia   . Skin cancer     ears  . Peripheral arterial disease ([place])   . Shortness of breath dyspnea     with exertion  . Arthritis   . Restless leg syndrome   . 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 Hearing loss    Past Surgical History  Procedure Laterality Date  . Laminectomies      multiple  . Exploratory laparotomy      treat twisted intestines  . Colectomy      to remove adenoma  . Femoral artery stenting    . Back surgery      lower back  . Endarterectomy femoral Left 05/02/2014    Procedure: LEFT FEMORAL ENDARTERECTOMY ;  Surgeon: Rosetta Posner, MD;  Location: [facility];  Service: Vascular;  Laterality: Left;  . Abdominal aortagram N/A 03/28/2014    Procedure: ABDOMINAL AORTAGRAM;  Surgeon:  Rosetta Posner, MD;  Location: [facility] CATH LAB;  Service: Cardiovascular;  Laterality: N/A;  . Eye surgery Left     cataract extraction  . Microlaryngoscopy N/A 05/28/2015    Procedure: MICROLARYNGOSCOPY/ WITH BIOPSY;  Surgeon: Beverly Gust, MD;  Location: [facility] ORS;  Service: ENT;  Laterality: N/A;   Family History  Problem Relation Age of Onset  . Diabetes Mother   . Heart disease Mother   . Hypertension Mother   . Heart disease Father   . Hypertension Father   . Diabetes Father   . Hyperlipidemia Father   . Peripheral vascular disease Father   . Cancer Brother   . Diabetes Brother   . Hypertension Brother   . Heart disease Brother     before age 1  . Cancer Sister    Social History  Substance Use Topics  . Smoking status: Former Smoker    Types: Cigarettes    Quit date: 09/28/1988  . Smokeless tobacco: Current User    Types: Chew  . Alcohol Use: No     Comment: rare    Review of Systems  All other systems reviewed and are negative.     Allergies  Review of patient's allergies indicates no known allergies.  Home Medications   Prior  to Admission medications   Medication Sig Start Date End Date Taking? Authorizing Provider  aspirin EC 81 MG tablet Take 81 mg by mouth daily.   Yes Historical Provider, MD  clonazePAM (KLONOPIN) 0.5 MG tablet Take 0.5 mg by mouth at bedtime.    Yes Historical Provider, MD  hydrochlorothiazide (HYDRODIURIL) 25 MG tablet Take 25 mg by mouth daily.   Yes Historical Provider, MD  latanoprost (XALATAN) 0.005 % ophthalmic solution Place 1 drop into both eyes at bedtime.  03/09/14  Yes Historical Provider, MD  lidocaine (XYLOCAINE) 2 % solution Patient: Mix 1part 2% viscous lidocaine, 1part H20. Swish and/or swallow 36mL of this mixture, 39min before meals and at bedtime, up to QID 08/19/15  Yes Eppie Gibson, MD  lisinopril (PRINIVIL,ZESTRIL) 5 MG tablet Take 5 mg by mouth daily.   Yes Historical Provider, MD  omeprazole (PRILOSEC OTC) 20 MG  tablet Take 20 mg by mouth at bedtime.   Yes Historical Provider, MD  Timolol Maleate PF 0.5 % SOLN Apply 1 drop to eye daily.   Yes Historical Provider, MD  traMADol (ULTRAM) 50 MG tablet Take 1 tablet (50 mg total) by mouth every 6 (six) hours as needed. 05/03/14  Yes Samantha J Rhyne, PA-C  HYDROcodone-acetaminophen (NORCO) 5-325 MG tablet Take 1 tablet by mouth every 4 (four) hours as needed. 08/24/15   Daleen Bo, MD   BP 129/62 mmHg  Pulse 72  Temp(Src) 97.8 F (36.6 C) (Oral)  Resp 14  SpO2 97% Physical Exam  Constitutional: He is oriented to person, place, and time. He appears well-developed and well-nourished.  HENT:  Head: Normocephalic and atraumatic.  Right Ear: External ear normal.  Left Ear: External ear normal.  Eyes: Conjunctivae and EOM are normal. Pupils are equal, round, and reactive to light.  Neck: Normal range of motion and phonation normal. Neck supple.  Cardiovascular: Normal rate, regular rhythm and normal heart sounds.   Pulmonary/Chest: Effort normal and breath sounds normal. He exhibits no bony tenderness.  Abdominal: Soft. There is no tenderness.  Musculoskeletal: Normal range of motion.  Mild tenderness of the right shoulder without deformity. Normal range of motion, arms and legs bilaterally. No weakness of the right rotator cuff.  Neurological: He is alert and oriented to person, place, and time. No cranial nerve deficit or sensory deficit. He exhibits normal muscle tone. Coordination normal.  Skin: Skin is warm, dry and intact.  Psychiatric: He has a normal mood and affect. His behavior is normal. Judgment and thought content normal.  Nursing note and vitals reviewed.   ED Course  Procedures (including critical care time) Medications  HYDROcodone-acetaminophen (NORCO/VICODIN) 5-325 MG per tablet 1 tablet (1 tablet Oral Given 08/24/15 1301)    Patient Vitals for the past 24 hrs:  BP Temp Temp src Pulse Resp SpO2  08/24/15 1117 129/62 mmHg 97.8 F  (36.6 C) Oral 72 14 97 %    1:32 PM Reevaluation with update and discussion. After initial assessment and treatment, an updated evaluation reveals he is anxious to go home. No further complaints. Findings discussed with patient and wife, all questions answered. [place] Review Labs Reviewed - No data to display  Imaging Review Dg Shoulder Right  08/24/2015  CLINICAL DATA:  Right shoulder pain radiating into right arm 5 days. No injury. EXAM: RIGHT SHOULDER - 2+ VIEW COMPARISON:  None. FINDINGS: Mild degenerate changes of the [facility] joint and glenohumeral joint. No acute fracture or dislocation. Degenerative changes of  the spine. IMPRESSION: No acute findings. Electronically Signed   By: Marin Olp M.D.   On: 08/24/2015 13:18   I have personally reviewed and evaluated these images and lab results as part of my medical decision-making.   EKG Interpretation None      MDM   Final diagnoses:  Shoulder pain, right    Nonspecific right shoulder pain, doubt fracture, metastatic lytic lesion, rotator cuff syndrome or significant cervical radiculopathy.  Nursing Notes Reviewed/ Care Coordinated Applicable Imaging Reviewed Interpretation of Laboratory Data incorporated into ED treatment  The patient appears reasonably screened and/or stabilized for discharge and I doubt any other medical condition or other [facility] requiring further screening, evaluation, or treatment in the ED at this time prior to discharge.  Plan: Home Medications- Norco; Home Treatments- rest, Heat; return here if the recommended treatment, does not improve the symptoms; Recommended follow up- PCP 1 week for check up     Daleen Bo, MD 08/24/15 1345

## 2015-08-24 NOTE — ED Notes (Signed)
Pt reports he has throat cancer and had radiation on Monday of this week. Pt reports that ever since the radiation, he has been having some right arm pain from his shoulder down to his elbow. Denies any injury.

## 2015-08-24 NOTE — Discharge Instructions (Signed)
Use heat on the sore area 3 or 4 times a day. Avoid lifting with the right arm.   Joint Pain Joint pain, which is also called arthralgia, can be caused by many things. Joint pain often goes away when you follow your health care provider's instructions for relieving pain at home. However, joint pain can also be caused by conditions that require further treatment. Common causes of joint pain include:  Bruising in the area of the joint.  Overuse of the joint.  Wear and tear on the joints that occur with aging (osteoarthritis).  Various other forms of arthritis.  A buildup of a crystal form of uric acid in the joint (gout).  Infections of the joint (septic arthritis) or of the bone (osteomyelitis). Your health care provider may recommend medicine to help with the pain. If your joint pain continues, additional tests may be needed to diagnose your condition. HOME CARE INSTRUCTIONS Watch your condition for any changes. Follow these instructions as directed to lessen the pain that you are feeling.  Take medicines only as directed by your health care provider.  Rest the affected area for as long as your health care provider says that you should. If directed to do so, raise the painful joint above the level of your heart while you are sitting or lying down.  Do not do things that cause or worsen pain.  If directed, apply ice to the painful area:  Put ice in a plastic bag.  Place a towel between your skin and the bag.  Leave the ice on for 20 minutes, 2-3 times per day.  Wear an elastic bandage, splint, or sling as directed by your health care provider. Loosen the elastic bandage or splint if your fingers or toes become numb and tingle, or if they turn cold and blue.  Begin exercising or stretching the affected area as directed by your health care provider. Ask your health care provider what types of exercise are safe for you.  Keep all follow-up visits as directed by your health care  provider. This is important. SEEK MEDICAL CARE IF:  Your pain increases, and medicine does not help.  Your joint pain does not improve within 3 days.  You have increased bruising or swelling.  You have a fever.  You lose 10 lb (4.5 kg) or more without trying. SEEK IMMEDIATE MEDICAL CARE IF:  You are not able to move the joint.  Your fingers or toes become numb or they turn cold and blue.   This information is not intended to replace advice given to you by your health care provider. Make sure you discuss any questions you have with your health care provider.   Document Released: 09/14/2005 Document Revised: 10/05/2014 Document Reviewed: 06/26/2014 Elsevier Interactive Patient Education [employer].

## 2015-08-26 ENCOUNTER — Encounter: Payer: Self-pay | Admitting: Radiation Oncology

## 2015-08-26 ENCOUNTER — Ambulatory Visit
Admission: RE | Admit: 2015-08-26 | Discharge: 2015-08-26 | Disposition: A | Discharge status: Home / Self Care | Payer: PPO | Source: Ambulatory Visit | Attending: Radiation Oncology | Admitting: Radiation Oncology

## 2015-08-26 ENCOUNTER — Ambulatory Visit: Payer: PPO

## 2015-08-26 ENCOUNTER — Telehealth: Payer: Self-pay

## 2015-08-26 ENCOUNTER — Ambulatory Visit: Payer: PPO | Attending: Radiation Oncology

## 2015-08-26 VITALS — BP 148/53 | HR 72 | Temp 97.9°F | Ht 66.0 in | Wt 144.4 lb

## 2015-08-26 DIAGNOSIS — C320 Malignant neoplasm of glottis: Secondary | ICD-10-CM

## 2015-08-26 DIAGNOSIS — Z510 Encounter for antineoplastic radiation therapy: Secondary | ICD-10-CM | POA: Diagnosis not present

## 2015-08-26 DIAGNOSIS — R1310 Dysphagia, unspecified: Secondary | ICD-10-CM | POA: Diagnosis not present

## 2015-08-26 NOTE — Progress Notes (Signed)
Weekly Management Note:  Outpatient    ICD-9-CM ICD-10-CM   1. 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  CBCT/MVCT images/Port film x-rays were reviewed.  The chart was checked. Mild throat pain, helped by lidocaine. Main issue - on Thanksgiving he developed upper arm pain, R>L.  No clear causes per ED evaluation.  Cannot tolerated hydrocodone  (N/V).  Tramadol doesn't help.  Physical Findings:  Wt Readings from Last 3 Encounters:  08/26/15 144 lb 6.4 oz (65.499 kg)  08/19/15 145 lb 12.8 oz (66.134 kg)  08/12/15 144 lb 8 oz (65.545 kg)    height is 5\' 6"  (1.676 m) and weight is 144 lb 6.4 oz (65.499 kg). His temperature is 97.9 F (36.6 C). His blood pressure is 148/53 and his pulse is 72.  NAD, skin intact over neck, no erythema; oral cavity moist/clear.  Good Range of motion in arms. No UE edema.  Tender in upper arms to palpation. Good neck mobility, no pain with  turning head side to side  CBC    Component Value Date/Time   WBC 5.4 06/26/2015 0805   WBC 3.9 06/05/2015 1145   RBC 2.29* 06/26/2015 0805   RBC 2.83* 06/05/2015 1145   HGB 8.4* 06/26/2015 0805   HGB 10.5* 06/05/2015 1145   HCT 24.8* 06/26/2015 0805   HCT 30.6* 06/05/2015 1145   PLT 187 06/26/2015 0805   PLT 234 06/05/2015 1145   MCV 108.0* 06/26/2015 0805   MCV 108.0* 06/05/2015 1145   MCH 36.8* 06/26/2015 0805   MCH 36.9* 06/05/2015 1145   MCHC 34.1 06/26/2015 0805   MCHC 34.2 06/05/2015 1145   RDW 15.3* 06/26/2015 0805   RDW 16.0* 06/05/2015 1145   LYMPHSABS 0.4* 06/26/2015 0805   LYMPHSABS 1.5 06/05/2015 1145   MONOABS 0.3 06/26/2015 0805   MONOABS 0.4 06/05/2015 1145   EOSABS 0.0 06/26/2015 0805   EOSABS 0.1 06/05/2015 1145   BASOSABS 0.0 06/26/2015 0805   BASOSABS 0.0 06/05/2015 1145     CMP     Component Value Date/Time   NA 139 06/26/2015 0805   NA 138 06/05/2015 1145     K 4.3 06/26/2015 0805   K 3.9 06/05/2015 1145   CL 100* 06/05/2015 1145   CO2 25 06/26/2015 0805   CO2 31 06/05/2015 1145   GLUCOSE 107 06/26/2015 0805   GLUCOSE 104* 06/05/2015 1145   BUN 25.3 06/26/2015 0805   BUN 19 06/05/2015 1145   CREATININE 1.3 06/26/2015 0805   CREATININE 0.98 06/05/2015 1145   CALCIUM 9.9 06/26/2015 0805   CALCIUM 9.3 06/05/2015 1145   PROT 7.5 06/26/2015 0805   PROT 7.9 06/05/2015 1145   ALBUMIN 2.5* 06/26/2015 0805   ALBUMIN 3.4* 06/05/2015 1145   AST 12 06/26/2015 0805   AST 15 06/05/2015 1145   ALT 12 06/26/2015 0805   ALT 14* 06/05/2015 1145   ALKPHOS 48 06/26/2015 0805   ALKPHOS 67 06/05/2015 1145   BILITOT 0.31 06/26/2015 0805   BILITOT 0.5 06/05/2015 1145   GFRNONAA >60 06/05/2015 1145   GFRAA >60 06/05/2015 1145     Impression:  The patient is tolerating radiotherapy.   Plan:  Continue radiotherapy as planned. Lidocaine Rx for throat.  Aleve for arm pain; not consistent with malignant cause or DVT.  Will see PCP this week for more evaluation. -----------------------------------  Eppie Gibson,  MD

## 2015-08-26 NOTE — Therapy (Signed)
[facility] [address] [place], [place], [address] Phone: [phone]   Fax:  [phone]  Speech Language Pathology Evaluation  Patient Details  Name: Eric [name] MRN: [id number] Date of Birth: December 23, 1929 Referring Provider: Eppie Gibson, M.D.  Encounter Date: 08/26/2015      End of Session - 08/26/15 1654    Visit Number 1   Number of Visits 3   Date for SLP Re-Evaluation 10/25/15   SLP Start Time 6222   SLP Stop Time  1528   SLP Time Calculation (min) 43 min   Activity Tolerance Patient tolerated treatment well      Past Medical History  Diagnosis Date  . Peripheral vascular disease ([place])   . Transient global amnesia   . Hypercholesterolemia   . 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 Hypertension   . Macrocytic anemia   . Glaucoma   . Osteoporosis   . GERD (gastroesophageal reflux disease)   . Cholelithiasis   . Shingles 2009  . Pneumonia 02-2011    left lower lobe  . CAD (coronary artery disease)   . Colon cancer ([place])     Multiple Mylemona  . Stroke [facility])     'Mini Stroke"  . Dementia   . Skin cancer     ears  . Peripheral arterial disease ([place])   . Shortness of breath dyspnea     with exertion  . Arthritis   . Restless leg syndrome   . 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 Hearing loss     Past Surgical History  Procedure Laterality Date  . Laminectomies      multiple  . Exploratory laparotomy      treat twisted intestines  . Colectomy      to remove adenoma  . Femoral artery stenting    . Back surgery      lower back  . Endarterectomy femoral Left 05/02/2014    Procedure: LEFT FEMORAL ENDARTERECTOMY ;  Surgeon: Rosetta Posner, MD;  Location: [facility];  Service: Vascular;  Laterality: Left;  . Abdominal aortagram N/A 03/28/2014    Procedure: ABDOMINAL AORTAGRAM;  Surgeon: Rosetta Posner, MD;  Location: [facility] CATH LAB;  Service: Cardiovascular;  Laterality:  N/A;  . Eye surgery Left     cataract extraction  . Microlaryngoscopy N/A 05/28/2015    Procedure: MICROLARYNGOSCOPY/ WITH BIOPSY;  Surgeon: Beverly Gust, MD;  Location: [facility] ORS;  Service: ENT;  Laterality: N/A;    There were no vitals filed for this visit.  Visit Diagnosis: Dysphagia      Subjective Assessment - 08/26/15 1453    Subjective Pt arrives with mod hoarse voice, denies any difficulty with meals. "You cough some at night" - pt's wife            SLP Evaluation [facility] - 08/26/15 1454    SLP Visit Information   SLP Received On 08/26/15   Referring Provider Eppie Gibson, M.D.   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 Thyroid cartilage involvement questionable after PET. Dr. [name] Moos is treating the tumor and bil neck, began 08-12-15. No  plans for PEG at this time.   Prior Functional Status   Cognitive/Linguistic Baseline Baseline deficits    Lives With Spouse   Vocation Retired   Associate Professor   Overall Cognitive Status History of cognitive impairments - at baseline   Memory Impaired   Memory Impairment Decreased short term memory   Auditory Comprehension   Overall Auditory Comprehension Appears within functional limits for tasks assessed   Verbal Expression   Overall Verbal Expression Appears within functional limits for tasks assessed   Oral Motor/Sensory Function   Overall Oral Motor/Sensory Function --   Facial ROM --  Labial ROM appears WFL   Labial Coordination WFL   Lingual Coordination WFL   Facial Coordination WFL   Motor Speech   Overall Motor Speech Appears within functional limits for tasks assessed   Phonation Hoarse  moderate       Pt currently tolerates regular diet with thin liquids with reported minimal coughing during mealtimes.  Pt with oral motor assessment as above. Cough/throat clear were WNL, and pt with multiple fillings but with adequate dentition for mastication. POs: Pt coughed once during liquid swallows with solid bolus remaining in oral cavity. SLP suggested pt thoroughly chew solids prior to ingesting liquid POs and provided a reminder card to put at his place at the table.  Thyroid elevation appeared WFL, and swallows appeared timely. Oral residue noted as minimal.  Because data states the risk for dysphagia during and after radiation treatment is high due to undergoing radiation tx, SLP taught pt about the possibility of reduced/limited ability for PO intake during rad tx. SLP encouraged pt to continue swallowing POs as far into rad tx as possible, even ingesting POs and/or completing HEP shortly after administration of pain meds.  SLP educated pt re: changes to swallowing musculature after rad tx, and why adherence to dysphagia HEP provided today and PO consumption was necessary to inhibit muscular disuse atrophy and to reduce muscle fibrosis following rad tx. Pt demonstrated understanding of these things to SLP. Further education was provided re: xerostomia, mucositis/esophagitis, and late effects head/neck radiation.   After evaluation tasks today, this SLP then developed a HEP for pt based upon his radiation plan. Pt was instructed how to perform exercises involving lingual, vocal, and pharyngeal strengthening. SLP performed each exercise and pt return demonstrated each exercise. SLP ensured pt performance was correct prior to moving on to next exercise. Pt was instructed to complete this program 2-3 times a day, 6-7 days/week until 60 days after his last rad tx, then x2-3 a week after that. Pt will require follow up for adherence to this HEP as well as for assessing safety with POs during and after rad  tx.                    SLP Education - 08/26/15 1654    Education provided Yes   Education Details HEP, late effects head/neck radiation (muscle fibrosis)   Person(s) Educated Patient;Spouse   Methods Explanation;Demonstration;Verbal cues   Comprehension Verbalized understanding;Returned demonstration;Verbal cues required;Need further instruction          SLP Short Term Goals - 08/26/15 1657    SLP SHORT TERM GOAL #1   Title pt will complete HEP with occasional min A   Time 2   Period --  visits   Status New   SLP SHORT TERM GOAL #2   Title pt will tell SLP why he is completing HEP, given min A   Time  2   Period --  visits   Status New   SLP SHORT TERM GOAL #3   Title pt will demo recall of need to chew and swallow solid POs prior to ingesting liquid bolus, with direct questioning   Time 2   Period --  visits   Status New          SLP Long Term Goals - 08-Sep-2015 1658    SLP LONG TERM GOAL #1   Title pt will complete HEP with rare min A   Time 3   Period --  visits   Status New   SLP LONG TERM GOAL #2   Title pt will tell SLP three signs/symptoms aspiration PNA   Time 3   Period --  visits   Status New          Plan - 2015-09-08 1655    Clinical Impression Statement Pt presents with swallowing essentially WNL, except for cough once with liquids with solid bolus remaining in oral cavity. SLP suggested pt chew and swallow solid prior to liquid bolus ingestion and wrote reminder for him to place a his table place. Pt req'uires skilled ST to assess cont'd safey of POs during and after rad tx, and to assess proper completion of HEP.   Speech Therapy Frequency --  approx every four weeks   Duration --  for 60 days   Treatment/Interventions Pharyngeal strengthening exercises;SLP instruction and feedback;Compensatory strategies;Patient/family education;Diet toleration management by SLP   Potential to Achieve Goals Good   Potential Considerations  Ability to learn/carryover information          G-Codes - 08-Sep-2015 1700    Functional Limitations Swallowing   Swallow Current Status (S1388) At least 1 percent but less than 20 percent impaired, limited or restricted   Swallow Goal Status (T1959) At least 1 percent but less than 20 percent impaired, limited or restricted      Problem List Patient Active Problem List   Diagnosis Date Noted  . DNR (do not resuscitate) discussion 07/28/2015  . Palliative care encounter 07/28/2015  . Cancer of glottis ([place])   . 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 Back pain 10/10/2014  . PAD (peripheral artery disease) ([place]) 05/02/2014  . Multiple myeloma ([place]) 04/04/2014  . MGUS (monoclonal gammopathy of unknown significance) 03/14/2014  . Atherosclerosis of native arteries of the extremities with intermittent claudication 03/13/2014    [facility] , MS, CCC-SLP  2015-09-08, 5:00 PM  [facility] [address] [place], [place], [address] Phone: [phone]   Fax:  [phone]  Name: Eric [name] MRN: [id number] Date of Birth: 18-Jul-1930

## 2015-08-26 NOTE — Progress Notes (Addendum)
Mr. Eric [name] received 9 fractions to his Larynx and denies any pain nor difficulty swallowing.   His main concern today is bilateral upper extremity pain for which he went to the ED for assessment on 08/24/15.  As stated per Dr. Eulis [name] he had Mild tenderness of the right shoulder without deformity. Normal range of motion, arms and legs bilaterally. No weakness of the right rotator cuff.  Eric [name] continues to have pain today and reports minimal relief with Hydrocodone 1 po Q 4 hours and developed an intolerance resulting in nausea and vomiting.  Taking Tramadol presently, still, with minimal relief and grades pain as a level 7/10.  Eric [name] called his PCP Dr. Felipa [name] for reassessment but no response yet.

## 2015-08-26 NOTE — Patient Instructions (Signed)
SWALLOWING EXERCISES Do these 6 of the 7 days per week until 6 months after your last day of radiation, then 2-3 times per week afterwards  1. Effortful Swallows  - Press your tongue against the roof of your mouth, then squeeze hard with the muscles in your neck while you swallow your  saliva or a sip of water - Repeat 15-20 times, 2-3 times a day, and use whenever you eat or drink  2. Masako Swallow - swallow with your tongue sticking out - Stick tongue out past your teeth and gently bite tongue with your teeth - Swallow, while holding your tongue with your teeth - Repeat 15-20 times, 2-3 times a day *use a wet spoon if your mouth gets dry*  3. Mendelsohn Maneuver - "half swallow" exercise - Start to swallow, and keep your Adam's apple up by squeezing hard with the muscles of the throat - Hold the squeeze for 5-7 seconds and then relax - Repeat 15 times, 2-3 times a day *use a wet spoon if your mouth gets dry*  4. Breath Hold - Say "HUH!" loudly, then hold your breath for 3 seconds at your voice box - Repeat 20 times, 2-3 times a day  5. Chin pushback - Open your mouth  - Place your fist UNDER your chin near your neck, and push back with your fist for 5 seconds - Repeat 10 times, 2-3 times a day

## 2015-08-26 NOTE — Telephone Encounter (Signed)
I called Mrs. Cervone per her request about pain her husband has been having in his Right arm. She states it is severe, and he has been pacing around the house due to the pain. He was seen in the ED this weekend for this pain, but was discharged. He is taking pain medicine with some relief. They deny redness, or swelling to his arm at this time. His treatment time is scheduled for 3:40, I encouraged them to come earlier to see Dr. [name] Moos before his treatment today to evaluate him.

## 2015-08-27 ENCOUNTER — Ambulatory Visit
Admission: RE | Admit: 2015-08-27 | Discharge: 2015-08-27 | Disposition: A | Discharge status: Home / Self Care | Payer: PPO | Source: Ambulatory Visit | Attending: Radiation Oncology | Admitting: Radiation Oncology

## 2015-08-27 ENCOUNTER — Encounter: Payer: Self-pay | Admitting: *Deleted

## 2015-08-27 DIAGNOSIS — Z510 Encounter for antineoplastic radiation therapy: Secondary | ICD-10-CM | POA: Diagnosis not present

## 2015-08-27 NOTE — Progress Notes (Signed)
  Oncology Nurse Navigator Documentation   Navigator Encounter Type: Treatment (08/27/15 1020) Patient Visit Type: Radonc;Follow-up (08/27/15 1020)   Barriers/Navigation Needs: Education (08/27/15 1020)     Met with patient prior to his Tomo tmt.  He was accompanied by his wife. He reported:  Denied difficulty with XRT, sore throat or other SEs.  Applying Sonafine daily after AM shower.   I encouraged an additional application at bedtime to help maintain skin integrity.  He understands not to apply 4 hrs prior to XRT.  R arm/shoulder pain which has been interfering with sleep.  He is seeing his PCP for this issue. He did not express any needs or concerns at this time, I encouraged him to contact me if that changes, he verbalized understanding.  [name], RN, BSN, [place] at [place] [phone]               Time Spent with Patient: 30 (08/27/15 1020)

## 2015-08-28 ENCOUNTER — Telehealth: Payer: Self-pay | Admitting: Internal Medicine

## 2015-08-28 ENCOUNTER — Ambulatory Visit
Admission: RE | Admit: 2015-08-28 | Discharge: 2015-08-28 | Disposition: A | Discharge status: Home / Self Care | Payer: PPO | Source: Ambulatory Visit | Attending: Radiation Oncology | Admitting: Radiation Oncology

## 2015-08-28 ENCOUNTER — Other Ambulatory Visit (HOSPITAL_BASED_OUTPATIENT_CLINIC_OR_DEPARTMENT_OTHER): Payer: PPO

## 2015-08-28 ENCOUNTER — Ambulatory Visit (HOSPITAL_BASED_OUTPATIENT_CLINIC_OR_DEPARTMENT_OTHER): Payer: PPO | Admitting: Internal Medicine

## 2015-08-28 ENCOUNTER — Encounter: Payer: Self-pay | Admitting: Internal Medicine

## 2015-08-28 VITALS — BP 127/55 | HR 79 | Temp 97.9°F | Resp 18 | Ht 66.0 in | Wt 144.2 lb

## 2015-08-28 DIAGNOSIS — Z510 Encounter for antineoplastic radiation therapy: Secondary | ICD-10-CM | POA: Diagnosis not present

## 2015-08-28 DIAGNOSIS — M25519 Pain in unspecified shoulder: Secondary | ICD-10-CM | POA: Diagnosis not present

## 2015-08-28 DIAGNOSIS — C329 Malignant neoplasm of larynx, unspecified: Secondary | ICD-10-CM | POA: Diagnosis not present

## 2015-08-28 DIAGNOSIS — G4700 Insomnia, unspecified: Secondary | ICD-10-CM | POA: Diagnosis not present

## 2015-08-28 DIAGNOSIS — C9000 Multiple myeloma not having achieved remission: Secondary | ICD-10-CM

## 2015-08-28 DIAGNOSIS — R5383 Other fatigue: Secondary | ICD-10-CM

## 2015-08-28 DIAGNOSIS — C320 Malignant neoplasm of glottis: Secondary | ICD-10-CM

## 2015-08-28 LAB — COMPREHENSIVE METABOLIC PANEL (CC13)
ALK PHOS: 63 U/L (ref 40–150)
ALT: 10 U/L (ref 0–55)
AST: 16 U/L (ref 5–34)
Albumin: 2.8 g/dL — ABNORMAL LOW (ref 3.5–5.0)
Anion Gap: 12 mEq/L — ABNORMAL HIGH (ref 3–11)
BUN: 16.6 mg/dL (ref 7.0–26.0)
CO2: 28 meq/L (ref 22–29)
Calcium: 11 mg/dL — ABNORMAL HIGH (ref 8.4–10.4)
Chloride: 100 mEq/L (ref 98–109)
Creatinine: 1.1 mg/dL (ref 0.7–1.3)
EGFR: 64 mL/min/{1.73_m2} — AB (ref >90)
GLUCOSE: 111 mg/dL (ref 70–140)
POTASSIUM: 4.3 meq/L (ref 3.5–5.1)
SODIUM: 141 meq/L (ref 136–145)
Total Bilirubin: <0.3 mg/dL (ref 0.20–1.20)
Total Protein: 8.4 g/dL — ABNORMAL HIGH (ref 6.4–8.3)

## 2015-08-28 LAB — CBC WITH DIFFERENTIAL/PLATELET
BASO%: 0 % (ref 0.0–2.0)
BASOS ABS: 0 10*3/uL (ref 0.0–0.1)
EOS ABS: 0.1 10*3/uL (ref 0.0–0.5)
EOS%: 2.2 % (ref 0.0–7.0)
HCT: 27.7 % — ABNORMAL LOW (ref 38.4–49.9)
HGB: 9.1 g/dL — ABNORMAL LOW (ref 13.0–17.1)
LYMPH%: 22.2 % (ref 14.0–49.0)
MCH: 36 pg — AB (ref 27.2–33.4)
MCHC: 32.9 g/dL (ref 32.0–36.0)
MCV: 109.5 fL — AB (ref 79.3–98.0)
MONO#: 0.3 10*3/uL (ref 0.1–0.9)
MONO%: 8.6 % (ref 0.0–14.0)
NEUT#: 2.2 10*3/uL (ref 1.5–6.5)
NEUT%: 67 % (ref 39.0–75.0)
Platelets: 153 10*3/uL (ref 140–400)
RBC: 2.53 10*6/uL — AB (ref 4.20–5.82)
RDW: 15.1 % — ABNORMAL HIGH (ref 11.0–14.6)
WBC: 3.3 10*3/uL — AB (ref 4.0–10.3)
lymph#: 0.7 10*3/uL — ABNORMAL LOW (ref 0.9–3.3)

## 2015-08-28 LAB — TSH CHCC: TSH: 1.868 m(IU)/L (ref 0.320–4.118)

## 2015-08-28 NOTE — Addendum Note (Signed)
Addended by: Lucile Crater on: 08/28/2015 10:01 AM   Modules accepted: Medications

## 2015-08-28 NOTE — Progress Notes (Signed)
[place] Telephone:([phone]   [phone]  OFFICE PROGRESS NOTE  Mathews Argyle, MD 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 2) Multiple myeloma diagnosed in June of 2015  PRIOR THERAPY: None  CURRENT THERAPY: Curative radiotherapy to the laryngeal squamous cell carcinoma under the care of Dr. [name] Moos.  INTERVAL HISTORY: Eric [name] 81 y.o. male returns to the clinic today for followup visit accompanied by his wife and daughter. The patient is feeling fine today except for aching pain in the shoulder and arms started 2 weeks ago. He was seen at the emergency department and x-ray of the right shoulder showed no acute findings. The patient is currently undergoing curative radiotherapy to the laryngeal squamous cell carcinoma and expected to complete this course in January 2017. He has some insomnia. He denied having any significant nausea but has one episode earlier today. No significant weight loss or night sweats. He has no chest pain, shortness breath, cough or hemoptysis. His previous myeloma panel performed in September 2016 showed evidence for disease progression. The patient is here today for evaluation and discussion of his treatment options.  MEDICAL HISTORY: Past Medical History  Diagnosis Date  . Peripheral vascular disease ([place])   . Transient global amnesia   . Hypercholesterolemia   . 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 Hypertension   . Macrocytic anemia   . Glaucoma   . Osteoporosis   . GERD (gastroesophageal reflux disease)   . Cholelithiasis   . Shingles 2009  . Pneumonia 02-2011    left lower lobe  . CAD (coronary artery disease)   . Colon cancer ([place])     Multiple Mylemona  . Stroke [facility])     'Mini Stroke"  . Dementia   . Skin cancer     ears  . Peripheral arterial disease ([place])   . Shortness of breath  dyspnea     with exertion  . Arthritis   . Restless leg syndrome   . Squamous cell carcinoma of larynx ([place]) 06/05/2015  . Hearing loss     ALLERGIES:  has No Known Allergies.  MEDICATIONS:  Current Outpatient Prescriptions  Medication Sig Dispense Refill  . aspirin EC 81 MG tablet Take 81 mg by mouth daily.    . clonazePAM (KLONOPIN) 0.5 MG tablet Take 0.5 mg by mouth at bedtime.     . hydrochlorothiazide (HYDRODIURIL) 25 MG tablet Take 25 mg by mouth daily.    Marland Kitchen HYDROcodone-acetaminophen (NORCO) 5-325 MG tablet Take 1 tablet by mouth every 4 (four) hours as needed. 20 tablet 0  . latanoprost (XALATAN) 0.005 % ophthalmic solution Place 1 drop into both eyes at bedtime.     . lidocaine (XYLOCAINE) 2 % solution Patient: Mix 1part 2% viscous lidocaine, 1part H20. Swish and/or swallow 70m of this mixture, 327m before meals and at bedtime, up to QID 100 mL 5  . lisinopril (PRINIVIL,ZESTRIL) 5 MG tablet Take 5 mg by mouth daily.    . Marland Kitchenmeprazole (PRILOSEC OTC) 20 MG tablet Take 20 mg by mouth at bedtime.    . Timolol Maleate PF 0.5 % SOLN Apply 1 drop to eye daily.    . traMADol (ULTRAM) 50 MG tablet Take 1 tablet (50 mg total) by mouth every 6 (six) hours as needed. 20 tablet 0   No current facility-administered medications for this visit.  SURGICAL HISTORY:  Past Surgical History  Procedure Laterality Date  . Laminectomies      multiple  . Exploratory laparotomy      treat twisted intestines  . Colectomy      to remove adenoma  . Femoral artery stenting    . Back surgery      lower back  . Endarterectomy femoral Left 05/02/2014    Procedure: LEFT FEMORAL ENDARTERECTOMY ;  Surgeon: Rosetta Posner, MD;  Location: [place] [place];  Service: Vascular;  Laterality: Left;  . Abdominal aortagram N/A 03/28/2014    Procedure: ABDOMINAL AORTAGRAM;  Surgeon: Rosetta Posner, MD;  Location: [facility] CATH LAB;  Service: Cardiovascular;  Laterality: N/A;  . Eye surgery Left     cataract extraction  .  Microlaryngoscopy N/A 05/28/2015    Procedure: MICROLARYNGOSCOPY/ WITH BIOPSY;  Surgeon: Beverly Gust, MD;  Location: [facility] ORS;  Service: ENT;  Laterality: N/A;    REVIEW OF SYSTEMS:  Constitutional: negative Eyes: negative Ears, nose, mouth, throat, and face: positive for hoarseness Respiratory: negative Cardiovascular: negative Gastrointestinal: negative Genitourinary:negative Integument/breast: negative Hematologic/lymphatic: negative Musculoskeletal:negative Neurological: negative Behavioral/Psych: negative Endocrine: negative Allergic/Immunologic: negative   PHYSICAL EXAMINATION: General appearance: alert, cooperative and no distress Head: Normocephalic, without obvious abnormality, atraumatic Neck: no adenopathy, no JVD, supple, symmetrical, trachea midline and thyroid not enlarged, symmetric, no tenderness/mass/nodules Lymph nodes: Cervical, supraclavicular, and axillary nodes normal. Resp: clear to auscultation bilaterally Back: symmetric, no curvature. ROM normal. No CVA tenderness. Cardio: regular rate and rhythm, S1, S2 normal, no murmur, click, rub or gallop GI: soft, non-tender; bowel sounds normal; no masses,  no organomegaly Extremities: extremities normal, atraumatic, no cyanosis or edema Neurologic: Alert and oriented X 3, normal strength and tone. Normal symmetric reflexes. Normal coordination and gait  ECOG PERFORMANCE STATUS: 1 - Symptomatic but completely ambulatory  Blood pressure 127/55, pulse 79, temperature 97.9 F (36.6 C), temperature source Oral, resp. rate 18, height _0  (1.676 m), weight 144 lb 3.2 oz (65.409 kg), SpO2 98 %.  LABORATORY DATA: Lab Results  Component Value Date   WBC 3.3* 08/28/2015   HGB 9.1* 08/28/2015   HCT 27.7* 08/28/2015   MCV 109.5* 08/28/2015   PLT 153 08/28/2015      Chemistry      Component Value Date/Time   NA 139 06/26/2015 0805   NA 138 06/05/2015 1145   K 4.3 06/26/2015 0805   K 3.9 06/05/2015 1145   CL  100* 06/05/2015 1145   CO2 25 06/26/2015 0805   CO2 31 06/05/2015 1145   BUN 25.3 06/26/2015 0805   BUN 19 06/05/2015 1145   CREATININE 1.3 06/26/2015 0805   CREATININE 0.98 06/05/2015 1145      Component Value Date/Time   CALCIUM 9.9 06/26/2015 0805   CALCIUM 9.3 06/05/2015 1145   ALKPHOS 48 06/26/2015 0805   ALKPHOS 67 06/05/2015 1145   AST 12 06/26/2015 0805   AST 15 06/05/2015 1145   ALT 12 06/26/2015 0805   ALT 14* 06/05/2015 1145   BILITOT 0.31 06/26/2015 0805   BILITOT 0.5 06/05/2015 1145       RADIOGRAPHIC STUDIES:  ASSESSMENT AND PLAN: This is a very pleasant 81 years old white male recently diagnosed with:  1) Stage I laryngeal carcinoma involving the left vocal cord with 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 The patient also has hypermetabolic activity in the mediastinal lymph nodes but no enlarged lymph nodes in that area. He will continue the curative course of radiotherapy  as prescribed by Dr. [name] Moos  2) multiple myeloma currently asymptomatic with no lytic lesions but the recent myeloma panel showed evidence for disease progression. I may consider the patient for treatment with either subcutaneous Velcade and Decadron or Revlimid and Decadron once he complete his course of the curative radiotherapy to the laryngeal carcinoma. I would see the patient back for follow-up visit in around 4 months with repeat myeloma panel. He was advised to call immediately if he has any concerning symptoms in the interval. The patient voices understanding of current disease status and treatment options and is in agreement with the current care plan.  All questions were answered. The patient knows to call the clinic with any problems, questions or concerns. We can certainly see the patient much sooner if necessary.  Disclaimer: This note was dictated with voice recognition software. Similar sounding words can inadvertently be transcribed and may not be corrected upon  review.

## 2015-08-28 NOTE — Telephone Encounter (Signed)
Gave and printd appt sched and avs fo rpt for March 2017 °

## 2015-08-29 ENCOUNTER — Ambulatory Visit
Admission: RE | Admit: 2015-08-29 | Discharge: 2015-08-29 | Disposition: A | Discharge status: Home / Self Care | Payer: PPO | Source: Ambulatory Visit | Attending: Radiation Oncology | Admitting: Radiation Oncology

## 2015-08-29 DIAGNOSIS — Z510 Encounter for antineoplastic radiation therapy: Secondary | ICD-10-CM | POA: Diagnosis not present

## 2015-08-30 ENCOUNTER — Ambulatory Visit
Admission: RE | Admit: 2015-08-30 | Discharge: 2015-08-30 | Disposition: A | Discharge status: Home / Self Care | Payer: PPO | Source: Ambulatory Visit | Attending: Radiation Oncology | Admitting: Radiation Oncology

## 2015-08-30 DIAGNOSIS — Z510 Encounter for antineoplastic radiation therapy: Secondary | ICD-10-CM | POA: Diagnosis not present

## 2015-09-02 ENCOUNTER — Ambulatory Visit
Admission: RE | Admit: 2015-09-02 | Discharge: 2015-09-02 | Disposition: A | Discharge status: Home / Self Care | Payer: PPO | Source: Ambulatory Visit | Attending: Radiation Oncology | Admitting: Radiation Oncology

## 2015-09-02 ENCOUNTER — Telehealth: Payer: Self-pay | Admitting: *Deleted

## 2015-09-02 ENCOUNTER — Encounter: Payer: Self-pay | Admitting: Radiation Oncology

## 2015-09-02 ENCOUNTER — Other Ambulatory Visit: Payer: Self-pay | Admitting: Radiation Oncology

## 2015-09-02 VITALS — BP 125/54 | HR 60 | Temp 97.7°F | Ht 66.0 in | Wt 143.6 lb

## 2015-09-02 DIAGNOSIS — C9000 Multiple myeloma not having achieved remission: Secondary | ICD-10-CM

## 2015-09-02 DIAGNOSIS — Z510 Encounter for antineoplastic radiation therapy: Secondary | ICD-10-CM | POA: Diagnosis not present

## 2015-09-02 DIAGNOSIS — C320 Malignant neoplasm of glottis: Secondary | ICD-10-CM

## 2015-09-02 MED ORDER — SUCRALFATE 1 G PO TABS: ORAL_TABLET | ORAL | Status: DC

## 2015-09-02 MED ORDER — SONAFINE EX EMUL
1.0000 "application " | Freq: Two times a day (BID) | CUTANEOUS | Status: DC
  Administered 2015-09-02: 1 via TOPICAL
  Filled 2015-09-02: qty 45

## 2015-09-02 NOTE — Telephone Encounter (Signed)
CALLED PATIENT TO INFORM OF X-RAYS TOMORROW AFTER RT - 09-03-15 - 10:45 AM @ [facility], LVM FOR A RETURN CALL

## 2015-09-02 NOTE — Progress Notes (Signed)
Eric [name] presents for his 15th fraction of radiation to he Larynx. He denies any throat pain, and reports he is eating well without modifying his diet. He does report pain in his bilateral shoulders which Dr. Inda Merlin gave him Lyrica for, and he is taking 75-150 mg at night for pain. He also takes ultram 3 times daily for the shoulder pain. His mouth is normal in color, and the skin over his throat/neck is also normal in appearance. He is using the sonafine twice daily.   BP 125/54 mmHg  Pulse 60  Temp(Src) 97.7 F (36.5 C)  Ht 5\' 6"  (1.676 m)  Wt 143 lb 9.6 oz (65.137 kg)  BMI 23.19 kg/m2   Orthostatics: BP sitting 125/54. Pulse 60. BP standing 114/52, pulse 62.  Wt Readings from Last 3 Encounters:  09/02/15 143 lb 9.6 oz (65.137 kg)  08/28/15 144 lb 3.2 oz (65.409 kg)  08/26/15 144 lb 6.4 oz (65.499 kg)

## 2015-09-02 NOTE — Progress Notes (Signed)
   Weekly Management Note:  Outpatient    ICD-9-CM ICD-10-CM   1. 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  CBCT/MVCT images/Port film x-rays were reviewed.  The chart was checked. Mild throat pain, helped by lidocaine, still.  Continued shoulder and upper arm pain, bilaterally Per labwork, Dr Julien Nordmann reports myeloma is progressing;  Possible systemic therapy in future.  Physical Findings:  Wt Readings from Last 3 Encounters:  09/02/15 143 lb 9.6 oz (65.137 kg)  08/28/15 144 lb 3.2 oz (65.409 kg)  08/26/15 144 lb 6.4 oz (65.499 kg)    height is 5\' 6"  (1.676 m) and weight is 143 lb 9.6 oz (65.137 kg). His temperature is 97.7 F (36.5 C). His blood pressure is 125/54 and his pulse is 60.  NAD, skin intact over neck, no erythema; oral cavity moist/clear.   CBC    Component Value Date/Time   WBC 3.3* 08/28/2015 0812   WBC 3.9 06/05/2015 1145   RBC 2.53* 08/28/2015 0812   RBC 2.83* 06/05/2015 1145   HGB 9.1* 08/28/2015 0812   HGB 10.5* 06/05/2015 1145   HCT 27.7* 08/28/2015 0812   HCT 30.6* 06/05/2015 1145   PLT 153 08/28/2015 0812   PLT 234 06/05/2015 1145   MCV 109.5* 08/28/2015 0812   MCV 108.0* 06/05/2015 1145   MCH 36.0* 08/28/2015 0812   MCH 36.9* 06/05/2015 1145   MCHC 32.9 08/28/2015 0812   MCHC 34.2 06/05/2015 1145   RDW 15.1* 08/28/2015 0812   RDW 16.0* 06/05/2015 1145   LYMPHSABS 0.7* 08/28/2015 0812   LYMPHSABS 1.5 06/05/2015 1145   MONOABS 0.3 08/28/2015 0812   MONOABS 0.4 06/05/2015 1145   EOSABS 0.1 08/28/2015 0812   EOSABS 0.1 06/05/2015 1145   BASOSABS 0.0 08/28/2015 0812   BASOSABS 0.0 06/05/2015 1145     CMP     Component Value Date/Time   NA 141 08/28/2015 0812   NA 138 06/05/2015 1145   K 4.3 08/28/2015 0812   K 3.9 06/05/2015 1145   CL 100* 06/05/2015 1145    CO2 28 08/28/2015 0812   CO2 31 06/05/2015 1145   GLUCOSE 111 08/28/2015 0812   GLUCOSE 104* 06/05/2015 1145   BUN 16.6 08/28/2015 0812   BUN 19 06/05/2015 1145   CREATININE 1.1 08/28/2015 0812   CREATININE 0.98 06/05/2015 1145   CALCIUM 11.0* 08/28/2015 0812   CALCIUM 9.3 06/05/2015 1145   PROT 8.4* 08/28/2015 0812   PROT 7.9 06/05/2015 1145   ALBUMIN 2.8* 08/28/2015 0812   ALBUMIN 3.4* 06/05/2015 1145   AST 16 08/28/2015 0812   AST 15 06/05/2015 1145   ALT 10 08/28/2015 0812   ALT 14* 06/05/2015 1145   ALKPHOS 63 08/28/2015 0812   ALKPHOS 67 06/05/2015 1145   BILITOT <0.30 08/28/2015 0812   BILITOT 0.5 06/05/2015 1145   GFRNONAA >60 06/05/2015 1145   GFRAA >60 06/05/2015 1145     Impression:  The patient is tolerating radiotherapy.   Plan:  Continue radiotherapy as planned. Lidocaine and Carafate  Rx for throat.  Ordering xrays of upper arms to examine if there are myelomatous lesions. -----------------------------------  Eppie Gibson, MD

## 2015-09-03 ENCOUNTER — Ambulatory Visit (HOSPITAL_COMMUNITY)
Admission: RE | Admit: 2015-09-03 | Discharge: 2015-09-03 | Disposition: A | Discharge status: Home / Self Care | Payer: PPO | Source: Ambulatory Visit | Attending: Radiation Oncology | Admitting: Radiation Oncology

## 2015-09-03 ENCOUNTER — Ambulatory Visit
Admission: RE | Admit: 2015-09-03 | Discharge: 2015-09-03 | Disposition: A | Discharge status: Home / Self Care | Payer: PPO | Source: Ambulatory Visit | Attending: Radiation Oncology | Admitting: Radiation Oncology

## 2015-09-03 DIAGNOSIS — C9000 Multiple myeloma not having achieved remission: Secondary | ICD-10-CM | POA: Diagnosis not present

## 2015-09-03 DIAGNOSIS — Z510 Encounter for antineoplastic radiation therapy: Secondary | ICD-10-CM | POA: Diagnosis not present

## 2015-09-04 ENCOUNTER — Ambulatory Visit
Admission: RE | Admit: 2015-09-04 | Discharge: 2015-09-04 | Disposition: A | Discharge status: Home / Self Care | Payer: PPO | Source: Ambulatory Visit | Attending: Radiation Oncology | Admitting: Radiation Oncology

## 2015-09-04 DIAGNOSIS — Z510 Encounter for antineoplastic radiation therapy: Secondary | ICD-10-CM | POA: Diagnosis not present

## 2015-09-04 DIAGNOSIS — C320 Malignant neoplasm of glottis: Secondary | ICD-10-CM

## 2015-09-04 NOTE — Progress Notes (Signed)
   Weekly Management Note:  Outpatient    ICD-9-CM ICD-10-CM   1. 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  CBCT/MVCT images/Port film x-rays were reviewed.  The chart was checked. He is doing relatively well, but is here for XRAY results from his humerii.  Pain in right arm is better. Pain in left arm radiates to his neck.  Pain is mainly in upper 2/3 of each arm.  Physical Findings:  vitals were not taken for this visit.  Wt Readings from Last 3 Encounters:  09/02/15 143 lb 9.6 oz (65.137 kg)  08/28/15 144 lb 3.2 oz (65.409 kg)  08/26/15 144 lb 6.4 oz (65.499 kg)   NAD Not tender to palpation in arms  Impression:  The patient is tolerating radiotherapy.  Plan:  Continue radiotherapy as planned.   Xrays showed myelomatous changes which may be root of arm pain.  Recommend 8Gy/1 fraction palliative RT to humerii bilaterally. Discussed risks, benefits, and potential side effects of this treatment. He is enthusiastic to proceed. Will order simulation. Consent signed. ________________________________   Eppie Gibson, M.D.

## 2015-09-05 ENCOUNTER — Ambulatory Visit
Admission: RE | Admit: 2015-09-05 | Discharge: 2015-09-05 | Disposition: A | Discharge status: Home / Self Care | Payer: PPO | Source: Ambulatory Visit | Attending: Radiation Oncology | Admitting: Radiation Oncology

## 2015-09-05 ENCOUNTER — Encounter: Payer: Self-pay | Admitting: Adult Health

## 2015-09-05 DIAGNOSIS — Z510 Encounter for antineoplastic radiation therapy: Secondary | ICD-10-CM | POA: Diagnosis not present

## 2015-09-05 NOTE — Progress Notes (Signed)
     I was asked by [name] Malta, RN to evaluate Eric [name] following his IMRT treatment today for glottic cancer after he reported a fall this morning at home in the bathroom.   Eric [name] was seen in an exam room, seated in a wheelchair. Alert & oriented x 4. He reports that he suffers from vertigo, and has for many years, and he suspects this was the cause for his fall this morning. He states that he got out of bed and "made it to the bathroom, where I was able to hold onto the counter and help myself down to the ground." He denies hitting his head. He denies any loss of consciousness during this event. He denies any new or worsening bone pain. He actually states that he feels that his pain is a little better today than it was in previous days and the fall has not affected his bone pain. He takes Norco, Lyrica, and Tramadol for pain. He also has taken Klonopin at bedtime for quite some time. He denies taking any pain medication prior to his fall this morning.   Orthostatic VS were assessed here in clinic and were grossly negative for orthostatic hypotension. Eric [name] does report that he continues to eat a regular diet and is having no dysphagia, but he is not drinking very much fluid. He reports drinking 1 cup of coffee and about 2 cups of water per day. He states that he has never really been someone who drank much fluid. Clinically, he doesn't appear to be severely dehydrated. However, I did encourage Eric [name] to try to push some fluids for the remainder of today and into tomorrow to see if this may help mitigate some of his vertigo symptoms. I encouraged him to use caution when ambulating at home and to allow himself plenty of time for walking to prevent additional falls.   He and his wife report that they have not gotten the Carafate prescription filled yet, which was provided to them by Dr. [name] Moos. I encouraged them to do so when they were able and reiterated the importance of this  medication for head & neck cancer patients. He states that he may be a little constipated/slow gut transit and I encouraged him to use something like Miralax OTC to help prevent constipation, particularly in the setting of using opiates.   I will plan on checking on him tomorrow after he finishes his treatment to follow-up on how he is feeling. He and his wife agreed with this plan. I gave them my business card and encouraged them to call me if they had any additional concerns this evening or tomorrow.    Mike Craze, NP [place] [phone]

## 2015-09-05 NOTE — Progress Notes (Signed)
Eric [name] presented today after treatment stating he had fallen at home this morning while shaving. He was able to lower himself to the floor, and denies any injury. He states he is just not feeling well, but also states he has chronic vertigo, and felt dizzy at the time of his fall. The previously known pain over his shoulders and Left side has not changed. He reports eating well but drinking very little, and I encouraged him to drink as much as he is able. Mike Craze NP was also in to assess the patient today.   His orthostatics are as follows: BP sitting 138/57 and pulse 66. BP standing 128/52 and pulse 70.

## 2015-09-05 NOTE — Progress Notes (Signed)
    I was asked by Anderson Malta, RN to evaluate Eric [name] following his IMRT treatment today for glottic cancer after he reported a fall this morning at home in the bathroom.    Eric [name] was seen in an exam room, seated in a wheelchair. Alert & oriented x 4. He reports that he suffers from vertigo, and has for many years, and he suspects this was the cause for his fall this morning.  He states that he got out of bed and "made it to the bathroom, where I was able to hold onto the counter and help myself down to the ground."  He denies hitting his head. He denies any loss of consciousness during this event.  He denies any new or worsening bone pain.  He actually states that he feels that his pain is a little better today than it was in previous days and the fall has not affected his bone pain.  He takes Norco, Lyrica, and Tramadol for pain.  He also has taken Klonopin at bedtime for quite some time.  He denies taking any pain medication prior to his fall this morning.    Orthostatic VS were assessed here in clinic and were grossly negative for orthostatic hypotension.  Eric [name] does report that he continues to eat a regular diet and is having no dysphagia, but he is not drinking very much fluid.  He reports drinking 1 cup of coffee and about 2 cups of water per day.  He states that he has never really been someone who drank much fluid.   Clinically, he doesn't appear to be severely dehydrated.  However, I did encourage Eric [name] to try to push some fluids for the remainder of today and into tomorrow to see if this may help mitigate some of his vertigo symptoms.  I encouraged him to use caution when ambulating at home and to allow himself plenty of time for walking to prevent additional falls.    He and his wife report that they have not gotten the Carafate prescription filled yet, which was provided to them by Dr. [name] Moos.  I encouraged them to do so when they were able and reiterated the importance of this  medication for head & neck cancer patients.  He states that he may be a little constipated/slow gut transit and I encouraged him to use something like Miralax OTC to help prevent constipation, particularly in the setting of using opiates.   I will plan on checking on him tomorrow after he finishes his treatment to follow-up on how he is feeling.  He and his wife agreed with this plan. I gave them my business card and encouraged them to call me if they had any additional concerns this evening or tomorrow.     Mike Craze, NP [place] [phone]

## 2015-09-06 ENCOUNTER — Encounter: Payer: Self-pay | Admitting: *Deleted

## 2015-09-06 ENCOUNTER — Ambulatory Visit
Admission: RE | Admit: 2015-09-06 | Discharge: 2015-09-06 | Disposition: A | Discharge status: Home / Self Care | Payer: PPO | Source: Ambulatory Visit | Attending: Radiation Oncology | Admitting: Radiation Oncology

## 2015-09-06 ENCOUNTER — Encounter: Payer: Self-pay | Admitting: Adult Health

## 2015-09-06 DIAGNOSIS — Z510 Encounter for antineoplastic radiation therapy: Secondary | ICD-10-CM | POA: Diagnosis not present

## 2015-09-06 NOTE — Progress Notes (Signed)
  Oncology Nurse Navigator Documentation   Navigator Encounter Type: Other (09/06/15 1601)     Barriers/Navigation Needs: Family concerns;Education (09/06/15 1601)     Provided secure email response to patient dtr who had questions about his recent Xray findings, palliative RT proposed by Dr. [name] Moos, recent episodes of vertigo.  [name], RN, BSN, [place] at [place] [phone]               Time Spent with Patient: 15 (09/06/15 1601)

## 2015-09-06 NOTE — Progress Notes (Signed)
  Oncology Nurse Navigator Documentation   Navigator Encounter Type: Treatment;Other (09/06/15 1110) Patient Visit Type: Radonc (09/06/15 1110)     Met with patient and his wife after Tomo.  Escorted them to see Jenny Reichmann, [employer], for financial counseling.  [name], RN, BSN, [place] at [place] [phone]                    Time Spent with Patient: 30 (09/06/15 1110)

## 2015-09-06 NOTE — Progress Notes (Signed)
I followed up with Eric [name] and his wife today after his scheduled radiation treatment.  His wife let me know that he has not had any more "dizzy spells" in the past 24 hours and he is continuing to eat well.  She stated that she is still trying to have him drink more fluids. I briefly spoke with Eric [name] himself and he tells me that he is feeling better today and slept very well last night.  He hasn't felt dizzy today at all.    His wife did share with me that they are facing some financial hardships.  I let her know that I would contact our social work team to see if they may have some resources available for them while he completes his treatment (at the very minimum some gas cards).  I emailed Loren Racer, LCSW and asked her to contact the patient and his wife when she is able.  [name], RN is also going to see if the patient and wife need to speak with our financial advocates here at the cancer center for further assistance.    In the meantime, I encouraged Eric [name] and his wife to call me with any additional concerns.  They voiced understanding and appreciation.   Mike Craze, NP [place] [phone]

## 2015-09-08 ENCOUNTER — Ambulatory Visit: Payer: PPO

## 2015-09-08 ENCOUNTER — Inpatient Hospital Stay (HOSPITAL_COMMUNITY)
Admission: EM | Admit: 2015-09-08 | Discharge: 2015-09-10 | DRG: 194 | Disposition: A | Discharge status: Home / Self Care | Payer: PPO | Attending: Internal Medicine | Admitting: Internal Medicine

## 2015-09-08 ENCOUNTER — Encounter (HOSPITAL_COMMUNITY): Payer: Self-pay | Admitting: Emergency Medicine

## 2015-09-08 ENCOUNTER — Emergency Department (HOSPITAL_COMMUNITY): Payer: PPO

## 2015-09-08 DIAGNOSIS — C320 Malignant neoplasm of glottis: Secondary | ICD-10-CM

## 2015-09-08 DIAGNOSIS — Z923 Personal history of irradiation: Secondary | ICD-10-CM

## 2015-09-08 DIAGNOSIS — D472 Monoclonal gammopathy: Secondary | ICD-10-CM | POA: Diagnosis present

## 2015-09-08 DIAGNOSIS — K219 Gastro-esophageal reflux disease without esophagitis: Secondary | ICD-10-CM | POA: Diagnosis present

## 2015-09-08 DIAGNOSIS — M1990 Unspecified osteoarthritis, unspecified site: Secondary | ICD-10-CM | POA: Diagnosis present

## 2015-09-08 DIAGNOSIS — I2510 Atherosclerotic heart disease of native coronary artery without angina pectoris: Secondary | ICD-10-CM | POA: Diagnosis present

## 2015-09-08 DIAGNOSIS — G2581 Restless legs syndrome: Secondary | ICD-10-CM | POA: Diagnosis present

## 2015-09-08 DIAGNOSIS — I10 Essential (primary) hypertension: Secondary | ICD-10-CM | POA: Diagnosis present

## 2015-09-08 DIAGNOSIS — Z87891 Personal history of nicotine dependence: Secondary | ICD-10-CM | POA: Diagnosis not present

## 2015-09-08 DIAGNOSIS — H9190 Unspecified hearing loss, unspecified ear: Secondary | ICD-10-CM | POA: Diagnosis present

## 2015-09-08 DIAGNOSIS — I739 Peripheral vascular disease, unspecified: Secondary | ICD-10-CM | POA: Diagnosis present

## 2015-09-08 DIAGNOSIS — D6182 Myelophthisis: Secondary | ICD-10-CM | POA: Diagnosis not present

## 2015-09-08 DIAGNOSIS — M810 Age-related osteoporosis without current pathological fracture: Secondary | ICD-10-CM | POA: Diagnosis present

## 2015-09-08 DIAGNOSIS — Z7982 Long term (current) use of aspirin: Secondary | ICD-10-CM | POA: Diagnosis not present

## 2015-09-08 DIAGNOSIS — Z85828 Personal history of other malignant neoplasm of skin: Secondary | ICD-10-CM

## 2015-09-08 DIAGNOSIS — E7800 Pure hypercholesterolemia, unspecified: Secondary | ICD-10-CM | POA: Diagnosis present

## 2015-09-08 DIAGNOSIS — H409 Unspecified glaucoma: Secondary | ICD-10-CM | POA: Diagnosis present

## 2015-09-08 DIAGNOSIS — Z833 Family history of diabetes mellitus: Secondary | ICD-10-CM | POA: Diagnosis not present

## 2015-09-08 DIAGNOSIS — Z79899 Other long term (current) drug therapy: Secondary | ICD-10-CM

## 2015-09-08 DIAGNOSIS — Z809 Family history of malignant neoplasm, unspecified: Secondary | ICD-10-CM

## 2015-09-08 DIAGNOSIS — Z510 Encounter for antineoplastic radiation therapy: Secondary | ICD-10-CM | POA: Diagnosis present

## 2015-09-08 DIAGNOSIS — C9000 Multiple myeloma not having achieved remission: Secondary | ICD-10-CM | POA: Diagnosis not present

## 2015-09-08 DIAGNOSIS — D649 Anemia, unspecified: Secondary | ICD-10-CM | POA: Diagnosis present

## 2015-09-08 DIAGNOSIS — Z85038 Personal history of other malignant neoplasm of large intestine: Secondary | ICD-10-CM | POA: Diagnosis not present

## 2015-09-08 DIAGNOSIS — J189 Pneumonia, unspecified organism: Secondary | ICD-10-CM | POA: Diagnosis present

## 2015-09-08 DIAGNOSIS — D539 Nutritional anemia, unspecified: Secondary | ICD-10-CM | POA: Diagnosis present

## 2015-09-08 DIAGNOSIS — Z8673 Personal history of transient ischemic attack (TIA), and cerebral infarction without residual deficits: Secondary | ICD-10-CM

## 2015-09-08 DIAGNOSIS — F0390 Unspecified dementia without behavioral disturbance: Secondary | ICD-10-CM | POA: Diagnosis present

## 2015-09-08 DIAGNOSIS — Z8249 Family history of ischemic heart disease and other diseases of the circulatory system: Secondary | ICD-10-CM | POA: Diagnosis not present

## 2015-09-08 DIAGNOSIS — R509 Fever, unspecified: Secondary | ICD-10-CM | POA: Diagnosis present

## 2015-09-08 DIAGNOSIS — Y95 Nosocomial condition: Secondary | ICD-10-CM | POA: Diagnosis present

## 2015-09-08 LAB — CBC WITH DIFFERENTIAL/PLATELET
BASOS ABS: 0 10*3/uL (ref 0.0–0.1)
Basophils Relative: 0 %
EOS PCT: 1 %
Eosinophils Absolute: 0 10*3/uL (ref 0.0–0.7)
HCT: 23.4 % — ABNORMAL LOW (ref 39.0–52.0)
Hemoglobin: 7.8 g/dL — ABNORMAL LOW (ref 13.0–17.0)
LYMPHS PCT: 14 %
Lymphs Abs: 0.4 10*3/uL — ABNORMAL LOW (ref 0.7–4.0)
MCH: 36.6 pg — ABNORMAL HIGH (ref 26.0–34.0)
MCHC: 33.3 g/dL (ref 30.0–36.0)
MCV: 109.9 fL — AB (ref 78.0–100.0)
Monocytes Absolute: 0.2 10*3/uL (ref 0.1–1.0)
Monocytes Relative: 7 %
NEUTROS PCT: 78 %
Neutro Abs: 2 10*3/uL (ref 1.7–7.7)
PLATELETS: 151 10*3/uL (ref 150–400)
RBC: 2.13 MIL/uL — AB (ref 4.22–5.81)
RDW: 15.9 % — ABNORMAL HIGH (ref 11.5–15.5)
WBC: 2.6 10*3/uL — AB (ref 4.0–10.5)

## 2015-09-08 LAB — COMPREHENSIVE METABOLIC PANEL
ALBUMIN: 2.5 g/dL — AB (ref 3.5–5.0)
ALK PHOS: 49 U/L (ref 38–126)
ALT: 12 U/L — AB (ref 17–63)
ANION GAP: 11 (ref 5–15)
AST: 16 U/L (ref 15–41)
BUN: 18 mg/dL (ref 6–20)
CHLORIDE: 98 mmol/L — AB (ref 101–111)
CO2: 28 mmol/L (ref 22–32)
CREATININE: 1.21 mg/dL (ref 0.61–1.24)
Calcium: 10.3 mg/dL (ref 8.9–10.3)
GFR, EST NON AFRICAN AMERICAN: 54 mL/min — AB (ref >60)
Glucose, Bld: 106 mg/dL — ABNORMAL HIGH (ref 65–99)
POTASSIUM: 3.7 mmol/L (ref 3.5–5.1)
Sodium: 137 mmol/L (ref 135–145)
Total Bilirubin: 0.7 mg/dL (ref 0.3–1.2)
Total Protein: 7.8 g/dL (ref 6.5–8.1)

## 2015-09-08 LAB — URINALYSIS, ROUTINE W REFLEX MICROSCOPIC
BILIRUBIN URINE: NEGATIVE
Glucose, UA: NEGATIVE mg/dL
Hgb urine dipstick: NEGATIVE
Ketones, ur: NEGATIVE mg/dL
LEUKOCYTES UA: NEGATIVE
NITRITE: NEGATIVE
Protein, ur: NEGATIVE mg/dL
SPECIFIC GRAVITY, URINE: 1.005 (ref 1.005–1.030)
pH: 6.5 (ref 5.0–8.0)

## 2015-09-08 LAB — TSH: TSH: 0.934 u[IU]/mL (ref 0.350–4.500)

## 2015-09-08 LAB — PROTIME-INR
INR: 1.29 (ref 0.00–1.49)
PROTHROMBIN TIME: 16.2 s — AB (ref 11.6–15.2)

## 2015-09-08 LAB — I-STAT CG4 LACTIC ACID, ED: LACTIC ACID, VENOUS: 1.44 mmol/L (ref 0.5–2.0)

## 2015-09-08 MED ORDER — VANCOMYCIN HCL 500 MG IV SOLR
500.0000 mg | Freq: Two times a day (BID) | INTRAVENOUS | Status: DC
  Administered 2015-09-08 – 2015-09-10 (×4): 500 mg via INTRAVENOUS
  Filled 2015-09-08 (×5): qty 500

## 2015-09-08 MED ORDER — PREGABALIN 75 MG PO CAPS
75.0000 mg | ORAL_CAPSULE | Freq: Every day | ORAL | Status: DC
  Administered 2015-09-08 – 2015-09-10 (×3): 75 mg via ORAL
  Filled 2015-09-08 (×3): qty 1

## 2015-09-08 MED ORDER — CEFTAZIDIME 2 G IJ SOLR: 2.0000 g | Freq: Three times a day (TID) | INTRAMUSCULAR | Status: DC

## 2015-09-08 MED ORDER — ONDANSETRON HCL 4 MG/2ML IJ SOLN: 4.0000 mg | Freq: Four times a day (QID) | INTRAMUSCULAR | Status: DC | PRN

## 2015-09-08 MED ORDER — PANTOPRAZOLE SODIUM 40 MG PO TBEC
40.0000 mg | DELAYED_RELEASE_TABLET | Freq: Every day | ORAL | Status: DC
Start: 2015-09-09 — End: 2015-09-10
  Administered 2015-09-09 – 2015-09-10 (×2): 40 mg via ORAL
  Filled 2015-09-08 (×2): qty 1

## 2015-09-08 MED ORDER — TRAMADOL HCL 50 MG PO TABS
50.0000 mg | ORAL_TABLET | Freq: Four times a day (QID) | ORAL | Status: DC | PRN
  Administered 2015-09-08 – 2015-09-10 (×6): 50 mg via ORAL
  Filled 2015-09-08 (×7): qty 1

## 2015-09-08 MED ORDER — IPRATROPIUM-ALBUTEROL 0.5-2.5 (3) MG/3ML IN SOLN
3.0000 mL | RESPIRATORY_TRACT | Status: DC
  Administered 2015-09-08 (×2): 3 mL via RESPIRATORY_TRACT
  Filled 2015-09-08 (×2): qty 3

## 2015-09-08 MED ORDER — HYDROCODONE-ACETAMINOPHEN 5-325 MG PO TABS
1.0000 | ORAL_TABLET | ORAL | Status: DC | PRN
  Filled 2015-09-08: qty 1

## 2015-09-08 MED ORDER — DEXTROSE 5 % IV SOLN
1.0000 g | Freq: Three times a day (TID) | INTRAVENOUS | Status: DC
  Administered 2015-09-08 – 2015-09-10 (×6): 1 g via INTRAVENOUS
  Filled 2015-09-08 (×8): qty 1

## 2015-09-08 MED ORDER — ONDANSETRON HCL 4 MG PO TABS: 4.0000 mg | ORAL_TABLET | Freq: Four times a day (QID) | ORAL | Status: DC | PRN

## 2015-09-08 MED ORDER — GUAIFENESIN ER 600 MG PO TB12
1200.0000 mg | ORAL_TABLET | Freq: Two times a day (BID) | ORAL | Status: DC
  Administered 2015-09-09 – 2015-09-10 (×3): 1200 mg via ORAL
  Filled 2015-09-08 (×4): qty 2

## 2015-09-08 MED ORDER — CLONAZEPAM 0.5 MG PO TABS
0.5000 mg | ORAL_TABLET | Freq: Every day | ORAL | Status: DC
  Administered 2015-09-08 – 2015-09-09 (×2): 0.5 mg via ORAL
  Filled 2015-09-08 (×2): qty 1

## 2015-09-08 MED ORDER — ACETAMINOPHEN 650 MG RE SUPP: 650.0000 mg | Freq: Four times a day (QID) | RECTAL | Status: DC | PRN

## 2015-09-08 MED ORDER — LATANOPROST 0.005 % OP SOLN
1.0000 [drp] | Freq: Every day | OPHTHALMIC | Status: DC
Start: 2015-09-08 — End: 2015-09-10
  Administered 2015-09-08 – 2015-09-09 (×2): 1 [drp] via OPHTHALMIC
  Filled 2015-09-08: qty 2.5

## 2015-09-08 MED ORDER — POTASSIUM CHLORIDE CRYS ER 20 MEQ PO TBCR
40.0000 meq | EXTENDED_RELEASE_TABLET | Freq: Once | ORAL | Status: AC
  Administered 2015-09-08: 40 meq via ORAL
  Filled 2015-09-08: qty 2

## 2015-09-08 MED ORDER — ACETAMINOPHEN 325 MG PO TABS
650.0000 mg | ORAL_TABLET | Freq: Four times a day (QID) | ORAL | Status: DC | PRN
  Administered 2015-09-10: 650 mg via ORAL
  Filled 2015-09-08: qty 2

## 2015-09-08 MED ORDER — ASPIRIN EC 81 MG PO TBEC
81.0000 mg | DELAYED_RELEASE_TABLET | Freq: Every day | ORAL | Status: DC
  Administered 2015-09-08 – 2015-09-09 (×2): 81 mg via ORAL
  Filled 2015-09-08 (×2): qty 1

## 2015-09-08 MED ORDER — GUAIFENESIN-DM 100-10 MG/5ML PO SYRP
5.0000 mL | ORAL_SOLUTION | ORAL | Status: DC | PRN
  Administered 2015-09-09: 5 mL via ORAL
  Filled 2015-09-08: qty 10

## 2015-09-08 MED ORDER — ALBUTEROL SULFATE (2.5 MG/3ML) 0.083% IN NEBU: 2.5000 mg | INHALATION_SOLUTION | RESPIRATORY_TRACT | Status: DC | PRN

## 2015-09-08 MED ORDER — SODIUM CHLORIDE 0.9 % IV BOLUS (SEPSIS)
1000.0000 mL | Freq: Once | INTRAVENOUS | Status: AC
  Administered 2015-09-08: 1000 mL via INTRAVENOUS

## 2015-09-08 MED ORDER — SODIUM CHLORIDE 0.9 % IV SOLN
INTRAVENOUS | Status: DC
  Administered 2015-09-08: 1000 mL via INTRAVENOUS
  Administered 2015-09-09: 07:00:00 via INTRAVENOUS

## 2015-09-08 MED ORDER — MORPHINE SULFATE (PF) 2 MG/ML IV SOLN: 1.0000 mg | INTRAVENOUS | Status: DC | PRN

## 2015-09-08 MED ORDER — IPRATROPIUM-ALBUTEROL 0.5-2.5 (3) MG/3ML IN SOLN
3.0000 mL | Freq: Three times a day (TID) | RESPIRATORY_TRACT | Status: DC
Start: 2015-09-09 — End: 2015-09-10
  Administered 2015-09-09 – 2015-09-10 (×3): 3 mL via RESPIRATORY_TRACT
  Filled 2015-09-08 (×4): qty 3

## 2015-09-08 MED ORDER — ENOXAPARIN SODIUM 40 MG/0.4ML ~~LOC~~ SOLN
40.0000 mg | SUBCUTANEOUS | Status: DC
  Administered 2015-09-08 – 2015-09-09 (×2): 40 mg via SUBCUTANEOUS
  Filled 2015-09-08 (×2): qty 0.4

## 2015-09-08 MED ORDER — VANCOMYCIN HCL IN DEXTROSE 1-5 GM/200ML-% IV SOLN
1000.0000 mg | Freq: Once | INTRAVENOUS | Status: AC
  Administered 2015-09-08: 1000 mg via INTRAVENOUS
  Filled 2015-09-08: qty 200

## 2015-09-08 MED ORDER — TIMOLOL MALEATE 0.5 % OP SOLN
1.0000 [drp] | Freq: Every morning | OPHTHALMIC | Status: DC
  Administered 2015-09-09: 1 [drp] via OPHTHALMIC
  Filled 2015-09-08: qty 5

## 2015-09-08 NOTE — ED Notes (Signed)
Attempted to call report.  Nurse still busy.

## 2015-09-08 NOTE — ED Provider Notes (Signed)
CSN: [id number]     Arrival date & time 09/08/15  1023 History   First MD Initiated Contact with Patient 09/08/15 1027     Chief Complaint  Patient presents with  . Fever  . Cancer     Patient is a 81 y.o. male presenting with fever. The history is provided by the patient, the spouse and a relative. No language interpreter was used.  Fever  Maude Gloor is a 81 y.o. male who presents to the Emergency Department complaining of fever. History is provided by the patient and his family. 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 He is currently undergoing radiation therapy for the vocal cord lesion, no chemotherapy. Family reports increased cough and congestion for the last few days and when they went to wake him up this morning he had a fever to 102.9 orally. He was given Tylenol and brought into the emergency department for further evaluation. He reports feeling poorly in general. No vomiting, abdominal pain, diarrhea, dysuria. Family also note that he's had bruising to the left foot with no known injury.  Past Medical History  Diagnosis Date  . Peripheral vascular disease (HCC)   . Transient global amnesia   . Hypercholesterolemia   . Mesenteric thrombosis (HCC)     post colon resection  . Hypertension   . Macrocytic anemia   . Glaucoma   . Osteoporosis   . GERD (gastroesophageal reflux disease)   . Cholelithiasis   . Shingles 2009  . Pneumonia 02-2011    left lower lobe  . CAD (coronary artery disease)   . Colon cancer (HCC)     Multiple Mylemona  . Stroke [facility])     'Mini Stroke"  . Dementia   . Skin cancer     ears  . Peripheral arterial disease (HCC)   . Shortness of breath dyspnea     with exertion  . Arthritis   . Restless leg syndrome   . 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 Hearing loss    Past Surgical History  Procedure Laterality Date  . Laminectomies      multiple  . Exploratory laparotomy      treat  twisted intestines  . Colectomy      to remove adenoma  . Femoral artery stenting    . Back surgery      lower back  . Endarterectomy femoral Left 05/02/2014    Procedure: LEFT FEMORAL ENDARTERECTOMY ;  Surgeon: Larina Earthly, MD;  Location: [facility] OR;  Service: Vascular;  Laterality: Left;  . Abdominal aortagram N/A 03/28/2014    Procedure: ABDOMINAL AORTAGRAM;  Surgeon: Larina Earthly, MD;  Location: [facility] CATH LAB;  Service: Cardiovascular;  Laterality: N/A;  . Eye surgery Left     cataract extraction  . Microlaryngoscopy N/A 05/28/2015    Procedure: MICROLARYNGOSCOPY/ WITH BIOPSY;  Surgeon: Linus Salmons, MD;  Location: [facility] ORS;  Service: ENT;  Laterality: N/A;   Family History  Problem Relation Age of Onset  . Diabetes Mother   . Heart disease Mother   . Hypertension Mother   . Heart disease Father   . Hypertension Father   . Diabetes Father   . Hyperlipidemia Father   . Peripheral vascular disease Father   . Cancer Brother   . Diabetes Brother   . Hypertension Brother   . Heart disease Brother     before age 30  . Cancer Sister  Social History  Substance Use Topics  . Smoking status: Former Smoker    Types: Cigarettes    Quit date: 09/28/1988  . Smokeless tobacco: Current User    Types: Chew  . Alcohol Use: No     Comment: rare    Review of Systems  Constitutional: Positive for fever.  All other systems reviewed and are negative.     Allergies  Review of patient's allergies indicates no known allergies.  Home Medications   Prior to Admission medications   Medication Sig Start Date End Date Taking? Authorizing Provider  aspirin EC 81 MG tablet Take 81 mg by mouth daily.    Historical Provider, MD  clonazePAM (KLONOPIN) 0.5 MG tablet Take 0.5 mg by mouth at bedtime.     Historical Provider, MD  hydrochlorothiazide (HYDRODIURIL) 25 MG tablet Take 25 mg by mouth daily.    Historical Provider, MD  HYDROcodone-acetaminophen (NORCO) 5-325 MG tablet Take 1 tablet by  mouth every 4 (four) hours as needed. Patient not taking: Reported on 09/02/2015 08/24/15   Daleen Bo, MD  latanoprost (XALATAN) 0.005 % ophthalmic solution Place 1 drop into both eyes at bedtime.  03/09/14   Historical Provider, MD  lidocaine (XYLOCAINE) 2 % solution Patient: Mix 1part 2% viscous lidocaine, 1part H20. Swish and/or swallow 87mL of this mixture, 79min before meals and at bedtime, up to QID 08/19/15   Eppie Gibson, MD  lisinopril (PRINIVIL,ZESTRIL) 5 MG tablet Take 5 mg by mouth daily.    Historical Provider, MD  omeprazole (PRILOSEC OTC) 20 MG tablet Take 20 mg by mouth at bedtime.    Historical Provider, MD  pregabalin (LYRICA) 75 MG capsule Take 75 mg by mouth daily. He is taking two 75 mg tablets at bedtime, and is supposed to let Dr. Inda Merlin know if it is working.    Historical Provider, MD  sucralfate (CARAFATE) 1 G tablet Dissolve 1 tablet in 10 mL H20 and swallow up to QID for throat soreness. 09/02/15   Eppie Gibson, MD  Timolol Maleate PF 0.5 % SOLN Apply 1 drop to eye daily.    Historical Provider, MD  traMADol (ULTRAM) 50 MG tablet Take 1 tablet (50 mg total) by mouth every 6 (six) hours as needed. 05/03/14   Samantha J Rhyne, PA-C   BP 116/53 mmHg  Pulse 82  Temp(Src) 98.6 F (37 C) (Oral)  Resp 18  SpO2 97% Physical Exam  Constitutional: He is oriented to person, place, and time. He appears well-developed and well-nourished.  HENT:  Head: Normocephalic and atraumatic.  Mouth/Throat: Oropharynx is clear and moist.  Cardiovascular: Normal rate and regular rhythm.   No murmur heard. Pulmonary/Chest: Effort normal. No respiratory distress.  hoarse voice without stridor. Intermittent crackles in the right midlung.  Abdominal: Soft. There is no tenderness. There is no rebound and no guarding.  Musculoskeletal: He exhibits no edema.  2+ DP pulses bilaterally. There is ecchymosis to the left mid and distal foot with mild local tenderness.  Neurological: He is alert and  oriented to person, place, and time.  Skin: Skin is warm and dry.  Psychiatric: He has a normal mood and affect. His behavior is normal.  Nursing note and vitals reviewed.   ED Course  Procedures (including critical care time) Labs Review Labs Reviewed  COMPREHENSIVE METABOLIC PANEL - Abnormal; Notable for the following:    Chloride 98 (*)    Glucose, Bld 106 (*)    Albumin 2.5 (*)    ALT 12 (*)  GFR calc non Af Amer 54 (*)    All other components within normal limits  CBC WITH DIFFERENTIAL/PLATELET - Abnormal; Notable for the following:    WBC 2.6 (*)    RBC 2.13 (*)    Hemoglobin 7.8 (*)    HCT 23.4 (*)    MCV 109.9 (*)    MCH 36.6 (*)    RDW 15.9 (*)    Lymphs Abs 0.4 (*)    All other components within normal limits  PROTIME-INR - Abnormal; Notable for the following:    Prothrombin Time 16.2 (*)    All other components within normal limits  CULTURE, BLOOD (ROUTINE X 2)  CULTURE, BLOOD (ROUTINE X 2)  URINE CULTURE  URINALYSIS, ROUTINE W REFLEX MICROSCOPIC (NOT AT [facility])  TSH  HEMOGLOBIN A1C  I-STAT CG4 LACTIC ACID, ED    Imaging Review Dg Chest 2 View  09/08/2015  CLINICAL DATA:  Laryngeal carcinoma.  Cough and confusion.  Fever. EXAM: CHEST  2 VIEW COMPARISON:  Chest radiograph April 19, 2014 and chest CT June 07, 2015 FINDINGS: There is right middle lobe consolidation with volume loss, much better seen on the lateral view. There is a small granuloma in the right upper lobe. Lungs elsewhere clear. Heart size and pulmonary vascularity are normal. No adenopathy by size criteria appreciated radiographically. There is stable marked collapse of a mid thoracic vertebral body, stable. IMPRESSION: Consolidation in a portion of the right middle lobe with volume loss, much better seen on lateral view. Heart size within normal limits. Stable collapse of a mid thoracic vertebral body. Electronically Signed   By: Lowella Grip III M.D.   On: 09/08/2015 11:13   Dg Foot  Complete Left  09/08/2015  CLINICAL DATA:  Pain and bruising for a couple of weeks no known injury EXAM: LEFT FOOT - COMPLETE 3+ VIEW COMPARISON:  None. FINDINGS: Three views of the left foot submitted. No acute fracture or subluxation. No periosteal reaction or bony erosion. No radiopaque foreign body. IMPRESSION: Negative. Electronically Signed   By: Lahoma Crocker M.D.   On: 09/08/2015 11:11   I have personally reviewed and evaluated these images and lab results as part of my medical decision-making.   EKG Interpretation   Date/Time:  Sunday September 08 2015 10:33:01 EST Ventricular Rate:  82 PR Interval:  150 QRS Duration: 86 QT Interval:  346 QTC Calculation: 404 R Axis:   122 Text Interpretation:  Right and left arm electrode reversal,  interpretation assumes no reversal Sinus rhythm Right axis deviation RSR'  in V1 or V2, probably normal variant Abnormal T, consider ischemia,  lateral leads Baseline wander in lead(s) V2 Confirmed by Hazle Coca [phone])  on 09/08/2015 10:41:27 AM      MDM   Final diagnoses:  HCAP (healthcare-associated pneumonia)    Patient currently on radiation therapy for vocal cord cancer here with fever, cough. History, exam, radiographs consistent with pneumonia, treating for HCAP given his radiation therapy. Treating with IV fluids, antibiotics. Discussed with patient and family findings studies recommendation for admission. Patient family are in agreement with plan.    Quintella Reichert, MD 09/08/15 [phone]

## 2015-09-08 NOTE — ED Notes (Signed)
Attempted to call report.  Nurse busy. 

## 2015-09-08 NOTE — H&P (Signed)
[facility] History and Physical  Eric [name] DPO:[id number] DOB: 03/06/1930 DOA: 09/08/2015  Referring physician: EDP PCP: Mathews Argyle, MD   Chief Complaint: None  HPI: Eric [name] is a 81 y.o. male with past medical history of multiple myeloma and squamous cell carcinoma of the throat, receiving XRT for the latter. Patient also has HTN and GERD. Came to the hospital complaining about cough and fever. Patient reported for the past 2 days is he felt unwell while he was getting his radiation treatment, he started to have some cough yesterday and had a fever of 102.9 earlier today at home. Secondary to the hospital for further evaluation. In the ED he is afebrile but just x-ray showed right middle lobe infiltrates, admitted to the hospital for further evaluation.  Review of Systems:  Constitutional: negative for anorexia, fevers and sweats Eyes: negative for irritation, redness and visual disturbance Ears, nose, mouth, throat, and face: negative for earaches, epistaxis, nasal congestion and sore throat RespiCough and sputum production Cardiovascular: negative for chest pain, dyspnea, lower extremity edema, orthopnea, palpitations and syncope Gastrointestinal: negative for abdominal pain, constipation, diarrhea, melena, nausea and vomiting Genitourinary:negative for dysuria, frequency and hematuria Hematologic/lymphatic: negative for bleeding, easy bruising and lymphadenopathy Musculoskeletal:negative for arthralgias, muscle weakness and stiff joints Neurological: negative for coordination problems, gait problems, headaches and weakness Endocrine: negative for diabetic symptoms including polydipsia, polyuria and weight loss Allergic/Immunologic: negative for anaphylaxis, hay fever and urticaria  Past Medical History  Diagnosis Date  . Peripheral vascular disease ([place])   . Transient global amnesia   . Hypercholesterolemia   . Mesenteric thrombosis ([place])     post colon  resection  . Hypertension   . Macrocytic anemia   . Glaucoma   . Osteoporosis   . GERD (gastroesophageal reflux disease)   . Cholelithiasis   . Shingles 2009  . Pneumonia 02-2011    left lower lobe  . CAD (coronary artery disease)   . Colon cancer ([place])     Multiple Mylemona  . Stroke [facility])     'Mini Stroke"  . Dementia   . Skin cancer     ears  . Peripheral arterial disease ([place])   . Shortness of breath dyspnea     with exertion  . Arthritis   . Restless leg syndrome   . Squamous cell carcinoma of larynx ([place]) 06/05/2015  . Hearing loss    Past Surgical History  Procedure Laterality Date  . Laminectomies      multiple  . Exploratory laparotomy      treat twisted intestines  . Colectomy      to remove adenoma  . Femoral artery stenting    . Back surgery      lower back  . Endarterectomy femoral Left 05/02/2014    Procedure: LEFT FEMORAL ENDARTERECTOMY ;  Surgeon: Rosetta Posner, MD;  Location: [place];  Service: Vascular;  Laterality: Left;  . Abdominal aortagram N/A 03/28/2014    Procedure: ABDOMINAL AORTAGRAM;  Surgeon: Rosetta Posner, MD;  Location: [facility] CATH LAB;  Service: Cardiovascular;  Laterality: N/A;  . Eye surgery Left     cataract extraction  . Microlaryngoscopy N/A 05/28/2015    Procedure: MICROLARYNGOSCOPY/ WITH BIOPSY;  Surgeon: Beverly Gust, MD;  Location: [facility] ORS;  Service: ENT;  Laterality: N/A;   Social History:   reports that he quit smoking about 26 years ago. His smoking use included Cigarettes. His smokeless tobacco use includes Chew. He reports that he does not drink alcohol or  use illicit drugs.  No Known Allergies  Family History  Problem Relation Age of Onset  . Diabetes Mother   . Heart disease Mother   . Hypertension Mother   . Heart disease Father   . Hypertension Father   . Diabetes Father   . Hyperlipidemia Father   . Peripheral vascular disease Father   . Cancer Brother   . Diabetes Brother   . Hypertension Brother   . Heart  disease Brother     before age 35  . Cancer Sister      Prior to Admission medications   Medication Sig Start Date End Date Taking? Authorizing Provider  aspirin EC 81 MG tablet Take 81 mg by mouth at bedtime.    Yes Historical Provider, MD  clonazePAM (KLONOPIN) 0.5 MG tablet Take 0.5 mg by mouth at bedtime.    Yes Historical Provider, MD  esomeprazole (NEXIUM 24HR) 20 MG capsule Take 20 mg by mouth every morning.   Yes Historical Provider, MD  hydrochlorothiazide (HYDRODIURIL) 25 MG tablet Take 25 mg by mouth daily.   Yes Historical Provider, MD  latanoprost (XALATAN) 0.005 % ophthalmic solution Place 1 drop into both eyes at bedtime.  03/09/14  Yes Historical Provider, MD  lidocaine (XYLOCAINE) 2 % solution Patient: Mix 1part 2% viscous lidocaine, 1part H20. Swish and/or swallow 26mL of this mixture, 68min before meals and at bedtime, up to QID 08/19/15  Yes Eppie Gibson, MD  lisinopril (PRINIVIL,ZESTRIL) 5 MG tablet Take 5 mg by mouth at bedtime.    Yes Historical Provider, MD  pregabalin (LYRICA) 75 MG capsule Take 75 mg by mouth daily. He is taking two 75 mg tablets at bedtime, and is supposed to let Dr. Inda Merlin know if it is working.   Yes Historical Provider, MD  sucralfate (CARAFATE) 1 G tablet Dissolve 1 tablet in 10 mL H20 and swallow up to QID for throat soreness. 09/02/15  Yes Eppie Gibson, MD  Timolol Maleate PF 0.5 % SOLN Place 1 drop into both eyes every morning.    Yes Historical Provider, MD  traMADol (ULTRAM) 50 MG tablet Take 1 tablet (50 mg total) by mouth every 6 (six) hours as needed. 05/03/14  Yes Samantha J Rhyne, PA-C  HYDROcodone-acetaminophen (NORCO) 5-325 MG tablet Take 1 tablet by mouth every 4 (four) hours as needed. Patient not taking: Reported on 09/02/2015 08/24/15   Daleen Bo, MD   Physical Exam: Filed Vitals:   09/08/15 1035 09/08/15 1237  BP: 116/53 121/77  Pulse: 82 85  Temp: 98.6 F (37 C) 98.5 F (36.9 C)  Resp: 18 17   Constitutional: Oriented to  person, place, and time. Well-developed and well-nourished. Cooperative.  Head: Normocephalic and atraumatic.  Nose: Nose normal.  Mouth/Throat: Uvula is midline, oropharynx is clear and moist and mucous membranes are normal.  Eyes: Conjunctivae and EOM are normal. Pupils are equal, round, and reactive to light.  Neck: Trachea normal and normal range of motion. Neck supple.  Cardiovascular: Normal rate, regular rhythm, S1 normal, S2 normal, normal heart sounds and intact distal pulses.   Pulmonary/Chest: Effort normal and breath sounds normal.  Abdominal: Soft. Bowel sounds are normal. There is no hepatosplenomegaly. There is no tenderness.  Musculoskeletal: Normal range of motion.  Neurological: Alert and oriented to person, place, and time. Has normal strength. No cranial nerve deficit or sensory deficit.  Skin: Skin is warm, dry and intact.  Psychiatric: Has a normal mood and affect. Speech is normal and behavior is normal.  Labs on Admission:  Basic Metabolic Panel:  Recent Labs Lab 09/08/15 1041  NA 137  K 3.7  CL 98*  CO2 28  GLUCOSE 106*  BUN 18  CREATININE 1.21  CALCIUM 10.3   Liver Function Tests:  Recent Labs Lab 09/08/15 1041  AST 16  ALT 12*  ALKPHOS 49  BILITOT 0.7  PROT 7.8  ALBUMIN 2.5*   No results for input(s): LIPASE, AMYLASE in the last 168 hours. No results for input(s): AMMONIA in the last 168 hours. CBC:  Recent Labs Lab 09/08/15 1041  WBC 2.6*  NEUTROABS 2.0  HGB 7.8*  HCT 23.4*  MCV 109.9*  PLT 151   Cardiac Enzymes: No results for input(s): CKTOTAL, CKMB, CKMBINDEX, TROPONINI in the last 168 hours.  BNP (last 3 results) No results for input(s): BNP in the last 8760 hours.  ProBNP (last 3 results) No results for input(s): PROBNP in the last 8760 hours.  CBG: No results for input(s): GLUCAP in the last 168 hours.  Radiological Exams on Admission: Dg Chest 2 View  09/08/2015  CLINICAL DATA:  Laryngeal carcinoma.  Cough and  confusion.  Fever. EXAM: CHEST  2 VIEW COMPARISON:  Chest radiograph April 19, 2014 and chest CT June 07, 2015 FINDINGS: There is right middle lobe consolidation with volume loss, much better seen on the lateral view. There is a small granuloma in the right upper lobe. Lungs elsewhere clear. Heart size and pulmonary vascularity are normal. No adenopathy by size criteria appreciated radiographically. There is stable marked collapse of a mid thoracic vertebral body, stable. IMPRESSION: Consolidation in a portion of the right middle lobe with volume loss, much better seen on lateral view. Heart size within normal limits. Stable collapse of a mid thoracic vertebral body. Electronically Signed   By: Lowella Grip III M.D.   On: 09/08/2015 11:13   Dg Foot Complete Left  09/08/2015  CLINICAL DATA:  Pain and bruising for a couple of weeks no known injury EXAM: LEFT FOOT - COMPLETE 3+ VIEW COMPARISON:  None. FINDINGS: Three views of the left foot submitted. No acute fracture or subluxation. No periosteal reaction or bony erosion. No radiopaque foreign body. IMPRESSION: Negative. Electronically Signed   By: Lahoma Crocker M.D.   On: 09/08/2015 11:11    EKG: Independently reviewed.   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 Started on vancomycin and ceftazidime. I have asked the daughter they denies any symptoms or signs of aspiration. Patient does not cough while he eating, does not need to clear his throat frequently. Because of the high risk of aspiration with any throat XRT I'll get SLP to evaluate him. Continue supportive management with bronchodilators, mucolytics, antitussives and oxygen as needed.  Anemia Macrocytic anemia with hemoglobin of 7.8 and MCV of 109. This is  could be secondary to multiple myeloma. Obtain anemia panel, repeat CBC in a.m., if it consistently below 8 we'll transfuse 2 packs of RBCs.  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 [name] Moos. We'll notify him in a.m. for XRT therapy.  Multiple myeloma Patient follows with Dr. Julien Nordmann, office notes reviewed, treatment after glottis carcinoma treatment.    Code Status: Full code Family Communication: Plan discussed with the patient in the presence of his daughter  at bedside.  Disposition Plan: Med-Surg  Time spent: 70 minutes  Emanual Lamountain A, MD [facility] Pager [phone]

## 2015-09-08 NOTE — ED Notes (Signed)
Pt has throat cancer.  Only on radiation.  Family has noticed wet cough since yesterday with confusion x 2 days.  Woke up this morning with a fever of 102.9 at home.  Family states they gave him (2) 500 mg tabs this morning 0800. Pt A&O x 4.  Denies pain.  Denies NVD.

## 2015-09-08 NOTE — ED Notes (Signed)
Daughter states that pt has been c/o rt foot pain for the past 2 days.

## 2015-09-08 NOTE — Progress Notes (Signed)
ANTIBIOTIC CONSULT NOTE - INITIAL  Pharmacy Consult for Vancomycin, Ceftazidime Indication: pneumonia  No Known Allergies  Patient Measurements:    Vital Signs: Temp: 98.6 F (37 C) (12/11 1035) Temp Source: Oral (12/11 1035) BP: 116/53 mmHg (12/11 1035) Pulse Rate: 82 (12/11 1035) Intake/Output from previous day:   Intake/Output from this shift:    Labs:  Recent Labs  09/08/15 1041  WBC 2.6*  HGB 7.8*  PLT 151  CREATININE 1.21   Estimated Creatinine Clearance: 41.7 mL/min (by C-G formula based on Cr of 1.21). No results for input(s): VANCOTROUGH, VANCOPEAK, VANCORANDOM, GENTTROUGH, GENTPEAK, GENTRANDOM, TOBRATROUGH, TOBRAPEAK, TOBRARND, AMIKACINPEAK, AMIKACINTROU, AMIKACIN in the last 72 hours.   Microbiology: No results found for this or any previous visit (from the past 720 hour(s)).  Medical History: Past Medical History  Diagnosis Date  . Peripheral vascular disease ([place])   . Transient global amnesia   . Hypercholesterolemia   . Mesenteric thrombosis ([place])     post colon resection  . Hypertension   . Macrocytic anemia   . Glaucoma   . Osteoporosis   . GERD (gastroesophageal reflux disease)   . Cholelithiasis   . Shingles 2009  . Pneumonia 02-2011    left lower lobe  . CAD (coronary artery disease)   . Colon cancer ([place])     Multiple Mylemona  . Stroke [facility])     'Mini Stroke"  . Dementia   . Skin cancer     ears  . Peripheral arterial disease ([place])   . Shortness of breath dyspnea     with exertion  . Arthritis   . Restless leg syndrome   . Squamous cell carcinoma of larynx ([place]) 06/05/2015  . Hearing loss     Medications:  Anti-infectives    Start     Dose/Rate Route Frequency Ordered Stop   09/08/15 1400  cefTAZidime (FORTAZ) 2 g in dextrose 5 % 50 mL IVPB  Status:  Discontinued     2 g 100 mL/hr over 30 Minutes Intravenous 3 times per day 09/08/15 1122 09/08/15 1133   09/08/15 1230  vancomycin (VANCOCIN) IVPB 1000 mg/200 mL premix     1,000 mg 200 mL/hr over 60 Minutes Intravenous  Once 09/08/15 1133     09/08/15 1200  cefTAZidime (FORTAZ) 1 g in dextrose 5 % 50 mL IVPB     1 g 100 mL/hr over 30 Minutes Intravenous Every 8 hours 09/08/15 1133       Assessment: 81 y.o. male with PMH MM and sq cell carcinoma of vocal cord on XRT presents 09/08/2015 with cough and congestion over past few days and new fever this AM to 103.  RML consolidation noted on CXR and pharmacy consulted to dose vancomycin and ceftazidime to r/o PNA  12/11 >> vancomycin >> 12/11 >> ceftazidime >>    12/11 blood: IP 12/11 urine: IP  Today, 09/08/2015: Tmax afebrile since presentation WBC low Renal: SCr sl above baseline; CrCl 42 CG LA unremarkable   Goal of Therapy:  Vancomycin trough level 15-20 mcg/ml  Eradication of infection Appropriate antibiotic dosing for indication and renal function  Plan:  Day 1 antibiotics Vancomycin 1000 mg IV now, then 500 mg IV q12 hr Measure vancomycin trough levels at steady state as indicated Ceftazidime 1g IV q8 hr  Follow clinical course, renal function, culture results as available  Follow for de-escalation of antibiotics and LOT   Reuel Boom, PharmD, BCPS Pager: [phone] 09/08/2015, 12:22 PM

## 2015-09-09 ENCOUNTER — Ambulatory Visit
Admission: RE | Admit: 2015-09-09 | Discharge: 2015-09-09 | Disposition: A | Discharge status: Home / Self Care | Payer: PPO | Source: Ambulatory Visit | Attending: Radiation Oncology | Admitting: Radiation Oncology

## 2015-09-09 ENCOUNTER — Ambulatory Visit: Payer: PPO

## 2015-09-09 ENCOUNTER — Encounter (HOSPITAL_COMMUNITY): Payer: Self-pay | Admitting: *Deleted

## 2015-09-09 ENCOUNTER — Encounter: Payer: Self-pay | Admitting: Radiation Oncology

## 2015-09-09 ENCOUNTER — Encounter: Payer: Self-pay | Admitting: *Deleted

## 2015-09-09 VITALS — BP 132/90 | HR 79 | Temp 97.7°F | Ht 66.0 in

## 2015-09-09 DIAGNOSIS — C320 Malignant neoplasm of glottis: Secondary | ICD-10-CM

## 2015-09-09 DIAGNOSIS — Z510 Encounter for antineoplastic radiation therapy: Secondary | ICD-10-CM | POA: Diagnosis not present

## 2015-09-09 LAB — CBC
HEMATOCRIT: 20 % — AB (ref 39.0–52.0)
HEMOGLOBIN: 6.7 g/dL — AB (ref 13.0–17.0)
MCH: 36.6 pg — ABNORMAL HIGH (ref 26.0–34.0)
MCHC: 33.5 g/dL (ref 30.0–36.0)
MCV: 109.3 fL — AB (ref 78.0–100.0)
Platelets: 108 10*3/uL — ABNORMAL LOW (ref 150–400)
RBC: 1.83 MIL/uL — ABNORMAL LOW (ref 4.22–5.81)
RDW: 15.9 % — ABNORMAL HIGH (ref 11.5–15.5)
WBC: 3.3 10*3/uL — AB (ref 4.0–10.5)

## 2015-09-09 LAB — BASIC METABOLIC PANEL
ANION GAP: 8 (ref 5–15)
BUN: 17 mg/dL (ref 6–20)
CHLORIDE: 101 mmol/L (ref 101–111)
CO2: 27 mmol/L (ref 22–32)
Calcium: 9.3 mg/dL (ref 8.9–10.3)
Creatinine, Ser: 1.04 mg/dL (ref 0.61–1.24)
GFR calc Af Amer: >60 mL/min (ref >60)
GLUCOSE: 107 mg/dL — AB (ref 65–99)
POTASSIUM: 3.9 mmol/L (ref 3.5–5.1)
Sodium: 136 mmol/L (ref 135–145)

## 2015-09-09 LAB — HEMOGLOBIN A1C
Hgb A1c MFr Bld: 6.2 % — ABNORMAL HIGH (ref 4.8–5.6)
MEAN PLASMA GLUCOSE: 131 mg/dL

## 2015-09-09 LAB — URINE CULTURE

## 2015-09-09 LAB — ABO/RH: ABO/RH(D): A POS

## 2015-09-09 LAB — PREPARE RBC (CROSSMATCH)

## 2015-09-09 MED ORDER — ACETAMINOPHEN 325 MG PO TABS
650.0000 mg | ORAL_TABLET | Freq: Once | ORAL | Status: AC
  Administered 2015-09-09: 650 mg via ORAL
  Filled 2015-09-09: qty 2

## 2015-09-09 MED ORDER — SODIUM CHLORIDE 0.9 % IV SOLN
Freq: Once | INTRAVENOUS | Status: AC
  Administered 2015-09-09: 07:00:00 via INTRAVENOUS

## 2015-09-09 MED ORDER — FUROSEMIDE 10 MG/ML IJ SOLN
20.0000 mg | Freq: Once | INTRAMUSCULAR | Status: AC
  Administered 2015-09-09: 20 mg via INTRAVENOUS
  Filled 2015-09-09: qty 2

## 2015-09-09 NOTE — Progress Notes (Addendum)
.  CRITICAL VALUE ALERT  Critical value received:  Hgb 6.7   Date of notification:  09/09/15  Time of notification:  0510  Critical value read back:Yes.    Nurse who received alert:  Philemon Kingdom D   MD notified (1st page):  PA L Hurduk  Time of first page:  0515  MD notified (2nd page):  Time of second page:  Responding MD:    Time MD responded:

## 2015-09-09 NOTE — Progress Notes (Signed)
  Oncology Nurse Navigator Documentation   Navigator Encounter Type: Other (09/09/15 0925) Patient Visit Type: Inpatient (09/09/15 [id number])       Interventions: Other;Coordination of Care (09/09/15 0925)     Checked on patient well-being in [place] where he was admitted yesterday w/ dx of PNA and anemia.  His wife, dtr and son-in-law were at the bedside.  Spoke with his RN Jenny Reichmann to coordinate initiation of PRBC transfusion after his 10:20 Tomo tmt and WUT with Dr. [name] Moos.  I confirmed with [place] RTT Heather, she will coordinate with transporter.  Dtr asked who would be following up on surveillance of Hgb.  I offered it would be Dr. Earlie Server since he is seeing him for myeloma.  I later sent IB to  Dr. Lew Dawes RN Abelina Bachelor to confirm. I will continue to monitor during this admission.  [name], RN, BSN, [facility] [phone]           Time Spent with Patient: 30 (09/09/15 0925)

## 2015-09-09 NOTE — Progress Notes (Signed)
[facility] PROGRESS NOTE   Eric [name] [id number] DOB: August 15, 1930 DOA: 09/08/2015 PCP: Mathews Argyle, MD  HPI/Subjective: See with family at bedside, so has some cough and minimal sputum production. Hemoglobin is 6.7 this morning, transfuse 2 units of packed RBCs.  Assessment/Plan: Principal Problem:   HCAP (healthcare-associated pneumonia) Active Problems:   Multiple myeloma ([place])   Carcinoma, glottis ([place])   Anemia   HCAP Healthcare associated pneumonia, right middle lobe infiltrates with fever, cough and sputum production. Started on vancomycin and ceftazidime. I have asked the daughter they denies any symptoms or signs of aspiration. Patient does not cough while he eating, does not need to clear his throat frequently. Because of the high risk of aspiration with any throat XRT I'll get SLP to evaluate him. Continue supportive management with bronchodilators, mucolytics, antitussives and oxygen as needed.  Myelophthisic anemia Macrocytic anemia with hemoglobin of 7.8 and MCV of 109. This is could be secondary to multiple myeloma. Hemoglobin of 6.7 after fluid hydration, transfuse 2 units of packed RBCs.  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 [name] Moos. Radiation oncology notified, XRT treatment for carcinoma of the glottis.  Multiple myeloma Patient follows with Dr. Julien Nordmann, office notes reviewed, treatment after glottis carcinoma treatment.   Code Status: Full Code Family Communication: Plan discussed with the patient. Disposition Plan: Remains inpatient Diet: Diet regular Room service appropriate?: Yes; Fluid consistency:: Thin  Consultants:  Rad Onc  Procedures:  None  Antibiotics:  None   Objective: Filed Vitals:   09/08/15 2038 09/09/15 0559  BP: 100/40 97/49  Pulse: 94 86  Temp: 98.9 F (37.2 C) 98.6 F (37 C)  Resp: 17 16    Intake/Output Summary (Last 24 hours) at  09/09/15 1135 Last data filed at 09/09/15 0949  Gross per 24 hour  Intake 3048.75 ml  Output   1010 ml  Net 2038.75 ml   Filed Weights   09/08/15 1355  Weight: 65.034 kg (143 lb 6 oz)    Exam: General: Alert and awake, oriented x3, not in any acute distress. HEENT: anicteric sclera, pupils reactive to light and accommodation, EOMI CVS: S1-S2 clear, no murmur rubs or gallops Chest: clear to auscultation bilaterally, no wheezing, rales or rhonchi Abdomen: soft nontender, nondistended, normal bowel sounds, no organomegaly Extremities: no cyanosis, clubbing or edema noted bilaterally Neuro: Cranial nerves II-XII intact, no focal neurological deficits  Data Reviewed: Basic Metabolic Panel:  Recent Labs Lab 09/08/15 1041 09/09/15 0420  NA 137 136  K 3.7 3.9  CL 98* 101  CO2 28 27  GLUCOSE 106* 107*  BUN 18 17  CREATININE 1.21 1.04  CALCIUM 10.3 9.3   Liver Function Tests:  Recent Labs Lab 09/08/15 1041  AST 16  ALT 12*  ALKPHOS 49  BILITOT 0.7  PROT 7.8  ALBUMIN 2.5*   No results for input(s): LIPASE, AMYLASE in the last 168 hours. No results for input(s): AMMONIA in the last 168 hours. CBC:  Recent Labs Lab 09/08/15 1041 09/09/15 0420  WBC 2.6* 3.3*  NEUTROABS 2.0  --   HGB 7.8* 6.7*  HCT 23.4* 20.0*  MCV 109.9* 109.3*  PLT 151 108*   Cardiac Enzymes: No results for input(s): CKTOTAL, CKMB, CKMBINDEX, TROPONINI in the last 168 hours. BNP (last 3 results) No results for input(s): BNP in the last 8760 hours.  ProBNP (last 3 results) No results for input(s): PROBNP in the last 8760 hours.  CBG: No results for  input(s): GLUCAP in the last 168 hours.  Micro Recent Results (from the past 240 hour(s))  Urine culture     Status: None (Preliminary result)   Collection Time: 09/08/15 11:40 AM  Result Value Ref Range Status   Specimen Description URINE, CLEAN CATCH  Final   Special Requests NONE  Final   Culture   Final    TOO YOUNG TO  READ Performed at [facility]    Report Status PENDING  Incomplete     Studies: Dg Chest 2 View  09/08/2015  CLINICAL DATA:  Laryngeal carcinoma.  Cough and confusion.  Fever. EXAM: CHEST  2 VIEW COMPARISON:  Chest radiograph April 19, 2014 and chest CT June 07, 2015 FINDINGS: There is right middle lobe consolidation with volume loss, much better seen on the lateral view. There is a small granuloma in the right upper lobe. Lungs elsewhere clear. Heart size and pulmonary vascularity are normal. No adenopathy by size criteria appreciated radiographically. There is stable marked collapse of a mid thoracic vertebral body, stable. IMPRESSION: Consolidation in a portion of the right middle lobe with volume loss, much better seen on lateral view. Heart size within normal limits. Stable collapse of a mid thoracic vertebral body. Electronically Signed   By: Lowella Grip III M.D.   On: 09/08/2015 11:13   Dg Foot Complete Left  09/08/2015  CLINICAL DATA:  Pain and bruising for a couple of weeks no known injury EXAM: LEFT FOOT - COMPLETE 3+ VIEW COMPARISON:  None. FINDINGS: Three views of the left foot submitted. No acute fracture or subluxation. No periosteal reaction or bony erosion. No radiopaque foreign body. IMPRESSION: Negative. Electronically Signed   By: Lahoma Crocker M.D.   On: 09/08/2015 11:11    Scheduled Meds: . acetaminophen  650 mg Oral Once  . aspirin EC  81 mg Oral QHS  . cefTAZidime (FORTAZ)  IV  1 g Intravenous Q8H  . clonazePAM  0.5 mg Oral QHS  . enoxaparin (LOVENOX) injection  40 mg Subcutaneous Q24H  . furosemide  20 mg Intravenous Once  . guaiFENesin  1,200 mg Oral BID  . ipratropium-albuterol  3 mL Nebulization TID  . latanoprost  1 drop Both Eyes QHS  . pantoprazole  40 mg Oral Daily  . pregabalin  75 mg Oral Daily  . timolol  1 drop Both Eyes q morning - 10a  . vancomycin  500 mg Intravenous Q12H   Continuous Infusions: . sodium chloride 75 mL/hr at 09/09/15  0710       Time spent: 35 minutes    [facility]  [facility] Pager [phone] If 7PM-7AM, please contact night-coverage at [email or web address], password [facility] 09/09/2015, 11:35 AM  LOS: 1 day

## 2015-09-09 NOTE — Evaluation (Signed)
Clinical/Bedside Swallow Evaluation Patient Details  Name: Eric [name] MRN: [id number] Date of Birth: 09-14-1930  Today's Date: 09/09/2015 Time: SLP Start Time (ACUTE ONLY): 47 SLP Stop Time (ACUTE ONLY): 1017 SLP Time Calculation (min) (ACUTE ONLY): 11 min  Past Medical History:  Past Medical History  Diagnosis Date  . Peripheral vascular disease ([place])   . Transient global amnesia   . Hypercholesterolemia   . 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 Hypertension   . Macrocytic anemia   . Glaucoma   . Osteoporosis   . GERD (gastroesophageal reflux disease)   . Cholelithiasis   . Shingles 2009  . Pneumonia 02-2011    left lower lobe  . CAD (coronary artery disease)   . Colon cancer ([place])     Multiple Mylemona  . Stroke [facility])     'Mini Stroke"  . Dementia   . Skin cancer     ears  . Peripheral arterial disease ([place])   . Shortness of breath dyspnea     with exertion  . Arthritis   . Restless leg syndrome   . 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 Hearing loss    Past Surgical History:  Past Surgical History  Procedure Laterality Date  . Laminectomies      multiple  . Exploratory laparotomy      treat twisted intestines  . Colectomy      to remove adenoma  . Femoral artery stenting    . Back surgery      lower back  . Endarterectomy femoral Left 05/02/2014    Procedure: LEFT FEMORAL ENDARTERECTOMY ;  Surgeon: Rosetta Posner, MD;  Location: [place];  Service: Vascular;  Laterality: Left;  . Abdominal aortagram N/A 03/28/2014    Procedure: ABDOMINAL AORTAGRAM;  Surgeon: Rosetta Posner, MD;  Location: [facility] CATH LAB;  Service: Cardiovascular;  Laterality: N/A;  . Eye surgery Left     cataract extraction  . Microlaryngoscopy N/A 05/28/2015    Procedure: MICROLARYNGOSCOPY/ WITH BIOPSY;  Surgeon: Beverly Gust, MD;  Location: [facility] ORS;  Service: ENT;  Laterality: N/A;   HPI:  81 y.o. 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 Came to the hospital complaining about cough and fever. CXR showed right middle lobe infiltrates. Patient previously evaluated by OP SLP who reported a mild dysphagia, provided patient with HEP to maintain strength of musculature during radiation treatment.    Assessment / Plan / Recommendation Clinical Impression  Bedside swallow evaluation complete. Results consistent with those of most recent swallow evaluation complete at OP clinic in which patient presented with a functional oropharyngeal swallow with seemingly timely swallow and good movement of hyolaryngeal mechanism. No overt s/s of aspiration noted although patient with suspected decreased glottal closure given location of tumor and hoarse vocal quality which have the potential to impact airway protection. In light of multiple risk factors for aspiration including radiation treatment and acute diagnosis of right sided PNA, recommend completing instrumental testing while inpatient to establish baseline and determine least restrictive diet in preventing future PNAs. Discussed with patient and family who are in agreement. Patient has radiation treatment today and will be receiving blood following likely to result in increased fatigue. Will plan for MBS in am 12/13. Patient safe to continue of regular diet at this time with aspiration precautions.  Aspiration Risk  Moderate aspiration risk    Diet Recommendation   Regular, thin liquid Small bites and sips   Medication Administration: Whole meds with liquid    Other  Recommendations Oral Care Recommendations: Oral care BID   Follow up Recommendations  Outpatient SLP               Swallow Study   General HPI: 81 y.o. 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 Came to the hospital complaining about cough and fever. CXR showed right middle lobe infiltrates. Patient previously evaluated by OP SLP who reported a mild dysphagia, provided patient with HEP to maintain strength of musculature during radiation treatment.  Type of Study: Bedside Swallow Evaluation Previous Swallow Assessment: see HPI Diet Prior to this Study: Regular;Thin liquids Temperature Spikes Noted: No Respiratory Status: Room air History of Recent Intubation: No Behavior/Cognition: Alert;Cooperative;Pleasant mood Oral Cavity Assessment: Within Functional Limits Oral Care Completed by SLP: No Oral Cavity - Dentition: Adequate natural dentition Vision: Functional for self-feeding Self-Feeding Abilities: Able to feed self Patient Positioning: Upright in bed Baseline Vocal Quality: Hoarse (mildly wet) Volitional Cough: Strong Volitional Swallow: Able to elicit    Oral/Motor/Sensory Function Overall Oral Motor/Sensory Function: Within functional limits   Ice Chips Ice chips: Not tested   Thin Liquid Thin Liquid: Within functional limits Presentation: Cup;Self Fed;Straw    Nectar Thick Nectar Thick Liquid: Not tested   Honey Thick Honey Thick Liquid: Not tested   Puree Puree: Not tested   Solid Solid: Within functional limits Presentation: [place], CCC-SLP [phone]  Sharaine Delange Meryl 09/09/2015,10:26 AM

## 2015-09-09 NOTE — Progress Notes (Signed)
Mr. Guzzardo presents today for his 20th fraction of radiation to his Larynx. He is currently in the hospital for pneumonia and anemia (hgb 6.7 this am). He is receiving IV antibiotics and will receive 2 units of PRBC's today. He reports he is eating well. He does have oxygen on currently for oxygen saturations dropping while he was sleeping. He had a nebulizer treatment this morning and his oxygen level was 99%. He reports otherwise he is feeling well. His mouth is clear, and the skin on his throat is normal in color.   BP 132/90 mmHg  Pulse 79  Temp(Src) 97.7 F (36.5 C)  Ht 5\' 6"  (1.676 m)   Wt Readings from Last 3 Encounters:  09/08/15 143 lb 6 oz (65.034 kg)  09/02/15 143 lb 9.6 oz (65.137 kg)  08/28/15 144 lb 3.2 oz (65.409 kg)

## 2015-09-09 NOTE — Progress Notes (Signed)
[facility] Therapy Treatment Record Phone [phone]   Radiation Therapy was administered to Eric [name] on: 09/09/2015  10:50 AM and was treatment # 20 out of a planned course of 30 treatments.

## 2015-09-09 NOTE — Progress Notes (Addendum)
   Weekly Management Note:  Inpatient     ICD-9-CM ICD-10-CM   1. Carcinoma, glottis (HCC) 161.0 C32.0         Current Dose:  40 Gy  Projected Dose: 70 Gy   Narrative:  The patient presents for routine under treatment assessment.  CBCT/MVCT images/Port film x-rays were reviewed.  The chart was checked.   Mr. Eisenreich presents today for his 20th fraction of radiation to his Larynx. He is currently in the hospital for pneumonia and anemia (hgb 6.7 this am). He is receiving IV antibiotics and will receive 2 units of PRBC's today. He reports he is eating well. He does have oxygen on currently for oxygen saturations dropping while he was sleeping. He had a nebulizer treatment this morning and his oxygen level was 99%. He reports otherwise he is feeling well.  No significant odynophagia.  Daughter and wife here todfay.    Wt Readings from Last 3 Encounters:  09/08/15 143 lb 6 oz (65.034 kg)  09/02/15 143 lb 9.6 oz (65.137 kg)  08/28/15 144 lb 3.2 oz (65.409 kg)   Physical Findings:  height is 5\' 6"  (1.676 m) and weight is 143 lb 6 oz (65.034 kg). His oral temperature is 98.6 F (37 C). His blood pressure is 97/49 and his pulse is 86. His respiration is 16 and oxygen saturation is 100%.   Wt Readings from Last 3 Encounters:  09/08/15 143 lb 6 oz (65.034 kg)  09/02/15 143 lb 9.6 oz (65.137 kg)  08/28/15 144 lb 3.2 oz (65.409 kg)   Oral cavity and oropharynx without mucositis Skin is intact over neck  Impression:  The patient is tolerating radiotherapy.  Plan:  Continue radiotherapy as planned.   Continue supportive inpatient care  SLP outpatient appt cancelled today (but he is seeing inpatient SLP) ________________________________   Eppie Gibson, M.D.

## 2015-09-10 ENCOUNTER — Encounter: Payer: Self-pay | Admitting: Radiation Oncology

## 2015-09-10 ENCOUNTER — Inpatient Hospital Stay (HOSPITAL_COMMUNITY): Payer: PPO

## 2015-09-10 ENCOUNTER — Encounter: Payer: Self-pay | Admitting: *Deleted

## 2015-09-10 ENCOUNTER — Ambulatory Visit
Admission: RE | Admit: 2015-09-10 | Discharge: 2015-09-10 | Disposition: A | Discharge status: Home / Self Care | Payer: PPO | Source: Ambulatory Visit | Attending: Radiation Oncology | Admitting: Radiation Oncology

## 2015-09-10 DIAGNOSIS — Z510 Encounter for antineoplastic radiation therapy: Secondary | ICD-10-CM | POA: Diagnosis not present

## 2015-09-10 LAB — CBC
HCT: 25.6 % — ABNORMAL LOW (ref 39.0–52.0)
Hemoglobin: 8.7 g/dL — ABNORMAL LOW (ref 13.0–17.0)
MCH: 33.6 pg (ref 26.0–34.0)
MCHC: 34 g/dL (ref 30.0–36.0)
MCV: 98.8 fL (ref 78.0–100.0)
PLATELETS: UNDETERMINED 10*3/uL (ref 150–400)
RBC: 2.59 MIL/uL — ABNORMAL LOW (ref 4.22–5.81)
WBC: 4.1 10*3/uL (ref 4.0–10.5)

## 2015-09-10 LAB — TYPE AND SCREEN
ABO/RH(D): A POS
ANTIBODY SCREEN: NEGATIVE
UNIT DIVISION: 0
UNIT DIVISION: 0

## 2015-09-10 LAB — BASIC METABOLIC PANEL
Anion gap: 10 (ref 5–15)
BUN: 15 mg/dL (ref 6–20)
CALCIUM: 9.7 mg/dL (ref 8.9–10.3)
CO2: 23 mmol/L (ref 22–32)
CREATININE: 0.97 mg/dL (ref 0.61–1.24)
Chloride: 106 mmol/L (ref 101–111)
GFR calc non Af Amer: >60 mL/min (ref >60)
GLUCOSE: 116 mg/dL — AB (ref 65–99)
Potassium: 3.8 mmol/L (ref 3.5–5.1)
Sodium: 139 mmol/L (ref 135–145)

## 2015-09-10 MED ORDER — LEVOFLOXACIN 750 MG PO TABS: 750.0000 mg | ORAL_TABLET | Freq: Every day | ORAL | Status: DC

## 2015-09-10 NOTE — Progress Notes (Signed)
ANTIBIOTIC CONSULT NOTE - follow up  Pharmacy Consult for Vancomycin, Ceftazidime Indication: pneumonia  No Known Allergies  Patient Measurements: Height: 5\' 6"  (167.6 cm) Weight: 143 lb 6 oz (65.034 kg) IBW/kg (Calculated) : 63.8  Vital Signs: Temp: 98.5 F (36.9 C) (12/13 0541) Temp Source: Oral (12/13 0541) BP: 134/49 mmHg (12/13 0541) Pulse Rate: 73 (12/13 0541) Intake/Output from previous day: 12/12 0701 - 12/13 0700 In: 2892.3 [P.O.:1260; I.V.:751.3; Blood:681; IV Piggyback:200] Out: 1440 [Urine:1440] Intake/Output from this shift: Total I/O In: -  Out: 200 [Urine:200]  Labs:  Recent Labs  09/08/15 1041 09/09/15 0420 09/10/15 0443  WBC 2.6* 3.3* 4.1  HGB 7.8* 6.7* 8.7*  PLT 151 108* PLATELET CLUMPS NOTED ON SMEAR, UNABLE TO ESTIMATE  CREATININE 1.21 1.04 0.97   Estimated Creatinine Clearance: 52.1 mL/min (by C-G formula based on Cr of 0.97). No results for input(s): VANCOTROUGH, VANCOPEAK, VANCORANDOM, GENTTROUGH, GENTPEAK, GENTRANDOM, TOBRATROUGH, TOBRAPEAK, TOBRARND, AMIKACINPEAK, AMIKACINTROU, AMIKACIN in the last 72 hours.   Microbiology: Recent Results (from the past 720 hour(s))  Culture, blood (routine x 2)     Status: None (Preliminary result)   Collection Time: 09/08/15 10:41 AM  Result Value Ref Range Status   Specimen Description BLOOD RIGHT FOREARM  Final   Special Requests IN PEDIATRIC BOTTLE 3CC  Final   Culture   Final    NO GROWTH 1 DAY Performed at [facility]    Report Status PENDING  Incomplete  Culture, blood (routine x 2)     Status: None (Preliminary result)   Collection Time: 09/08/15 10:44 AM  Result Value Ref Range Status   Specimen Description RIGHT ANTECUBITAL  Final   Special Requests BOTTLES DRAWN AEROBIC AND ANAEROBIC 5CC  Final   Culture   Final    NO GROWTH 1 DAY Performed at [facility]    Report Status PENDING  Incomplete  Urine culture     Status: None   Collection Time: 09/08/15 11:40 AM   Result Value Ref Range Status   Specimen Description URINE, CLEAN CATCH  Final   Special Requests NONE  Final   Culture   Final    3,000 COLONIES/mL INSIGNIFICANT GROWTH Performed at [facility]    Report Status 09/09/2015 FINAL  Final    Medical History: Past Medical History  Diagnosis Date  . Peripheral vascular disease ([place])   . Transient global amnesia   . Hypercholesterolemia   . 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 Hypertension   . Macrocytic anemia   . Glaucoma   . Osteoporosis   . GERD (gastroesophageal reflux disease)   . Cholelithiasis   . Shingles 2009  . Pneumonia 02-2011    left lower lobe  . CAD (coronary artery disease)   . Colon cancer ([place])     Multiple Mylemona  . Stroke [facility])     'Mini Stroke"  . Dementia   . Skin cancer     ears  . Peripheral arterial disease ([place])   . Shortness of breath dyspnea     with exertion  . Arthritis   . Restless leg syndrome   . 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 Hearing loss     Medications:  Anti-infectives    Start     Dose/Rate Route Frequency Ordered Stop   09/08/15 2200  vancomycin (VANCOCIN) 500 mg in sodium chloride 0.9 % 100 mL IVPB     500 mg 100 mL/hr over  60 Minutes Intravenous Every 12 hours 09/08/15 1231     09/08/15 1400  cefTAZidime (FORTAZ) 2 g in dextrose 5 % 50 mL IVPB  Status:  Discontinued     2 g 100 mL/hr over 30 Minutes Intravenous 3 times per day 09/08/15 1122 09/08/15 1133   09/08/15 1230  vancomycin (VANCOCIN) IVPB 1000 mg/200 mL premix     1,000 mg 200 mL/hr over 60 Minutes Intravenous  Once 09/08/15 1133 09/08/15 1315   09/08/15 1200  cefTAZidime (FORTAZ) 1 g in dextrose 5 % 50 mL IVPB     1 g 100 mL/hr over 30 Minutes Intravenous Every 8 hours 09/08/15 1133       Assessment: 81 y.o. male with PMH MM and sq cell carcinoma of vocal cord on XRT presents 09/08/2015 with cough and congestion over past few days and new fever this AM to  103.  RML consolidation noted on CXR and pharmacy consulted to dose vancomycin and ceftazidime to r/o PNA  12/11 >> vancomycin >> 12/11 >> ceftazidime >>    12/11 blood:  12/11 urine: IP  Today, 09/10/2015: Afeb, WBC WNL, SCr stable  Goal of Therapy:  Vancomycin trough level 15-20 mcg/ml  Eradication of infection Appropriate antibiotic dosing for indication and renal function  Plan:  Day 3 Antibiotics: 1) Continue Vancomycin 500mg  IV q12 2) Continue Ceftaz 1g IV q8 3) Will check vanc trough prior to 10pm dose tonight (prior to 6th dose) mainly due to patient's age and increased risk of drug accumulation   Adrian Saran, PharmD, BCPS Pager [phone] 09/10/2015 9:47 AM

## 2015-09-10 NOTE — Progress Notes (Signed)
Scabbed wound to left shoulder

## 2015-09-10 NOTE — Discharge Summary (Signed)
Physician Discharge Summary  Faizan Geraci [facility]:[id number] DOB: [birth year] DOA: 09/08/2015  PCP: Ginette Otto, MD  Admit date: 09/08/2015 Discharge date: 09/10/2015  Time spent: 40 minutes  Recommendations for Outpatient Follow-up:  1. Follow-up with primary care physician in one week. 2. Recommend to check CBC within 2 weeks, patient did have significant anemia likely secondary to his myeloma.   Discharge Diagnoses:  Principal Problem:   HCAP (healthcare-associated pneumonia) Active Problems:   Multiple myeloma (HCC)   Carcinoma, glottis (HCC)   Anemia   Discharge Condition: Stable  Diet recommendation: Heart healthy  Filed Weights   09/08/15 1355  Weight: 65.034 kg (143 lb 6 oz)    History of present illness:  Eric [name] is a 81 y.o. male with past medical history of multiple myeloma and squamous cell carcinoma of the throat, receiving XRT for the latter. Patient also has HTN and GERD. Came to the hospital complaining about cough and fever. Patient reported for the past 2 days is he felt unwell while he was getting his radiation treatment, he started to have some cough yesterday and had a fever of 102.9 earlier today at home. Secondary to the hospital for further evaluation. In the ED he is afebrile but just x-ray showed right middle lobe infiltrates, admitted to the hospital for further evaluation.  Hospital Course:   HCAP Healthcare associated pneumonia, right middle lobe infiltrates with fever, cough and sputum production. Started on vancomycin and ceftazidime. I have asked the daughter they denies any symptoms or signs of aspiration. Patient does not cough while he eating, does not need to clear his throat frequently. Because of the high risk of aspiration with any throat XRT I'll get SLP to evaluate him. Continue supportive management with bronchodilators, mucolytics, antitussives and oxygen as needed. Discharge patient on levofloxacin for 7 more  days.  Myelophthisic anemia Macrocytic anemia with hemoglobin of 7.8 and MCV of 109. This is could be secondary to multiple myeloma. Hemoglobin of 6.7 after fluid hydration, status post transfusion of 2 units of packed RBCs. Hemoglobin after transfusion improved to 8.7, recommend hemoglobin to be checked at least every 2 weeks.  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 Basilio Cairo. Radiation oncology notified, XRT treatment for carcinoma of the glottis. Seen by SLP in modified barium swallow study was done recommended regular diet with thin liquids.  Multiple myeloma Patient follows with Dr. Arbutus Ped, office notes reviewed, treatment after glottis carcinoma treatment.    Procedures:  Routine XRT for HEENT cancer  Consultations:  Radiation oncology  Discharge Exam: Filed Vitals:   09/10/15 1250 09/10/15 1255  BP:  136/50  Pulse: 88 86  Temp:  98.2 F (36.8 C)  Resp:  18   General: Alert and awake, oriented x3, not in any acute distress. HEENT: anicteric sclera, pupils reactive to light and accommodation, EOMI CVS: S1-S2 clear, no murmur rubs or gallops Chest: clear to auscultation bilaterally, no wheezing, rales or rhonchi Abdomen: soft nontender, nondistended, normal bowel sounds, no organomegaly Extremities: no cyanosis, clubbing or edema noted bilaterally Neuro: Cranial nerves II-XII intact, no focal neurological deficits  Discharge Instructions   Discharge Instructions    Diet - low sodium heart healthy    Complete by:  As directed      Increase activity slowly    Complete by:  As directed           Current Discharge Medication List    START taking these medications  Details  levofloxacin (LEVAQUIN) 750 MG tablet Take 1 tablet (750 mg total) by mouth daily. Qty: 7 tablet, Refills: 0      CONTINUE these medications which have NOT CHANGED   Details  aspirin EC 81 MG tablet Take 81 mg by mouth at bedtime.      clonazePAM (KLONOPIN) 0.5 MG tablet Take 0.5 mg by mouth at bedtime.     esomeprazole (NEXIUM 24HR) 20 MG capsule Take 20 mg by mouth every morning.    hydrochlorothiazide (HYDRODIURIL) 25 MG tablet Take 25 mg by mouth daily.    latanoprost (XALATAN) 0.005 % ophthalmic solution Place 1 drop into both eyes at bedtime.    Associated Diagnoses: MGUS (monoclonal gammopathy of unknown significance)    lidocaine (XYLOCAINE) 2 % solution Patient: Mix 1part 2% viscous lidocaine, 1part H20. Swish and/or swallow 38mL of this mixture, 80min before meals and at bedtime, up to QID Qty: 100 mL, Refills: 5   Associated Diagnoses: Carcinoma, glottis (HCC)    lisinopril (PRINIVIL,ZESTRIL) 5 MG tablet Take 5 mg by mouth at bedtime.     !! pregabalin (LYRICA) 75 MG capsule Take 75 mg by mouth daily. He is taking two 75 mg tablets at bedtime, and is supposed to let Dr. Inda Merlin know if it is working.    sucralfate (CARAFATE) 1 G tablet Dissolve 1 tablet in 10 mL H20 and swallow up to QID for throat soreness. Qty: 60 tablet, Refills: 5   Associated Diagnoses: Carcinoma, glottis (HCC)    Timolol Maleate PF 0.5 % SOLN Place 1 drop into both eyes every morning.     traMADol (ULTRAM) 50 MG tablet Take 1 tablet (50 mg total) by mouth every 6 (six) hours as needed. Qty: 20 tablet, Refills: 0    HYDROcodone-acetaminophen (NORCO) 5-325 MG tablet Take 1 tablet by mouth every 4 (four) hours as needed. Qty: 20 tablet, Refills: 0    !! pregabalin (LYRICA) 75 MG capsule Take 150 mg by mouth at bedtime.     !! - Potential duplicate medications found. Please discuss with provider.     No Known Allergies Follow-up Information    Follow up with Mathews Argyle, MD In 1 week.   Specialty:  Internal Medicine   Contact information:   [address] [place] [address] [phone]        The results of significant diagnostics from this hospitalization (including imaging, microbiology,  ancillary and laboratory) are listed below for reference.    Significant Diagnostic Studies: Dg Chest 2 View  09/08/2015  CLINICAL DATA:  Laryngeal carcinoma.  Cough and confusion.  Fever. EXAM: CHEST  2 VIEW COMPARISON:  Chest radiograph April 19, 2014 and chest CT June 07, 2015 FINDINGS: There is right middle lobe consolidation with volume loss, much better seen on the lateral view. There is a small granuloma in the right upper lobe. Lungs elsewhere clear. Heart size and pulmonary vascularity are normal. No adenopathy by size criteria appreciated radiographically. There is stable marked collapse of a mid thoracic vertebral body, stable. IMPRESSION: Consolidation in a portion of the right middle lobe with volume loss, much better seen on lateral view. Heart size within normal limits. Stable collapse of a mid thoracic vertebral body. Electronically Signed   By: Lowella Grip III M.D.   On: 09/08/2015 11:13   Dg Shoulder Right  08/24/2015  CLINICAL DATA:  Right shoulder pain radiating into right arm 5 days. No injury. EXAM: RIGHT SHOULDER - 2+ VIEW COMPARISON:  None. FINDINGS: Mild degenerate changes of the [facility] joint and glenohumeral joint. No acute fracture or dislocation. Degenerative changes of the spine. IMPRESSION: No acute findings. Electronically Signed   By: Elberta Fortis M.D.   On: 08/24/2015 13:18   Dg Shoulder Left  09/03/2015  CLINICAL DATA:  Multiple myeloma.  Left shoulder pain for 1 week. EXAM: LEFT SHOULDER - 2+ VIEW COMPARISON:  Left humerus radiographs from the same day. FINDINGS: Two lucencies are evident within the proximal humerus. This may be related to the multiple myeloma. The shoulder is located. Focal calcification is present at the rotator cuff insertion. The clavicle is intact. The visualized left hemithorax is clear. IMPRESSION: 1. Focal calcification at the rotator cuff insertion compatible with calcific tendinitis 2. Lucencies within the proximal humerus shaft likely  reflect multiple myeloma. Electronically Signed   By: Marin Roberts M.D.   On: 09/03/2015 12:21   Dg Humerus Left  09/03/2015  CLINICAL DATA:  History of multiple myeloma EXAM: LEFT HUMERUS - 2+ VIEW COMPARISON:  Metastatic bone survey of 03/16/2014 FINDINGS: There are now several new small radiolucencies throughout the mid to distal left humerus consistent with myelomatous lesions. No impending fracture is seen. IMPRESSION: New small radiolucencies in the mid and distal left humerus consistent with myelomatous lesions. Electronically Signed   By: Dwyane Dee M.D.   On: 09/03/2015 12:10   Dg Humerus Right  09/03/2015  CLINICAL DATA:  Multiple myeloma. EXAM: RIGHT HUMERUS - 2+ VIEW COMPARISON:  08/24/2015. FINDINGS: Very tiny punctate lucencies noted about the diaphysis of the right humerus. These findings are consistent with the patient's clinical diagnosis of multiple myeloma. No evidence of fracture. Degenerative changes right shoulder and elbow. IMPRESSION: Multiple tiny punctate lucencies noted throughout the diaphysis of the right humerus. These findings are consistent the patient's clinical diagnosis of multiple myeloma. No evidence of fracture. Electronically Signed   By: Maisie Fus  Register   On: 09/03/2015 12:09   Dg Foot Complete Left  09/08/2015  CLINICAL DATA:  Pain and bruising for a couple of weeks no known injury EXAM: LEFT FOOT - COMPLETE 3+ VIEW COMPARISON:  None. FINDINGS: Three views of the left foot submitted. No acute fracture or subluxation. No periosteal reaction or bony erosion. No radiopaque foreign body. IMPRESSION: Negative. Electronically Signed   By: Natasha Mead M.D.   On: 09/08/2015 11:11   Dg Swallowing Func-speech Pathology  09/10/2015  Objective Swallowing Evaluation:   Patient Details Name: Kerem Gilmer MRN: [id number] Date of Birth: [birth year] Today's Date: 09/10/2015 Time: SLP Start Time (ACUTE ONLY): 0840-SLP Stop Time (ACUTE ONLY): 0904 SLP Time Calculation (min)  (ACUTE ONLY): 24 min Past Medical History: Past Medical History Diagnosis Date . Peripheral vascular disease (HCC)  . Transient global amnesia  . Hypercholesterolemia  . Mesenteric thrombosis (HCC)    post colon resection . Hypertension  . Macrocytic anemia  . Glaucoma  . Osteoporosis  . GERD (gastroesophageal reflux disease)  . Cholelithiasis  . Shingles 2009 . Pneumonia 02-2011   left lower lobe . CAD (coronary artery disease)  . Colon cancer (HCC)    Multiple Mylemona . Stroke [facility])    'Mini Stroke" . Dementia  . Skin cancer    ears . Peripheral arterial disease (HCC)  . Shortness of breath dyspnea    with exertion . Arthritis  . Restless leg syndrome  . 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 Hearing loss  Past Surgical History: Past Surgical History Procedure Laterality Date . Laminectomies  multiple . Exploratory laparotomy     treat twisted intestines . Colectomy     to remove adenoma . Femoral artery stenting   . Back surgery     lower back . Endarterectomy femoral Left 05/02/2014   Procedure: LEFT FEMORAL ENDARTERECTOMY ;  Surgeon: Rosetta Posner, MD;  Location: [place];  Service: Vascular;  Laterality: Left; . Abdominal aortagram N/A 03/28/2014   Procedure: ABDOMINAL AORTAGRAM;  Surgeon: Rosetta Posner, MD;  Location: [facility] CATH LAB;  Service: Cardiovascular;  Laterality: N/A; . Eye surgery Left    cataract extraction . Microlaryngoscopy N/A 05/28/2015   Procedure: MICROLARYNGOSCOPY/ WITH BIOPSY;  Surgeon: Beverly Gust, MD;  Location: [facility] ORS;  Service: ENT;  Laterality: N/A; HPI: 81 y.o. male with past medical history of HTN, dementia, GERD, TIA?, multiple myeloma and squamous cell carcinoma of the throat (involving both vocal folds), receiving XRT for the latter. Came to the hospital complaining about cough and fever. CXR showed right middle lobe infiltrates. Patient previously evaluated by OP SLP who reported a mild dysphagia, provided patient with HEP to maintain strength of musculature during  radiation treatment.  No Data Recorded Assessment / Plan / Recommendation CHL IP CLINICAL IMPRESSIONS 09/10/2015 Therapy Diagnosis Mild pharyngeal phase dysphagia;Mild oral phase dysphagia Clinical Impression Pt presents with mild oral and oropharyngeal sensory and motor impairment in setting of laryngeal cancer and current radiation treatment. Pt demonstrates mild oral and vallecular residuals, suspect due to lingual/base of tongue weakness and decreased sensation. A cued second swallow clears residue and decreases risk of aspiration. Pt silently penetrated x1 during the study while taking large consecutive straw sips due to decreased closure of vestibule. Laryngeal closure appeared adequate. Recommend pt follow basic aspriation precautions, swallow x2 for each bite/sip and avoid straws. Pt should continue work with OP SLP for exercise program and also acute SLP will f/u to reinforce precautions.  Impact on safety and function Moderate aspiration risk   CHL IP TREATMENT RECOMMENDATION 09/09/2015 Treatment Recommendations Defer until completion of intrumental exam   Prognosis 09/10/2015 Prognosis for Safe Diet Advancement Good Barriers to Reach Goals -- Barriers/Prognosis Comment -- CHL IP DIET RECOMMENDATION 09/10/2015 SLP Diet Recommendations Regular solids;Thin liquid Liquid Administration via Cup;No straw Medication Administration Whole meds with liquid Compensations Multiple dry swallows after each bite/sip Postural Changes Seated upright at 90 degrees   CHL IP OTHER RECOMMENDATIONS 09/10/2015 Recommended Consults -- Oral Care Recommendations Oral care BID Other Recommendations --   CHL IP FOLLOW UP RECOMMENDATIONS 09/10/2015 Follow up Recommendations Outpatient SLP   CHL IP FREQUENCY AND DURATION 09/10/2015 Speech Therapy Frequency (ACUTE ONLY) min 2x/week Treatment Duration 2 weeks      CHL IP ORAL PHASE 09/10/2015 Oral Phase Impaired Oral - Pudding Teaspoon -- Oral - Pudding Cup -- Oral - Honey Teaspoon --  Oral - Honey Cup -- Oral - Nectar Teaspoon -- Oral - Nectar Cup -- Oral - Nectar Straw -- Oral - Thin Teaspoon -- Oral - Thin Cup Lingual/palatal residue Oral - Thin Straw Lingual/palatal residue Oral - Puree Lingual/palatal residue Oral - Mech Soft -- Oral - Regular Lingual/palatal residue Oral - Multi-Consistency -- Oral - Pill Lingual/palatal residue Oral Phase - Comment --  CHL IP PHARYNGEAL PHASE 09/10/2015 Pharyngeal Phase Impaired Pharyngeal- Pudding Teaspoon -- Pharyngeal -- Pharyngeal- Pudding Cup -- Pharyngeal -- Pharyngeal- Honey Teaspoon -- Pharyngeal -- Pharyngeal- Honey Cup -- Pharyngeal -- Pharyngeal- Nectar Teaspoon -- Pharyngeal -- Pharyngeal- Nectar Cup -- Pharyngeal -- Pharyngeal- Nectar Straw -- Pharyngeal -- Pharyngeal- Thin  Teaspoon -- Pharyngeal -- Pharyngeal- Thin Cup Pharyngeal residue - valleculae;Reduced tongue base retraction Pharyngeal -- Pharyngeal- Thin Straw Reduced tongue base retraction;Pharyngeal residue - valleculae;Penetration/Aspiration during swallow;Reduced epiglottic inversion Pharyngeal Material enters airway, CONTACTS cords and not ejected out;Material enters airway, CONTACTS cords and then ejected out Pharyngeal- Puree Reduced tongue base retraction;Pharyngeal residue - valleculae Pharyngeal -- Pharyngeal- Mechanical Soft -- Pharyngeal -- Pharyngeal- Regular Pharyngeal residue - valleculae;Reduced tongue base retraction Pharyngeal Material does not enter airway Pharyngeal- Multi-consistency -- Pharyngeal -- Pharyngeal- Pill -- Pharyngeal -- Pharyngeal Comment --  No flowsheet data found. Herbie [place], [place] CCC-SLP [phone] Lynann Beaver 09/10/2015, 11:37 AM               Microbiology: Recent Results (from the past 240 hour(s))  Culture, blood (routine x 2)     Status: None (Preliminary result)   Collection Time: 09/08/15 10:41 AM  Result Value Ref Range Status   Specimen Description BLOOD RIGHT FOREARM  Final   Special Requests IN PEDIATRIC BOTTLE 3CC   Final   Culture   Final    NO GROWTH 2 DAYS Performed at [facility]    Report Status PENDING  Incomplete  Culture, blood (routine x 2)     Status: None (Preliminary result)   Collection Time: 09/08/15 10:44 AM  Result Value Ref Range Status   Specimen Description RIGHT ANTECUBITAL  Final   Special Requests BOTTLES DRAWN AEROBIC AND ANAEROBIC 5CC  Final   Culture   Final    NO GROWTH 2 DAYS Performed at [facility]    Report Status PENDING  Incomplete  Urine culture     Status: None   Collection Time: 09/08/15 11:40 AM  Result Value Ref Range Status   Specimen Description URINE, CLEAN CATCH  Final   Special Requests NONE  Final   Culture   Final    3,000 COLONIES/mL INSIGNIFICANT GROWTH Performed at [facility]    Report Status 09/09/2015 FINAL  Final     Labs: Basic Metabolic Panel:  Recent Labs Lab 09/08/15 1041 09/09/15 0420 09/10/15 0443  NA 137 136 139  K 3.7 3.9 3.8  CL 98* 101 106  CO2 $Re'28 27 23  'yPR$ GLUCOSE 106* 107* 116*  BUN $Re'18 17 15  'UqE$ CREATININE 1.21 1.04 0.97  CALCIUM 10.3 9.3 9.7   Liver Function Tests:  Recent Labs Lab 09/08/15 1041  AST 16  ALT 12*  ALKPHOS 49  BILITOT 0.7  PROT 7.8  ALBUMIN 2.5*   No results for input(s): LIPASE, AMYLASE in the last 168 hours. No results for input(s): AMMONIA in the last 168 hours. CBC:  Recent Labs Lab 09/08/15 1041 09/09/15 0420 09/10/15 0443  WBC 2.6* 3.3* 4.1  NEUTROABS 2.0  --   --   HGB 7.8* 6.7* 8.7*  HCT 23.4* 20.0* 25.6*  MCV 109.9* 109.3* 98.8  PLT 151 108* PLATELET CLUMPS NOTED ON SMEAR, UNABLE TO ESTIMATE   Cardiac Enzymes: No results for input(s): CKTOTAL, CKMB, CKMBINDEX, TROPONINI in the last 168 hours. BNP: BNP (last 3 results) No results for input(s): BNP in the last 8760 hours.  ProBNP (last 3 results) No results for input(s): PROBNP in the last 8760 hours.  CBG: No results for input(s): GLUCAP in the last 168 hours.     Signed:  Martie Muhlbauer  A  [facility] 09/10/2015, 2:24 PM

## 2015-09-10 NOTE — Progress Notes (Signed)
[facility] Therapy Treatment Record Phone [phone]   Radiation Therapy was administered to Eric [name] on: 09/10/2015  10:27 AM and was treatment # 21 out of a planned course of 35 treatments.

## 2015-09-10 NOTE — Progress Notes (Signed)
MBSS complete. Full report located under chart review in imaging section. Aryelle Figg, MA CCC-SLP [phone]  

## 2015-09-10 NOTE — Progress Notes (Signed)
  Radiation Oncology         [phone] ________________________________  Name: Eric [name] MRN: [id number]  Date: 09/11/2015  DOB: 12-23-1929  SIMULATION AND TREATMENT PLANNING NOTE  Outpatient  DIAGNOSIS:     ICD-9-CM ICD-10-CM   1. Multiple myeloma not having achieved remission ([place]) 203.00 C90.00     NARRATIVE:  The patient was brought to the [place].  Identity was confirmed.  All relevant records and images related to the planned course of therapy were reviewed.  The patient freely provided informed written consent to proceed with treatment after reviewing the details related to the planned course of therapy. The consent form was witnessed and verified by the simulation staff.    Then, the patient was set-up in a stable reproducible  supine position  with the headmask that was already fabricated for his current throat radiotherapy. CT images were obtained.  Surface markings were placed.  The CT images were loaded into the planning software.    TREATMENT PLANNING NOTE: Treatment planning then occurred.  The radiation prescription was entered and confirmed.    A total of 4 medically necessary complex treatment devices were fabricated and supervised by me, in the form of 4 fields with MLCs to block skin. MORE FIELDS WITH MLCs MAY BE ADDED IN DOSIMETRY for dose homogeneity.  I have requested : Isodose Plan.   The patient will receive 8 Gy in 1 fraction to his humerii bilaterally (each with 2 opposed AP/PA fields.   -----------------------------------  Eppie Gibson, MD

## 2015-09-10 NOTE — Progress Notes (Signed)
  Oncology Nurse Navigator Documentation   Navigator Encounter Type: Other (09/10/15 1240) Patient Visit Type: Inpatient (09/10/15 1240)       Interventions: Coordination of Care (09/10/15 1240)     Visited patient [facility] [phone] to check on his well-being.  His dtr, s-in-law and wife were at the bedside. Dtr Eric [name] indicated attending MD stated Eric [name]:  Is medically stable for DC which may be as early as this afternoon pending satisfactory ambulation.  Should have his Hgb monitored every couple of weeks by his PCP, Dr. Felipa Eth, who can then order transfusions as needed.  Dtr said they would arrange this. Family verbalized understanding of tomorrow's 0900 CT SIM for bilateral humerus RT planning. They did not express any needs or concerns at this time, I encouraged them to contact me if that changes.  [name], RN, BSN, [place] at [place] [phone]            Time Spent with Patient: 15 (09/10/15 1240)

## 2015-09-10 NOTE — Progress Notes (Signed)
High fall risk

## 2015-09-10 NOTE — Progress Notes (Signed)
Patient applied for [place] and is not eligible due to income guidelines

## 2015-09-11 ENCOUNTER — Ambulatory Visit
Admission: RE | Admit: 2015-09-11 | Discharge: 2015-09-11 | Disposition: A | Discharge status: Home / Self Care | Payer: PPO | Source: Ambulatory Visit | Attending: Radiation Oncology | Admitting: Radiation Oncology

## 2015-09-11 ENCOUNTER — Encounter: Payer: Self-pay | Admitting: *Deleted

## 2015-09-11 DIAGNOSIS — C9000 Multiple myeloma not having achieved remission: Secondary | ICD-10-CM

## 2015-09-11 DIAGNOSIS — Z510 Encounter for antineoplastic radiation therapy: Secondary | ICD-10-CM | POA: Diagnosis not present

## 2015-09-11 NOTE — Progress Notes (Signed)
  Oncology Nurse Navigator Documentation   Navigator Encounter Type: Clinic/MDC (09/11/15 4403) Patient Visit Type: Radonc (09/11/15 0925) Treatment Phase: CT SIM (09/11/15 4742)     To provide support and encouragement, met with patient during CT SIM for bilateral shoulder XRT.  He was accompanied by his wife.  He managed SIM without difficulty.  They verbalized understanding shoulder XRT will be a one-time fraction, will be provided on one of the other machines following a scheduled Tomo tmt. They understand I can be contacted.  [name], RN, BSN, [place] at [place] [phone]                 Time Spent with Patient: 30 (09/11/15 0925)

## 2015-09-12 ENCOUNTER — Ambulatory Visit
Admission: RE | Admit: 2015-09-12 | Discharge: 2015-09-12 | Disposition: A | Discharge status: Home / Self Care | Payer: PPO | Source: Ambulatory Visit | Attending: Radiation Oncology | Admitting: Radiation Oncology

## 2015-09-12 DIAGNOSIS — Z510 Encounter for antineoplastic radiation therapy: Secondary | ICD-10-CM | POA: Diagnosis not present

## 2015-09-13 ENCOUNTER — Ambulatory Visit
Admission: RE | Admit: 2015-09-13 | Discharge: 2015-09-13 | Disposition: A | Discharge status: Home / Self Care | Payer: PPO | Source: Ambulatory Visit | Attending: Radiation Oncology | Admitting: Radiation Oncology

## 2015-09-13 DIAGNOSIS — Z510 Encounter for antineoplastic radiation therapy: Secondary | ICD-10-CM | POA: Diagnosis not present

## 2015-09-13 LAB — CULTURE, BLOOD (ROUTINE X 2)
CULTURE: NO GROWTH
Culture: NO GROWTH

## 2015-09-16 ENCOUNTER — Ambulatory Visit
Admission: RE | Admit: 2015-09-16 | Discharge: 2015-09-16 | Disposition: A | Discharge status: Home / Self Care | Payer: PPO | Source: Ambulatory Visit | Attending: Radiation Oncology | Admitting: Radiation Oncology

## 2015-09-16 ENCOUNTER — Encounter: Payer: Self-pay | Admitting: Radiation Oncology

## 2015-09-16 VITALS — BP 103/43 | HR 67 | Temp 97.6°F | Ht 66.0 in | Wt 140.7 lb

## 2015-09-16 DIAGNOSIS — Z510 Encounter for antineoplastic radiation therapy: Secondary | ICD-10-CM | POA: Diagnosis not present

## 2015-09-16 DIAGNOSIS — C320 Malignant neoplasm of glottis: Secondary | ICD-10-CM

## 2015-09-16 MED ORDER — SUCRALFATE 1 GM/10ML PO SUSP: 1.0000 g | Freq: Three times a day (TID) | ORAL | Status: AC

## 2015-09-16 NOTE — Progress Notes (Signed)
Eric [name] is here for his 25th fraction of radiation to his Larynx. He reports he his experiencing some fatigue and admits to napping more during the day, and sleeping longer at night. He states he is eating well, but is modifying his diet and eating softer foods. He states that when he eats it feels as if it needs to come back up because of reflex, and he denies nausea.  He states he does not drink a lot during the day, and his wife reports he is not going to the bathroom at night like he used to. I have encouraged him to try to drink more during the day if possible. They are also having trouble crushing the carafate, and have asked if he could get the liquid form of carafate if possible. The skin over his neck is red, and slightly tender, and he is using the sonafine cream as directed. His mouth is clear except for a slight yellow coating over his tongue. He does complain of pain when swallowing and takes ultram 50 mg every 4 hours at home.   BP 103/43 mmHg  Pulse 67  Temp(Src) 97.6 F (36.4 C)  Ht 5\' 6"  (1.676 m)  Wt 140 lb 11.2 oz (63.821 kg)  BMI 22.72 kg/m2  Orothostatics: BP sitting 103/43 pulse 67. BP standing 90/45 pulse 74  Wt Readings from Last 3 Encounters:  09/16/15 140 lb 11.2 oz (63.821 kg)  09/08/15 143 lb 6 oz (65.034 kg)  09/02/15 143 lb 9.6 oz (65.137 kg)

## 2015-09-16 NOTE — Progress Notes (Addendum)
    Weekly Management Note:  Outpatient       ICD-9-CM ICD-10-CM   1. 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  CBCT/MVCT images/Port film x-rays were reviewed.  The chart was checked.    Eric [name] is here for his 25th fraction of radiation to his Larynx. He reports he his experiencing some fatigue and admits to napping more during the day, and sleeping longer at night. He states he is eating well, but is modifying his diet and eating softer foods. He states that when he eats it feels as if it needs to come back up because of reflux, and he denies nausea.  He states he does not drink a lot during the day, and his wife reports he is not going to the bathroom at night like he used to.   BP 103/43 mmHg  Pulse 67  Temp(Src) 97.6 F (36.4 C)  Ht 5\' 6"  (1.676 m)  Wt 140 lb 11.2 oz (63.821 kg)  BMI 22.72 kg/m2   Orothostatics: BP sitting 103/43 pulse 67. BP standing 90/45 pulse 74   Wt Readings from Last 3 Encounters:  09/16/15 140 lb 11.2 oz (63.821 kg)  09/08/15 143 lb 6 oz (65.034 kg)  09/02/15 143 lb 9.6 oz (65.137 kg)   Physical Findings:  height is 5\' 6"  (1.676 m) and weight is 140 lb 11.2 oz (63.821 kg). His temperature is 97.6 F (36.4 C). His blood pressure is 103/43 and his pulse is 67.   Wt Readings from Last 3 Encounters:  09/16/15 140 lb 11.2 oz (63.821 kg)  09/08/15 143 lb 6 oz (65.034 kg)  09/02/15 143 lb 9.6 oz (65.137 kg)   Oral cavity and oropharynx moist, without mucositis Skin is intact over neck  Impression:  The patient is tolerating radiotherapy.  Plan:  Continue radiotherapy as planned.      Weight loss:   referral to nutrition.  Push PO hydration, soft food, shakes.  Low BP: Hold HCTZ and discuss resuming at next PCP appt in January.  Rx liquid carafate per pt  request  ________________________________   Eric [name], M.D.

## 2015-09-17 ENCOUNTER — Telehealth: Payer: Self-pay | Admitting: *Deleted

## 2015-09-17 ENCOUNTER — Telehealth: Payer: Self-pay

## 2015-09-17 ENCOUNTER — Encounter: Payer: Self-pay | Admitting: Radiation Oncology

## 2015-09-17 ENCOUNTER — Ambulatory Visit
Admission: RE | Admit: 2015-09-17 | Discharge: 2015-09-17 | Disposition: A | Discharge status: Home / Self Care | Payer: PPO | Source: Ambulatory Visit | Attending: Radiation Oncology | Admitting: Radiation Oncology

## 2015-09-17 DIAGNOSIS — Z510 Encounter for antineoplastic radiation therapy: Secondary | ICD-10-CM | POA: Diagnosis not present

## 2015-09-17 NOTE — Telephone Encounter (Signed)
I called to inform Mr. Eric [name] that his prescription for liquid sucralfate was called in to [facility] yesterday by Dr. [name] Moos. Eric [name] reported that when she called the pharmacy it had not been called in yet. I advised her to call the pharmacy again to check, as the prescription may have been called in later than when she checked. I told her to call back if she had any other difficulties.

## 2015-09-17 NOTE — Telephone Encounter (Signed)
I called and left a message with Dr. Carlyle Lipa nurse and let her know that Dr. [name] Moos is holding Mr. Alverson's HCTZ, and Dr. [name] Moos wanted to have his BP rechecked in January and decide when to resume the HCTZ.

## 2015-09-17 NOTE — Progress Notes (Signed)
Paperwork faxed for Eric [name], Eric [name] to [employer] @ [phone], confirmation # received, patient will come and pick up copies, scanned into chart Ardeen Fillers)

## 2015-09-17 NOTE — Telephone Encounter (Signed)
CALLED PATIENT TO INFORM OF NUTRITION APPT. ON 09-18-15 @ 9 AM WITH BARBARA NEFF, LVM FOR A RETURN CALL

## 2015-09-18 ENCOUNTER — Ambulatory Visit
Admission: RE | Admit: 2015-09-18 | Discharge: 2015-09-18 | Disposition: A | Discharge status: Home / Self Care | Payer: PPO | Source: Ambulatory Visit | Attending: Radiation Oncology | Admitting: Radiation Oncology

## 2015-09-18 ENCOUNTER — Ambulatory Visit: Payer: PPO | Admitting: Nutrition

## 2015-09-18 ENCOUNTER — Encounter: Payer: Self-pay | Admitting: *Deleted

## 2015-09-18 DIAGNOSIS — Z510 Encounter for antineoplastic radiation therapy: Secondary | ICD-10-CM | POA: Diagnosis not present

## 2015-09-18 NOTE — Progress Notes (Signed)
Nutrition follow-up completed with patient and his wife. Patient was diagnosed with laryngeal cancer in August 2016 and has been receiving radiation therapy. Current weight documented as 140.7 pounds December 19 down slightly from 143.1 pounds October 5. Patient is within his usual body weight range of 140-145 pounds. Patient has been following a regular diet and trying to consume softer foods. He drinks one oral nutrition supplement daily. Patient complains of constipation.  Nutrition diagnosis:  Food and nutrition related knowledge deficit improved.  Intervention:  Educated patient to continue small frequent meals and snacks focusing on high-protein, high-calorie to promote weight maintenance. Reviewed high protein foods patient could add to meals. Educated patient on continuing to modify textures of foods for easier swallowing. Recommended patient increase Ensure Plus twice a day and provided coupons and samples. Reviewed importance of increased fluid intake and provided strategies for improving constipation. Fact sheets were provided and questions were answered.  Teach back method used.  Monitoring, evaluation, goals: Patient will tolerate adequate calories and protein to minimize further weight loss.  Next visit: Friday, December 30.  **Disclaimer: This note was dictated with voice recognition software. Similar sounding words can inadvertently be transcribed and this note may contain transcription errors which may not have been corrected upon publication of note.**

## 2015-09-18 NOTE — Progress Notes (Signed)
  Oncology Nurse Navigator Documentation   Navigator Encounter Type: Treatment (09/18/15 1035) Patient Visit Type: XTAVWP (09/18/15 1035)       Interventions: Coordination of Care (09/18/15 1035)     To facilitate additional XRT session on LINAC 2, met with patient following Tomo tmt, escorted him to [facility] 2, assisted with tmt preparation.  [name], RN, BSN, [place] [facility] [place] [phone]           Time Spent with Patient: 30 (09/18/15 1035)

## 2015-09-19 ENCOUNTER — Ambulatory Visit
Admission: RE | Admit: 2015-09-19 | Discharge: 2015-09-19 | Disposition: A | Discharge status: Home / Self Care | Payer: PPO | Source: Ambulatory Visit | Attending: Radiation Oncology | Admitting: Radiation Oncology

## 2015-09-19 DIAGNOSIS — Z510 Encounter for antineoplastic radiation therapy: Secondary | ICD-10-CM | POA: Diagnosis not present

## 2015-09-20 ENCOUNTER — Ambulatory Visit
Admission: RE | Admit: 2015-09-20 | Discharge: 2015-09-20 | Disposition: A | Discharge status: Home / Self Care | Payer: PPO | Source: Ambulatory Visit | Attending: Radiation Oncology | Admitting: Radiation Oncology

## 2015-09-20 ENCOUNTER — Encounter: Payer: Self-pay | Admitting: Radiation Oncology

## 2015-09-20 VITALS — BP 118/68 | HR 69 | Temp 97.4°F | Ht 66.0 in | Wt 138.6 lb

## 2015-09-20 DIAGNOSIS — Z510 Encounter for antineoplastic radiation therapy: Secondary | ICD-10-CM | POA: Diagnosis not present

## 2015-09-20 DIAGNOSIS — C320 Malignant neoplasm of glottis: Secondary | ICD-10-CM | POA: Diagnosis present

## 2015-09-20 NOTE — Progress Notes (Signed)
Eric [name] presents for his 29th fraction of radiation to his Larynx. His reports his energy level is "way down". The skin to his bilateral neck is dry, red, and tender. He is applying sonafine to this area twice daily. His mouth is normal in appearance. He does reports some thick saliva and I have recommended the baking soda/salt rinse to help this. He is uisng Biotene rinse daily. He is eating softer foods and foods that are moist. He drinks one boost everyday to supplement his diet.   BP 108/71 mmHg  Pulse 66  Temp(Src) 97.4 F (36.3 C)  Ht 5\' 6"  (1.676 m)  Wt 138 lb 9.6 oz (62.869 kg)  BMI 22.38 kg/m2  Orthostatics: BP sitting 108/71, pulse 66. BP standing 118/68. Pulse 69  Wt Readings from Last 3 Encounters:  09/20/15 138 lb 9.6 oz (62.869 kg)  09/16/15 140 lb 11.2 oz (63.821 kg)  09/08/15 143 lb 6 oz (65.034 kg)

## 2015-09-20 NOTE — Progress Notes (Signed)
Department of Radiation Oncology  Phone:  [phone] Fax:        [phone]  Weekly Treatment Note    Name: Eric [name] Date: 09/20/2015 MRN: [id number] DOB: [birth year]   Diagnosis:     ICD-9-CM ICD-10-CM   1. Cancer of glottis (HCC) 161.0 C32.0      Current dose: 58 Gy  Current fraction:29   MEDICATIONS: Current Outpatient Prescriptions  Medication Sig Dispense Refill  . aspirin EC 81 MG tablet Take 81 mg by mouth at bedtime.     . clonazePAM (KLONOPIN) 0.5 MG tablet Take 0.5 mg by mouth at bedtime.     Marland Kitchen esomeprazole (NEXIUM 24HR) 20 MG capsule Take 20 mg by mouth every morning.    . hydrochlorothiazide (HYDRODIURIL) 25 MG tablet Take 25 mg by mouth daily.    Marland Kitchen latanoprost (XALATAN) 0.005 % ophthalmic solution Place 1 drop into both eyes at bedtime.     Marland Kitchen levofloxacin (LEVAQUIN) 750 MG tablet Take 1 tablet (750 mg total) by mouth daily. 7 tablet 0  . lidocaine (XYLOCAINE) 2 % solution Patient: Mix 1part 2% viscous lidocaine, 1part H20. Swish and/or swallow 65mL of this mixture, 83min before meals and at bedtime, up to QID 100 mL 5  . lisinopril (PRINIVIL,ZESTRIL) 5 MG tablet Take 5 mg by mouth at bedtime.     . pregabalin (LYRICA) 75 MG capsule Take 75 mg by mouth daily. He is taking two 75 mg tablets at bedtime, and is supposed to let Dr. Inda Merlin know if it is working.    . pregabalin (LYRICA) 75 MG capsule Take 150 mg by mouth at bedtime.    . sucralfate (CARAFATE) 1 GM/10ML suspension Take 10 mLs (1 g total) by mouth 4 (four) times daily -  with meals and at bedtime. 420 mL 5  . Timolol Maleate PF 0.5 % SOLN Place 1 drop into both eyes every morning.     . traMADol (ULTRAM) 50 MG tablet Take 1 tablet (50 mg total) by mouth every 6 (six) hours as needed. 20 tablet 0  . HYDROcodone-acetaminophen (NORCO) 5-325 MG tablet Take 1 tablet by mouth every 4 (four) hours as needed. (Patient not taking: Reported on 09/02/2015) 20 tablet 0   No current facility-administered  medications for this encounter.     ALLERGIES: Review of patient's allergies indicates no known allergies.   LABORATORY DATA:  Lab Results  Component Value Date   WBC 4.1 09/10/2015   HGB 8.7* 09/10/2015   HCT 25.6* 09/10/2015   MCV 98.8 09/10/2015   PLT PLATELET CLUMPS NOTED ON SMEAR, UNABLE TO ESTIMATE 09/10/2015   Lab Results  Component Value Date   NA 139 09/10/2015   K 3.8 09/10/2015   CL 106 09/10/2015   CO2 23 09/10/2015   Lab Results  Component Value Date   ALT 12* 09/08/2015   AST 16 09/08/2015   ALKPHOS 49 09/08/2015   BILITOT 0.7 09/08/2015     NARRATIVE: Eric [name] was seen today for weekly treatment management. The chart was checked and the patient's films were reviewed.  Eric [name] presents for his 29th fraction of radiation to his larynx. His reports his energy level is "way down". The skin to his bilateral neck is dry, red, and tender. He is applying sonafine to this area twice daily. His mouth is normal in appearance. He does reports some thick saliva and the nurse has recommended baking soda/salt rinse to help this. He is uisng Biotene rinse daily. He is eating  softer foods and foods that are moist. He drinks one boost everyday to supplement his diet.   PHYSICAL EXAMINATION: height is 5\' 6"  (1.676 m) and weight is 138 lb 9.6 oz (62.869 kg). His temperature is 97.4 F (36.3 C). His blood pressure is 118/68 and his pulse is 69.  The patient presents to the clinic in a wheelchair. Some erythema and hyperpigmentation present bilaterally in the neck. No significant desquamation.  ASSESSMENT: The patient is doing satisfactorily with treatment. I advised the patient to continue applying sonafine to the area.  PLAN: We will continue with the patient's radiation treatment as planned.  This document serves as a record of services personally performed by Kyung Rudd, MD. It was created on his behalf by Darcus Austin, a trained medical scribe. The creation of this record  is based on the scribe's personal observations and the provider's statements to them. This document has been checked and approved by the attending provider.

## 2015-09-24 ENCOUNTER — Ambulatory Visit
Admission: RE | Admit: 2015-09-24 | Discharge: 2015-09-24 | Disposition: A | Discharge status: Home / Self Care | Payer: PPO | Source: Ambulatory Visit | Attending: Radiation Oncology | Admitting: Radiation Oncology

## 2015-09-24 NOTE — Progress Notes (Signed)
Mr. Bielak presented after radiation treatment today concerned about several small open areas to his neck area. His neck is red, tender, with multiple dry areas noted. There are several spots that are open, which he states have been bleeding occasionally. I instructed him to use neosporin to these areas, and continue using the sonafine cream to the other areas on his neck. He asked me if he should shave, and I stated he should not shave at this time due to the open areas on his neck, and the irritation. He voiced his understanding.

## 2015-09-25 ENCOUNTER — Encounter: Payer: Self-pay | Admitting: Radiation Oncology

## 2015-09-25 ENCOUNTER — Ambulatory Visit
Admission: RE | Admit: 2015-09-25 | Discharge: 2015-09-25 | Disposition: A | Discharge status: Home / Self Care | Payer: PPO | Source: Ambulatory Visit | Attending: Radiation Oncology | Admitting: Radiation Oncology

## 2015-09-25 ENCOUNTER — Telehealth: Payer: Self-pay | Admitting: *Deleted

## 2015-09-25 ENCOUNTER — Encounter: Payer: Self-pay | Admitting: Nutrition

## 2015-09-25 VITALS — BP 153/61 | HR 72 | Temp 97.7°F | Ht 66.0 in | Wt 140.6 lb

## 2015-09-25 DIAGNOSIS — C320 Malignant neoplasm of glottis: Secondary | ICD-10-CM | POA: Insufficient documentation

## 2015-09-25 NOTE — Progress Notes (Signed)
Received phone call from patient's wife.  Patient reports he is doing well and would like to cancel nutrition appointment on Friday. I have cancelled appointment and encouraged patient's wife to contact me if needed.

## 2015-09-25 NOTE — Progress Notes (Signed)
    Weekly Management Note:  Outpatient       ICD-9-CM ICD-10-CM   1. 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  CBCT/MVCT images/Port film x-rays were reviewed.  The chart was checked.  Gaining weight.  He denies pain except tenderness and burning to the skin of his neck. He developed some open areas to his neck, which he is using neosporin for, and he states this has helped. He is also using the sonafine cream as directed.   He reports he is swallowing well. He is eating mostly softer foods, and states he drinks a boost "once in a while". He doesn't like the boost because he says it tastes like chalk. He is drinking "a fair amount of liquids".     BP 153/61 mmHg  Pulse 72  Temp(Src) 97.7 F (36.5 C)  Ht 5\' 6"  (1.676 m)  Wt 140 lb 9.6 oz (63.776 kg)  BMI 22.70 kg/m2       Wt Readings from Last 3 Encounters:  09/25/15 140 lb 9.6 oz (63.776 kg)  09/20/15 138 lb 9.6 oz (62.869 kg)  09/16/15 140 lb 11.2 oz (63.821 kg)   Physical Findings:  height is 5\' 6"  (1.676 m) and weight is 140 lb 9.6 oz (63.776 kg). His temperature is 97.7 F (36.5 C). His blood pressure is 153/61 and his pulse is 72.   Wt Readings from Last 3 Encounters:  09/25/15 140 lb 9.6 oz (63.776 kg)  09/20/15 138 lb 9.6 oz (62.869 kg)  09/16/15 140 lb 11.2 oz (63.821 kg)   Oral cavity and oropharynx moist, without mucositis Skin with superficial, early moist peel in folds over neck  Impression:  The patient is tolerating radiotherapy.  Plan:  Continue radiotherapy as planned.       Neosporin for areas of peeling over skin  I have ordered F/u in 3-4 weeks - completes RT soon. ________________________________   Eppie Gibson, M.D.

## 2015-09-25 NOTE — Telephone Encounter (Signed)
  Oncology Nurse Navigator Documentation   Navigator Encounter Type: Telephone (09/25/15 1410)         Interventions: Other (09/25/15 1410)     Patient wife called with question re insurance coverage on recent bill.  I provided her phone number for Toniann Ket, [place] Specialist.   [name], RN, BSN, [place] at [place] [phone]           Time Spent with Patient: 15 (09/25/15 1410)

## 2015-09-25 NOTE — Progress Notes (Signed)
Mr. Mcqueary presents for his 31st fraction of radiation to his Larynx. He denies pain except tenderness and burning to the skin of his neck. He developed some open areas to his neck, which he is using neosporin for, and he states this has helped. He is also using the sonafine cream as directed.  The skin is red, hyperpigmented with dry skin visible but it does appear better than when I examined it yesterday. He reports he is swallowing well. He is eating mostly softer foods, and states he drinks a boost "once in a while". He doesn't like the boost because he says it tastes like chalk. He is drinking "a fair amount of liquids". His mouth is normal appearing except for some thick white saliva. I have encouraged him to use the baking soda/salt. rinse.   BP 147/58 mmHg  Pulse 67  Temp(Src) 97.7 F (36.5 C)  Ht 5\' 6"  (1.676 m)  Wt 140 lb 9.6 oz (63.776 kg)  BMI 22.70 kg/m2  Orthostatics: BP sitting 147/58, pulse 67. BP standing 153/61, pulse 72    Wt Readings from Last 3 Encounters:  09/25/15 140 lb 9.6 oz (63.776 kg)  09/20/15 138 lb 9.6 oz (62.869 kg)  09/16/15 140 lb 11.2 oz (63.821 kg)

## 2015-09-26 ENCOUNTER — Ambulatory Visit
Admission: RE | Admit: 2015-09-26 | Discharge: 2015-09-26 | Disposition: A | Discharge status: Home / Self Care | Payer: PPO | Source: Ambulatory Visit | Attending: Radiation Oncology | Admitting: Radiation Oncology

## 2015-09-27 ENCOUNTER — Ambulatory Visit
Admission: RE | Admit: 2015-09-27 | Discharge: 2015-09-27 | Disposition: A | Discharge status: Home / Self Care | Payer: PPO | Source: Ambulatory Visit | Attending: Radiation Oncology | Admitting: Radiation Oncology

## 2015-09-27 ENCOUNTER — Encounter: Payer: PPO | Admitting: Nutrition

## 2015-10-01 ENCOUNTER — Ambulatory Visit
Admission: RE | Admit: 2015-10-01 | Discharge: 2015-10-01 | Disposition: A | Discharge status: Home / Self Care | Payer: PPO | Source: Ambulatory Visit | Attending: Radiation Oncology | Admitting: Radiation Oncology

## 2015-10-01 ENCOUNTER — Encounter: Payer: Self-pay | Admitting: Radiation Oncology

## 2015-10-01 VITALS — BP 142/80 | HR 69 | Temp 97.7°F | Ht 66.0 in | Wt 138.3 lb

## 2015-10-01 DIAGNOSIS — C320 Malignant neoplasm of glottis: Secondary | ICD-10-CM

## 2015-10-01 DIAGNOSIS — Z510 Encounter for antineoplastic radiation therapy: Secondary | ICD-10-CM | POA: Diagnosis not present

## 2015-10-01 NOTE — Progress Notes (Signed)
Eric [name] is here for his 67/35 fraction to his Larynx (and also had one treatment to his Left Humerus and Right Humerus).  He admits to fatigue, and reports all he wants to do is sleep when he sits down. He does report he recently had a good nights sleep, "the best since his surgery". His neck is red, tender and with several dry areas that have improved since last week. He is still applying neosporin to areas which have recently healed. He is also using the sonafine cream twice daily. His mouth is clear, but he reports thickened saliva. He is using the sucralfate suspension before he eats and baking soda/salt rinse twice daily, and he states both these help him. He states he is eating "anything he wants without modifying his diet" and also states it is the same amount as before he was ill.  He drinks one boost daily to supplement his diet.   BP 142/80 mmHg  Pulse 69  Temp(Src) 97.7 F (36.5 C)  Ht 5\' 6"  (1.676 m)  Wt 138 lb 4.8 oz (62.732 kg)  BMI 22.33 kg/m2   Wt Readings from Last 3 Encounters:  10/01/15 138 lb 4.8 oz (62.732 kg)  09/25/15 140 lb 9.6 oz (63.776 kg)  09/20/15 138 lb 9.6 oz (62.869 kg)

## 2015-10-01 NOTE — Progress Notes (Signed)
  Radiation Oncology         (336) [phone] ________________________________  Name: Eric [name] MRN: [id number]  Date: 10/01/2015  DOB: Feb 28, 1930  Weekly Radiation Therapy Management    ICD-9-CM ICD-10-CM   1. 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 . . . . . . . The patient presents for routine under treatment assessment.                                   The patient is without complaint. He is having some fatigue. he notices some sensitivity to the skin along his neck area. Using Neosporin                                 Set-up films were reviewed.                                 The chart was checked. Physical Findings. . .  height is 5\' 6"  (1.676 m) and weight is 138 lb 4.8 oz (62.732 kg). His temperature is 97.7 F (36.5 C). His blood pressure is 142/80 and his pulse is 69. .    Oral cavity moist without secondary infection. The neck area shows erythema and dry desquamation but no moist desquamation at this time. Lungs are clear. The heart has regular rhythm and rate. Impression . . . . . . . The patient is tolerating radiation. Plan . . . . . . . . . . . . Continue treatment as planned.  ________________________________   Blair Promise, PhD, MD

## 2015-10-02 ENCOUNTER — Encounter: Payer: Self-pay | Admitting: *Deleted

## 2015-10-02 ENCOUNTER — Ambulatory Visit
Admission: RE | Admit: 2015-10-02 | Discharge: 2015-10-02 | Disposition: A | Discharge status: Home / Self Care | Payer: PPO | Source: Ambulatory Visit | Attending: Radiation Oncology | Admitting: Radiation Oncology

## 2015-10-02 ENCOUNTER — Encounter: Payer: Self-pay | Admitting: Radiation Oncology

## 2015-10-02 ENCOUNTER — Ambulatory Visit: Payer: PPO

## 2015-10-02 DIAGNOSIS — Z510 Encounter for antineoplastic radiation therapy: Secondary | ICD-10-CM | POA: Diagnosis not present

## 2015-10-02 NOTE — Progress Notes (Signed)
Met with patient during final RT to offer support and to celebrate end of radiation treatment.  He was accompanied by his wife. I provided wife with a Certificate of Recognition for her supportive care during her husband's treatment. I provided post-RT guidance:  Importance of keeping follow-up appts with Nutrition and SLP.  Importance of protecting treatment area from sun.  Continuation of Sonafine application 2-3 times daily. I explained that my role as navigator will continue for several more months and that I will be calling and/or joining him during follow-up visits.   I encouraged him to call me with needs/concerns.   Patient and wife verbalized understanding of information provided.  [name], RN, BSN, [place] at [place] [phone]

## 2015-10-03 ENCOUNTER — Ambulatory Visit: Payer: PPO

## 2015-10-04 ENCOUNTER — Ambulatory Visit
Admission: RE | Admit: 2015-10-04 | Discharge: 2015-10-04 | Disposition: A | Discharge status: Home / Self Care | Payer: PPO | Source: Ambulatory Visit | Attending: Geriatric Medicine | Admitting: Geriatric Medicine

## 2015-10-04 ENCOUNTER — Other Ambulatory Visit: Payer: Self-pay | Admitting: Geriatric Medicine

## 2015-10-04 DIAGNOSIS — J189 Pneumonia, unspecified organism: Secondary | ICD-10-CM

## 2015-10-13 ENCOUNTER — Emergency Department (HOSPITAL_COMMUNITY)
Admission: EM | Admit: 2015-10-13 | Discharge: 2015-10-13 | Disposition: A | Discharge status: Home / Self Care | Payer: PPO | Attending: Emergency Medicine | Admitting: Emergency Medicine

## 2015-10-13 ENCOUNTER — Emergency Department (HOSPITAL_COMMUNITY): Payer: PPO

## 2015-10-13 ENCOUNTER — Encounter (HOSPITAL_COMMUNITY): Payer: Self-pay | Admitting: Emergency Medicine

## 2015-10-13 DIAGNOSIS — Z8701 Personal history of pneumonia (recurrent): Secondary | ICD-10-CM | POA: Insufficient documentation

## 2015-10-13 DIAGNOSIS — Y998 Other external cause status: Secondary | ICD-10-CM | POA: Diagnosis not present

## 2015-10-13 DIAGNOSIS — Y93E1 Activity, personal bathing and showering: Secondary | ICD-10-CM | POA: Diagnosis not present

## 2015-10-13 DIAGNOSIS — F0390 Unspecified dementia without behavioral disturbance: Secondary | ICD-10-CM | POA: Insufficient documentation

## 2015-10-13 DIAGNOSIS — Z862 Personal history of diseases of the blood and blood-forming organs and certain disorders involving the immune mechanism: Secondary | ICD-10-CM | POA: Diagnosis not present

## 2015-10-13 DIAGNOSIS — Z8673 Personal history of transient ischemic attack (TIA), and cerebral infarction without residual deficits: Secondary | ICD-10-CM | POA: Insufficient documentation

## 2015-10-13 DIAGNOSIS — Z85828 Personal history of other malignant neoplasm of skin: Secondary | ICD-10-CM | POA: Diagnosis not present

## 2015-10-13 DIAGNOSIS — W19XXXA Unspecified fall, initial encounter: Secondary | ICD-10-CM

## 2015-10-13 DIAGNOSIS — S3992XA Unspecified injury of lower back, initial encounter: Secondary | ICD-10-CM | POA: Insufficient documentation

## 2015-10-13 DIAGNOSIS — Z7982 Long term (current) use of aspirin: Secondary | ICD-10-CM | POA: Insufficient documentation

## 2015-10-13 DIAGNOSIS — H9190 Unspecified hearing loss, unspecified ear: Secondary | ICD-10-CM | POA: Insufficient documentation

## 2015-10-13 DIAGNOSIS — Z87891 Personal history of nicotine dependence: Secondary | ICD-10-CM | POA: Diagnosis not present

## 2015-10-13 DIAGNOSIS — Y9289 Other specified places as the place of occurrence of the external cause: Secondary | ICD-10-CM | POA: Diagnosis not present

## 2015-10-13 DIAGNOSIS — K219 Gastro-esophageal reflux disease without esophagitis: Secondary | ICD-10-CM | POA: Insufficient documentation

## 2015-10-13 DIAGNOSIS — Z79899 Other long term (current) drug therapy: Secondary | ICD-10-CM | POA: Diagnosis not present

## 2015-10-13 DIAGNOSIS — Z85038 Personal history of other malignant neoplasm of large intestine: Secondary | ICD-10-CM | POA: Insufficient documentation

## 2015-10-13 DIAGNOSIS — M545 Low back pain, unspecified: Secondary | ICD-10-CM

## 2015-10-13 DIAGNOSIS — I10 Essential (primary) hypertension: Secondary | ICD-10-CM | POA: Insufficient documentation

## 2015-10-13 DIAGNOSIS — G2581 Restless legs syndrome: Secondary | ICD-10-CM | POA: Insufficient documentation

## 2015-10-13 DIAGNOSIS — M1990 Unspecified osteoarthritis, unspecified site: Secondary | ICD-10-CM | POA: Insufficient documentation

## 2015-10-13 DIAGNOSIS — Z8619 Personal history of other infectious and parasitic diseases: Secondary | ICD-10-CM | POA: Insufficient documentation

## 2015-10-13 DIAGNOSIS — I2510 Atherosclerotic heart disease of native coronary artery without angina pectoris: Secondary | ICD-10-CM | POA: Insufficient documentation

## 2015-10-13 DIAGNOSIS — Z8639 Personal history of other endocrine, nutritional and metabolic disease: Secondary | ICD-10-CM | POA: Diagnosis not present

## 2015-10-13 DIAGNOSIS — H409 Unspecified glaucoma: Secondary | ICD-10-CM | POA: Insufficient documentation

## 2015-10-13 DIAGNOSIS — W010XXA Fall on same level from slipping, tripping and stumbling without subsequent striking against object, initial encounter: Secondary | ICD-10-CM | POA: Insufficient documentation

## 2015-10-13 MED ORDER — HYDROCODONE-ACETAMINOPHEN 5-325 MG PO TABS
1.0000 | ORAL_TABLET | Freq: Once | ORAL | Status: AC
  Administered 2015-10-13: 1 via ORAL
  Filled 2015-10-13: qty 1

## 2015-10-13 NOTE — Discharge Instructions (Signed)
Return without fail for worsening symptoms, including worsening pain, inability or difficulty walking, numbness or weakness in legs, abdominal pain, or any other symptoms concerning to you.  Back Pain, Adult Back pain is very common in adults.The cause of back pain is rarely dangerous and the pain often gets better over time.The cause of your back pain may not be known. Some common causes of back pain include:  Strain of the muscles or ligaments supporting the spine.  Wear and tear (degeneration) of the spinal disks.  Arthritis.  Direct injury to the back. For many people, back pain may return. Since back pain is rarely dangerous, most people can learn to manage this condition on their own. HOME CARE INSTRUCTIONS Watch your back pain for any changes. The following actions may help to lessen any discomfort you are feeling:  Remain active. It is stressful on your back to sit or stand in one place for long periods of time. Do not sit, drive, or stand in one place for more than 30 minutes at a time. Take short walks on even surfaces as soon as you are able.Try to increase the length of time you walk each day.  Exercise regularly as directed by your health care provider. Exercise helps your back heal faster. It also helps avoid future injury by keeping your muscles strong and flexible.  Do not stay in bed.Resting more than 1-2 days can delay your recovery.  Pay attention to your body when you bend and lift. The most comfortable positions are those that put less stress on your recovering back. Always use proper lifting techniques, including:  Bending your knees.  Keeping the load close to your body.  Avoiding twisting.  Find a comfortable position to sleep. Use a firm mattress and lie on your side with your knees slightly bent. If you lie on your back, put a pillow under your knees.  Avoid feeling anxious or stressed.Stress increases muscle tension and can worsen back pain.It is  important to recognize when you are anxious or stressed and learn ways to manage it, such as with exercise.  Take medicines only as directed by your health care provider. Over-the-counter medicines to reduce pain and inflammation are often the most helpful.Your health care provider may prescribe muscle relaxant drugs.These medicines help dull your pain so you can more quickly return to your normal activities and healthy exercise.  Apply ice to the injured area:  Put ice in a plastic bag.  Place a towel between your skin and the bag.  Leave the ice on for 20 minutes, 2-3 times a day for the first 2-3 days. After that, ice and heat may be alternated to reduce pain and spasms.  Maintain a healthy weight. Excess weight puts extra stress on your back and makes it difficult to maintain good posture. SEEK MEDICAL CARE IF:  You have pain that is not relieved with rest or medicine.  You have increasing pain going down into the legs or buttocks.  You have pain that does not improve in one week.  You have night pain.  You lose weight.  You have a fever or chills. SEEK IMMEDIATE MEDICAL CARE IF:   You develop new bowel or bladder control problems.  You have unusual weakness or numbness in your arms or legs.  You develop nausea or vomiting.  You develop abdominal pain.  You feel faint.   This information is not intended to replace advice given to you by your health care provider. Make sure  you discuss any questions you have with your health care provider. °  °Document Released: 09/14/2005 Document Revised: 10/05/2014 Document Reviewed: 01/16/2014 °Elsevier Interactive Patient Education ©2016 [employer] Inc. ° °

## 2015-10-13 NOTE — ED Notes (Signed)
Pt assisted to BR with steady gait. Pt denies pain with ambulation.

## 2015-10-13 NOTE — ED Notes (Signed)
Pt arrived via EMS with report of lower back and bil hip pain s/p to shoveling snow last week. Pt reported that this morning he slipped and fell in the bathroom while taking a shower. Pt denies neck/upper back injury/pain, LOC, hitting head, dizziness/lightheadedness,visual disturbances, or nausea.

## 2015-10-13 NOTE — Progress Notes (Signed)
  Radiation Oncology         (336) [phone] ________________________________  Name: Eric [name] MRN: [id number]  Date: 10/02/2015  DOB: [birth year]  End of Treatment Note  Diagnosis:     Carcinoma, glottis ([place])   Staging form: Larynx - Glottis, AJCC 7th Edition     Clinical: Stage IVA (T4a, N0, M0)   Also - Multiple Myeloma  Indication for treatment:  Definitive local control to the Larynx; Palliative for bone disease from myeloma      Radiation treatment dates:   08/12/2015-10/02/2015  Site/dose:   1) Larynx and bilateral neck / 70 Gy in 35 fractions 2) Right humerus / 8 Gy in 1 fraction 3) Left humerus / 8 Gy in 1 fraction  Beams/energy:  1) IMRT-helical / 6X 2) AP/PA / 10X and 6X 3) AP/PA / 10X and 6X  Narrative: The patient tolerated radiation treatment relatively well.      Plan: The patient has completed radiation treatment. The patient will return to radiation oncology clinic for routine followup in one month. I advised them to call or return sooner if they have any questions or concerns related to their recovery or treatment.  Addendum: patient's family called to cancel 1 month followup, informing [name], RN, our Head and Neck Oncology Navigator, that Mr. Joung had enrolled on hospice.  -----------------------------------  Eppie Gibson, MD

## 2015-10-13 NOTE — ED Provider Notes (Signed)
CSN: [id number]     Arrival date & time 10/13/15  1205 History   First MD Initiated Contact with Patient 10/13/15 1306     Chief Complaint  Patient presents with  . Fall     (Consider location/radiation/quality/duration/timing/severity/associated sxs/prior Treatment) HPI  82 year old male who presents after fall. No blood thinners, with history of PVD, CAD, HTN, HLD. Three days ago, was shoveling snow and also tried to pull his wife up from a fall on ice, when he felt a pull in his low back. Taken over the counter medications, and today in the shower slipped and fell onto his back. Wife found him in the bathtub and heard his fall from the adjacent room. States initially seemed confused but quickly to baseline. He states he slipped and did not have syncope, but was unsure if he hit his head or had LOC. Notes pain in his low back, and mild neck pain. No chest pain, sob, abd pain, numbness or weakness.  Past Medical History  Diagnosis Date  . Peripheral vascular disease ([place])   . Transient global amnesia   . Hypercholesterolemia   . 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 Hypertension   . Macrocytic anemia   . Glaucoma   . Osteoporosis   . GERD (gastroesophageal reflux disease)   . Cholelithiasis   . Shingles 2009  . Pneumonia 02-2011    left lower lobe  . CAD (coronary artery disease)   . Colon cancer ([place])     Multiple Mylemona  . Stroke [facility])     'Mini Stroke"  . Dementia   . Skin cancer     ears  . Peripheral arterial disease ([place])   . Shortness of breath dyspnea     with exertion  . Arthritis   . Restless leg syndrome   . 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 Hearing loss    Past Surgical History  Procedure Laterality Date  . Laminectomies      multiple  . Exploratory laparotomy      treat twisted intestines  . Colectomy      to remove adenoma  . Femoral artery stenting    . Back surgery      lower back  . Endarterectomy femoral  Left 05/02/2014    Procedure: LEFT FEMORAL ENDARTERECTOMY ;  Surgeon: Rosetta Posner, MD;  Location: [place];  Service: Vascular;  Laterality: Left;  . Abdominal aortagram N/A 03/28/2014    Procedure: ABDOMINAL AORTAGRAM;  Surgeon: Rosetta Posner, MD;  Location: [facility] CATH LAB;  Service: Cardiovascular;  Laterality: N/A;  . Eye surgery Left     cataract extraction  . Microlaryngoscopy N/A 05/28/2015    Procedure: MICROLARYNGOSCOPY/ WITH BIOPSY;  Surgeon: Beverly Gust, MD;  Location: [facility] ORS;  Service: ENT;  Laterality: N/A;   Family History  Problem Relation Age of Onset  . Diabetes Mother   . Heart disease Mother   . Hypertension Mother   . Heart disease Father   . Hypertension Father   . Diabetes Father   . Hyperlipidemia Father   . Peripheral vascular disease Father   . Cancer Brother   . Diabetes Brother   . Hypertension Brother   . Heart disease Brother     before age 83  . Cancer Sister    Social History  Substance Use Topics  . Smoking status: Former Smoker    Types: Cigarettes    Quit date: 09/28/1988  .  Smokeless tobacco: Current User    Types: Chew  . Alcohol Use: No     Comment: rare    Review of Systems 10/14 systems reviewed and are negative other than those stated in the HPI    Allergies  Review of patient's allergies indicates no known allergies.  Home Medications   Prior to Admission medications   Medication Sig Start Date End Date Taking? Authorizing Provider  aspirin EC 81 MG tablet Take 81 mg by mouth at bedtime.    Yes Historical Provider, MD  clonazePAM (KLONOPIN) 0.5 MG tablet Take 0.5 mg by mouth at bedtime.    Yes Historical Provider, MD  esomeprazole (NEXIUM 24HR) 20 MG capsule Take 20 mg by mouth every morning.   Yes Historical Provider, MD  hydrochlorothiazide (HYDRODIURIL) 25 MG tablet Take 25 mg by mouth daily.   Yes Historical Provider, MD  latanoprost (XALATAN) 0.005 % ophthalmic solution Place 1 drop into both eyes at bedtime.  03/09/14  Yes  Historical Provider, MD  lidocaine (XYLOCAINE) 2 % solution Patient: Mix 1part 2% viscous lidocaine, 1part H20. Swish and/or swallow 44mL of this mixture, 45min before meals and at bedtime, up to QID 08/19/15  Yes Eppie Gibson, MD  lisinopril (PRINIVIL,ZESTRIL) 5 MG tablet Take 5 mg by mouth at bedtime.    Yes Historical Provider, MD  pregabalin (LYRICA) 75 MG capsule Take 75 mg by mouth daily. Reported on 10/01/2015   Yes Historical Provider, MD  sucralfate (CARAFATE) 1 GM/10ML suspension Take 10 mLs (1 g total) by mouth 4 (four) times daily -  with meals and at bedtime. 09/16/15  Yes Eppie Gibson, MD  Timolol Maleate PF 0.5 % SOLN Place 1 drop into both eyes every morning.    Yes Historical Provider, MD  traMADol (ULTRAM) 50 MG tablet Take 1 tablet (50 mg total) by mouth every 6 (six) hours as needed. 05/03/14  Yes Samantha J Rhyne, PA-C  HYDROcodone-acetaminophen (NORCO) 5-325 MG tablet Take 1 tablet by mouth every 4 (four) hours as needed. Patient not taking: Reported on 09/02/2015 08/24/15   Daleen Bo, MD   BP 147/73 mmHg  Pulse 81  Temp(Src) 98.4 F (36.9 C)  Resp 20  SpO2 95% Physical Exam  Physical Exam  Nursing note and vitals reviewed. Constitutional: Well developed elderly appearing man, well nourished, non-toxic, and in no acute distress Head: Normocephalic and atraumatic.  Mouth/Throat: Oropharynx is clear and moist.  Neck: Normal range of motion. Neck supple. Mild left paraspinal neck tenderness Cardiovascular: Normal rate and regular rhythm.   Pulmonary/Chest: Effort normal and breath sounds normal. No chest wall tenderness Abdominal: Soft. There is no tenderness. There is no rebound and no guarding.  Musculoskeletal: Normal range of motion. No midline TLS spine tenderness, left paraspinal back tenderness to palpation of the lumbar spine Neurological: Alert, no facial droop, fluent speech, moves all extremities symmetrically, sensation to light touch in tact bilaterally,  normal steady non-ataxic gait Skin: Skin is warm and dry.  Psychiatric: Cooperative   ED Course  Procedures (including critical care time) Labs Review Labs Reviewed - No data to display  Imaging Review Dg Lumbar Spine Complete  10/13/2015  CLINICAL DATA:  Low back and bilateral hip pain since shoveling snow last week. History of lumbar laminectomy and head and neck cancer. EXAM: LUMBAR SPINE - COMPLETE 4+ VIEW COMPARISON:  PET-CT 06/07/2015.  Radiographs 03/16/2014. FINDINGS: There are 5 lumbar type vertebral bodies. The alignment is normal. There are postsurgical changes at L4-5 with solid interbody  fusion. There is multilevel degenerative disc disease with endplate sclerosis most advanced at L3-4 and L5-S1. No evidence of acute fracture or pars defect. There are diffuse vascular calcifications. Iliac stents are present bilaterally. IMPRESSION: No evidence of acute lumbar spine injury.  Diffuse spondylosis. Electronically Signed   By: Richardean Sale M.D.   On: 10/13/2015 14:25   Ct Head Wo Contrast  10/13/2015  CLINICAL DATA:  Slipped and fell in bathroom this morning while taking a shower. EXAM: CT HEAD WITHOUT CONTRAST CT CERVICAL SPINE WITHOUT CONTRAST TECHNIQUE: Multidetector CT imaging of the head and cervical spine was performed following the standard protocol without intravenous contrast. Multiplanar CT image reconstructions of the cervical spine were also generated. COMPARISON:  MRI brain 07/12/2015 and CT soft tissue neck 07/05/2015 FINDINGS: CT HEAD FINDINGS Ventricles, cisterns and other CSF spaces are within normal. There is mild chronic ischemic microvascular disease. There is no mass, mass effect, shift of midline structures or acute hemorrhage. Prominent perivascular spaces just below the lentiform nucleus bilaterally. No evidence of acute infarction. Remaining bones and soft tissues are within normal. CT CERVICAL SPINE FINDINGS Vertebral body alignment and heights are within normal  there is moderate spondylosis of the cervical spine. There is moderate disc space narrowing at the C2-3, C3-4 and C5-6 levels unchanged. Prevertebral soft tissues are within normal. There is moderate uncovertebral joint spurring and mild facet arthropathy. There is no acute fracture or traumatic subluxation. Right paracentral disc osteophyte complex over the C4-5 level and possible central focal disc protrusion at the C5-6 level unchanged. Emphysematous changes over the lung apices. Mild nodularity over the posterior right apex not well seen on the previous exams and may be related to radiation treatment for unknown head neck cancer. IMPRESSION: No acute intracranial findings. Chronic ischemic microvascular disease. No acute cervical spine injury. Moderate spondylosis of the cervical spinal multilevel disc disease unchanged. Possible disc herniations at the C4-5 and C5-6 levels unchanged. Emphysematous disease over the lung apices. Mild nodularity over the posterior right apex not well seen on the prior exams. This may be related to previous radiation treatment for known history of head neck cancer. Consider chest CT on elective basis for further evaluation. Electronically Signed   By: Marin Olp M.D.   On: 10/13/2015 14:31   Ct Cervical Spine Wo Contrast  10/13/2015  CLINICAL DATA:  Slipped and fell in bathroom this morning while taking a shower. EXAM: CT HEAD WITHOUT CONTRAST CT CERVICAL SPINE WITHOUT CONTRAST TECHNIQUE: Multidetector CT imaging of the head and cervical spine was performed following the standard protocol without intravenous contrast. Multiplanar CT image reconstructions of the cervical spine were also generated. COMPARISON:  MRI brain 07/12/2015 and CT soft tissue neck 07/05/2015 FINDINGS: CT HEAD FINDINGS Ventricles, cisterns and other CSF spaces are within normal. There is mild chronic ischemic microvascular disease. There is no mass, mass effect, shift of midline structures or acute  hemorrhage. Prominent perivascular spaces just below the lentiform nucleus bilaterally. No evidence of acute infarction. Remaining bones and soft tissues are within normal. CT CERVICAL SPINE FINDINGS Vertebral body alignment and heights are within normal there is moderate spondylosis of the cervical spine. There is moderate disc space narrowing at the C2-3, C3-4 and C5-6 levels unchanged. Prevertebral soft tissues are within normal. There is moderate uncovertebral joint spurring and mild facet arthropathy. There is no acute fracture or traumatic subluxation. Right paracentral disc osteophyte complex over the C4-5 level and possible central focal disc protrusion at the C5-6 level unchanged.  Emphysematous changes over the lung apices. Mild nodularity over the posterior right apex not well seen on the previous exams and may be related to radiation treatment for unknown head neck cancer. IMPRESSION: No acute intracranial findings. Chronic ischemic microvascular disease. No acute cervical spine injury. Moderate spondylosis of the cervical spinal multilevel disc disease unchanged. Possible disc herniations at the C4-5 and C5-6 levels unchanged. Emphysematous disease over the lung apices. Mild nodularity over the posterior right apex not well seen on the prior exams. This may be related to previous radiation treatment for known history of head neck cancer. Consider chest CT on elective basis for further evaluation. Electronically Signed   By: Marin Olp M.D.   On: 10/13/2015 14:31   Dg Hips Bilat With Pelvis 3-4 Views  10/13/2015  CLINICAL DATA:  Pain after shoveling snow.  Fall earlier today EXAM: DG HIP (WITH OR WITHOUT PELVIS) 4+V BILAT COMPARISON:  None. FINDINGS: Frontal pelvis as well as frontal and lateral hip images bilaterally-total five views- were obtained. There is no demonstrable fracture or dislocation. There is slight symmetric narrowing of both hip joints. No erosive change. There are foci of arterial  vascular calcification bilaterally. There are stents in each common iliac artery. IMPRESSION: Slight symmetric narrowing of both hip joints. No fracture or dislocation. Electronically Signed   By: Lowella Grip III M.D.   On: 10/13/2015 14:25   I have personally reviewed and evaluated these images and lab results as part of my medical decision-making.   EKG Interpretation None      MDM   Final diagnoses:  Fall  Left-sided low back pain without sciatica    82 year old mail who presents with back pain after fall. Is not anticoagulated. Is well appearing and stable VS. Neuro in tact with left paraspinal low back tenderness. Remainder of exam unremarkable. CT head and cervical spine performed, visualized, and without acute processes. XRay of his lumbar spine also without acute processes, and pain well controlled in ED. He is ambulatory. Low suspicion for serious injury at this time, but strict return instructions are reviewed with patient and his wife. Follow-up instructions reviewed. He expressed understanding of all discharge instructions and felt comfortable with the plan of care.     Forde Dandy, MD 10/14/15 1250

## 2015-10-13 NOTE — ED Notes (Addendum)
Pt taken to CT/ Xray.

## 2015-10-13 NOTE — ED Notes (Signed)
Awake. Verbally responsive. A/O x4. Resp even and unlabored. No audible adventitious breath sounds noted. ABC's intact. Spouse at bedside dressing pt. Explained to spouse take waiting on test results. Spouse reported that they did not want to wait much longer. Dr. Viviana Simpler aware and plans to d/c pt.

## 2015-10-13 NOTE — ED Notes (Signed)
Awake. Verbally responsive. A/O x4. Resp even and unlabored. No audible adventitious breath sounds noted. ABC's intact.  

## 2015-10-13 NOTE — ED Notes (Signed)
Bed: [facility] Expected date: 10/13/15 Expected time: 12:01 PM Means of arrival: Ambulance Comments: Fall, pain Hip and back pain

## 2015-10-15 ENCOUNTER — Other Ambulatory Visit: Payer: Self-pay | Admitting: Internal Medicine

## 2015-10-15 ENCOUNTER — Ambulatory Visit
Admission: RE | Admit: 2015-10-15 | Discharge: 2015-10-15 | Disposition: A | Discharge status: Home / Self Care | Payer: PPO | Source: Ambulatory Visit | Attending: Internal Medicine | Admitting: Internal Medicine

## 2015-10-15 DIAGNOSIS — R05 Cough: Secondary | ICD-10-CM

## 2015-10-15 DIAGNOSIS — R059 Cough, unspecified: Secondary | ICD-10-CM

## 2015-10-21 ENCOUNTER — Ambulatory Visit: Payer: PPO

## 2015-10-25 ENCOUNTER — Other Ambulatory Visit: Payer: Self-pay

## 2015-10-25 NOTE — Patient Outreach (Signed)
[facility]) Care Management  10/25/2015  Sayyid Trnka 1930/05/21 [id number]   SUBJECTIVE:  Telephone call to patient regarding [facility] referral. HIPAA verified by wife Jermale Labrum) /daughter(Pamela Evelina Bucy) for patient. Wife states patient is unable to talk for himself. .  Discussed and offered [facility] care management services. Daughter states patient is now under Hospice care.  Refused [facility] care management services.   PLAN:  RNCM will refer patient to Damita Rhodie to close due to patient being enrolled in hospice program. RNCM will notify patients primary MD of refusal of [facility] care management services due to being enrolled in hospice program.   Quinn Plowman RN,BSN,CCM [facility] Telephonic  [phone]

## 2015-10-30 ENCOUNTER — Inpatient Hospital Stay: Admission: RE | Admit: 2015-10-30 | Payer: PPO | Source: Ambulatory Visit | Admitting: Radiation Oncology

## 2015-10-30 ENCOUNTER — Ambulatory Visit: Admission: RE | Admit: 2015-10-30 | Payer: PPO | Source: Ambulatory Visit | Admitting: Radiation Oncology

## 2015-10-31 ENCOUNTER — Telehealth: Payer: Self-pay | Admitting: Internal Medicine

## 2015-10-31 ENCOUNTER — Telehealth: Payer: Self-pay | Admitting: *Deleted

## 2015-10-31 NOTE — Telephone Encounter (Signed)
  Oncology Nurse Navigator Documentation  Navigator Location: [facility] Onc (10/31/15 1441) Navigator Encounter Type: Telephone (10/31/15 1441) Telephone: Patient Update (10/31/15 1441)         Patient Visit Type: Other (10/31/15 1441)       Received call from Eric [name]'s dtr [name] informing of her dad's death yesterday.  He died at home peacefully under hospice care.  I expressed condolences on behalf of the Care Team.   [name], RN, BSN, [place] at [place] [phone]                             Time Spent with Patient: 15 (10/31/15 1441)

## 2015-10-31 NOTE — Telephone Encounter (Signed)
Daughter called in to cancel march appts and to advise that he passed away 11/18/15

## 2015-11-26 ENCOUNTER — Encounter (HOSPITAL_COMMUNITY): Payer: PPO

## 2015-11-26 ENCOUNTER — Ambulatory Visit: Payer: PPO | Admitting: Family

## 2015-11-27 DEATH — deceased

## 2015-12-17 ENCOUNTER — Other Ambulatory Visit: Payer: PPO

## 2015-12-18 ENCOUNTER — Encounter: Payer: Self-pay | Admitting: *Deleted

## 2015-12-18 NOTE — Progress Notes (Signed)
  Oncology Nurse Navigator Documentation  Navigator Location: [facility] Onc (12/18/15 1519)     Abnormal Finding Date: 05/28/15 (12/18/15 1519) Confirmed Diagnosis Date: 06/07/15 (12/18/15 1519)   Treatment Initiated Date: 08/12/15 (12/18/15 1519)       Patient record update.  [name], RN, BSN, [place] at [place] [phone]                             Time Spent with Patient: 15 (12/18/15 1519)

## 2015-12-20 NOTE — Therapy (Signed)
[place] [address] [place], [place], [address] Phone: [phone]   Fax:  [phone]  Patient Details  Name: Eric [name] MRN: [id number] Date of Birth: 09/04/1930 Referring Provider:  No ref. provider found  Encounter Date: 12/20/2015  Pt was seen for evaluation on 08-26-15, and due to a fast decline in medical condition was not seen back by this clinician prior to expiring.  His therapy course will formally be discharged at this time.   [facility] ,[place], [place]  12/20/2015, 10:50 AM  [place] [address] [place], [place], [address] Phone: [phone]   Fax:  [phone]

## 2015-12-24 ENCOUNTER — Ambulatory Visit: Payer: PPO | Admitting: Internal Medicine

## 2016-04-08 IMAGING — CR DG BONE SURVEY MET
9 of 10 series · 9 of 10 positions shown · non-contrast
Comparison: Multiple exams, including 04/12/2006

CLINICAL DATA: Monoclonal gammopathy of unknown significance,
question multiple myeloma. Bilateral leg pain. Remote history of
colon cancer.

EXAM:
METASTATIC BONE SURVEY, 24 views

[w chest pa]
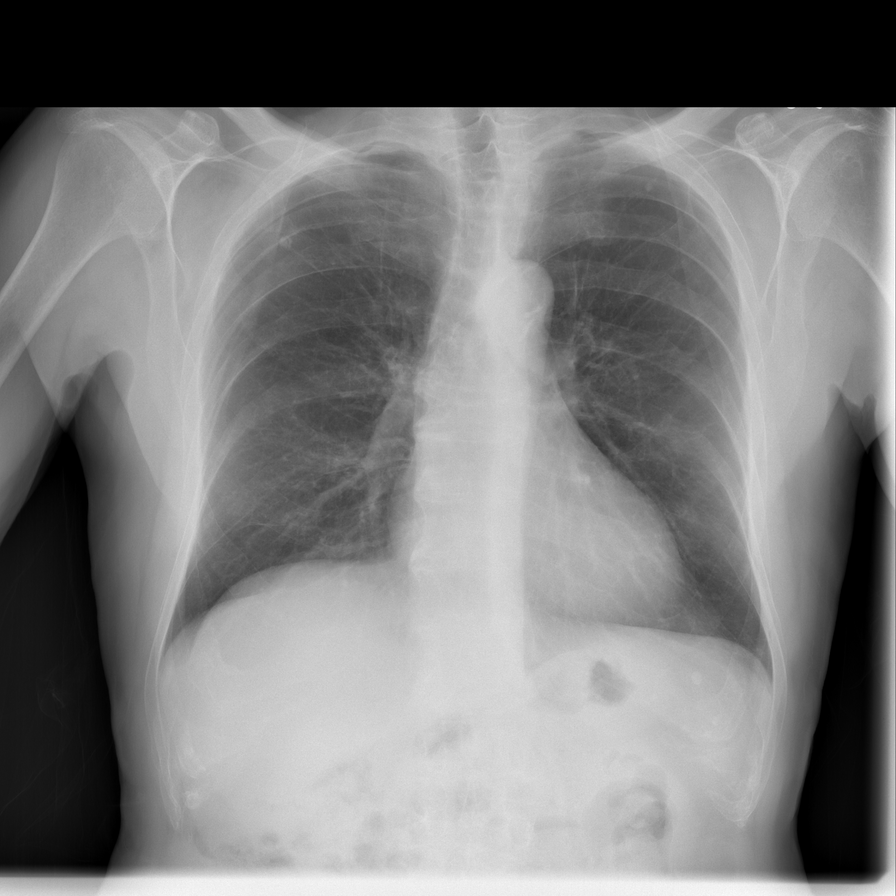

[w c-spine lat]
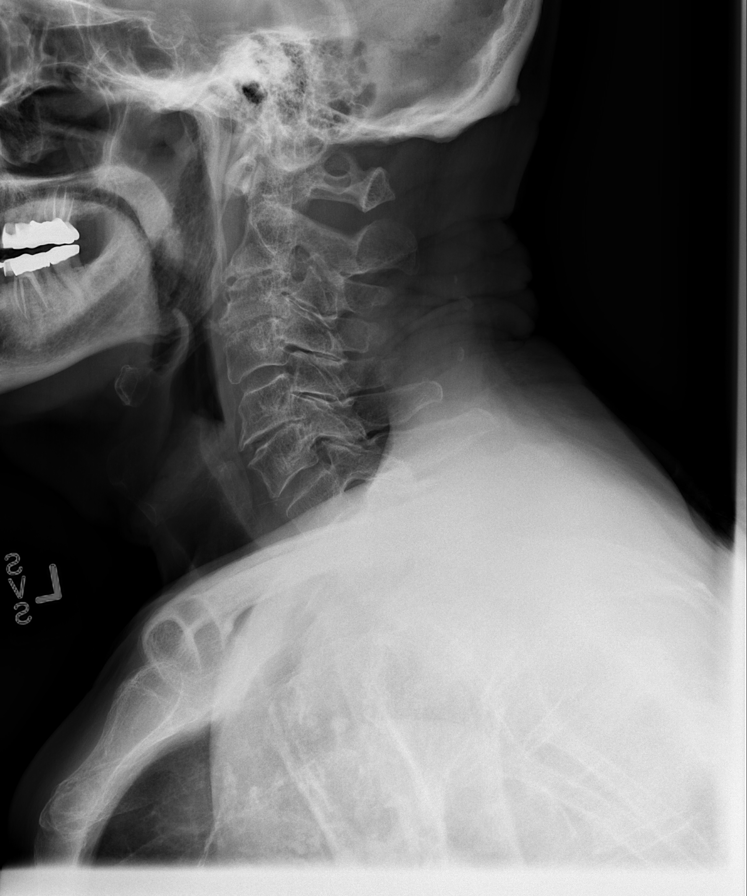

[w skull lat *]
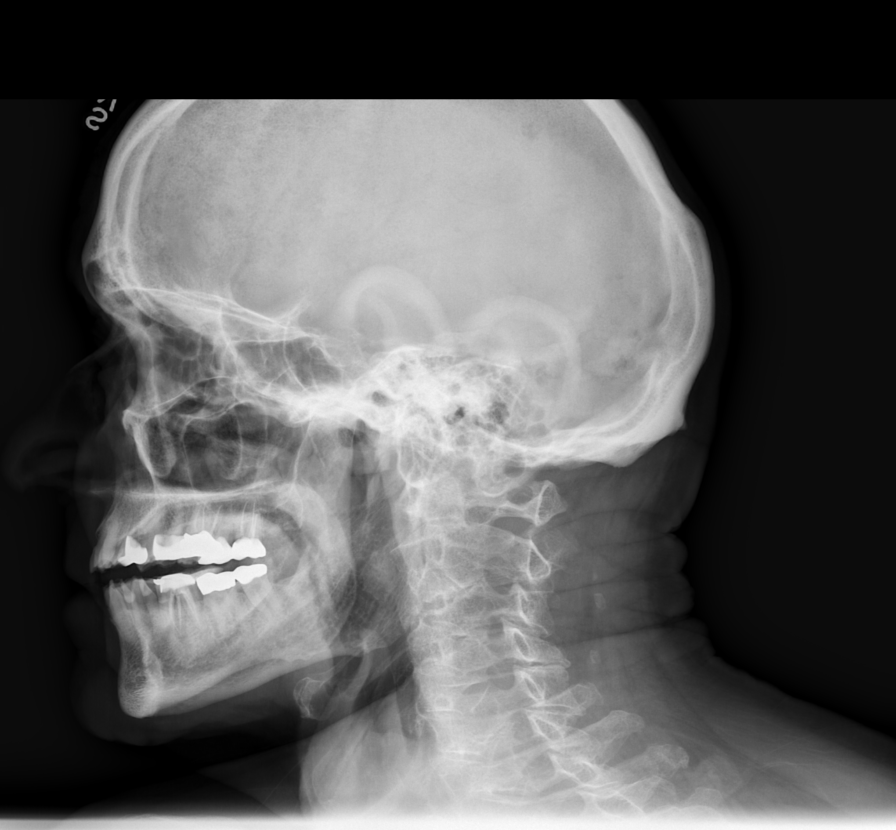

[t c-spine a.p.]
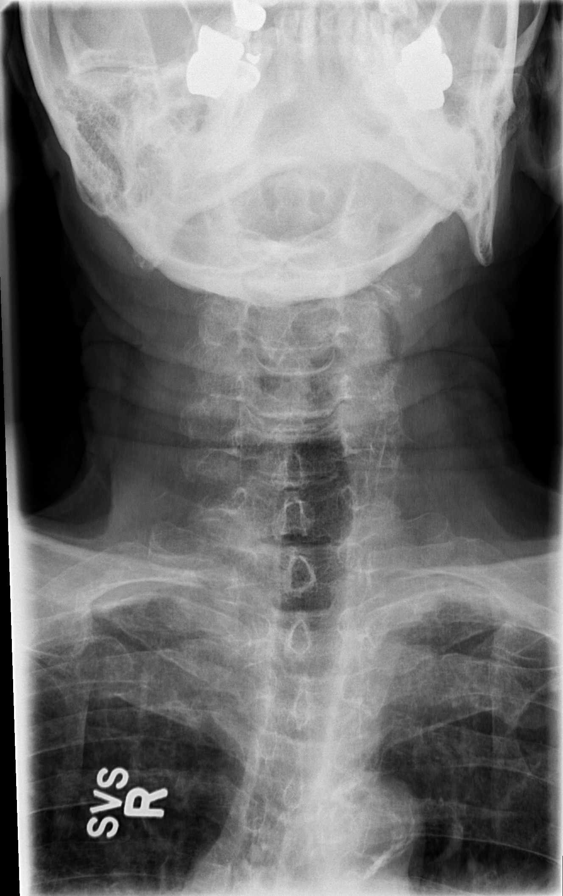

[t t-spine a.p.]
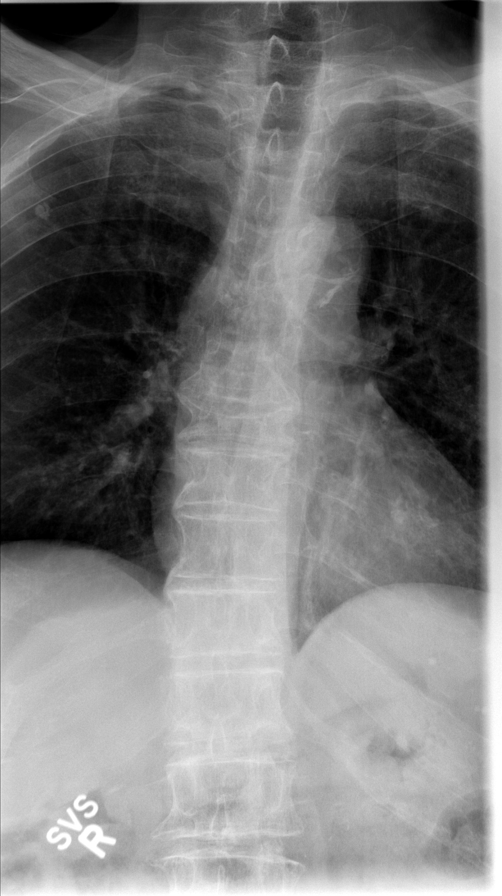

[t l-spine a.p.]
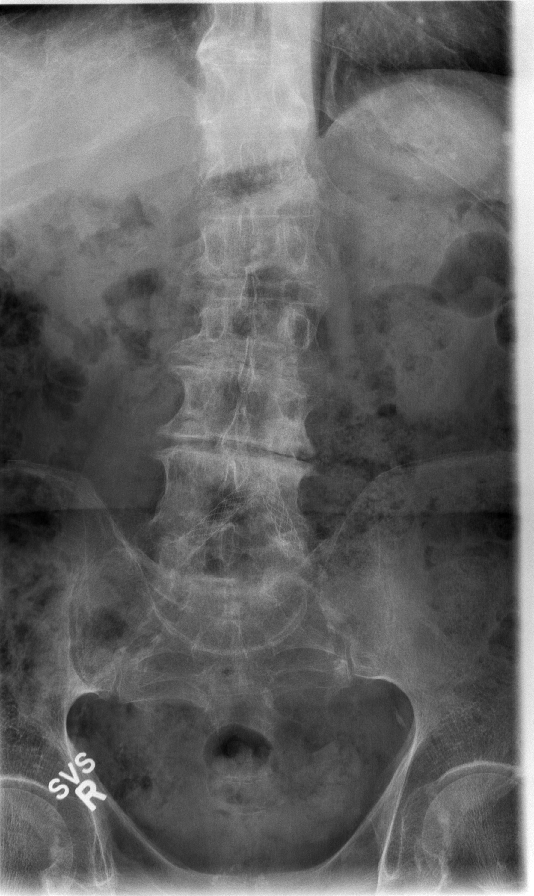

[t pelvis a.p.]
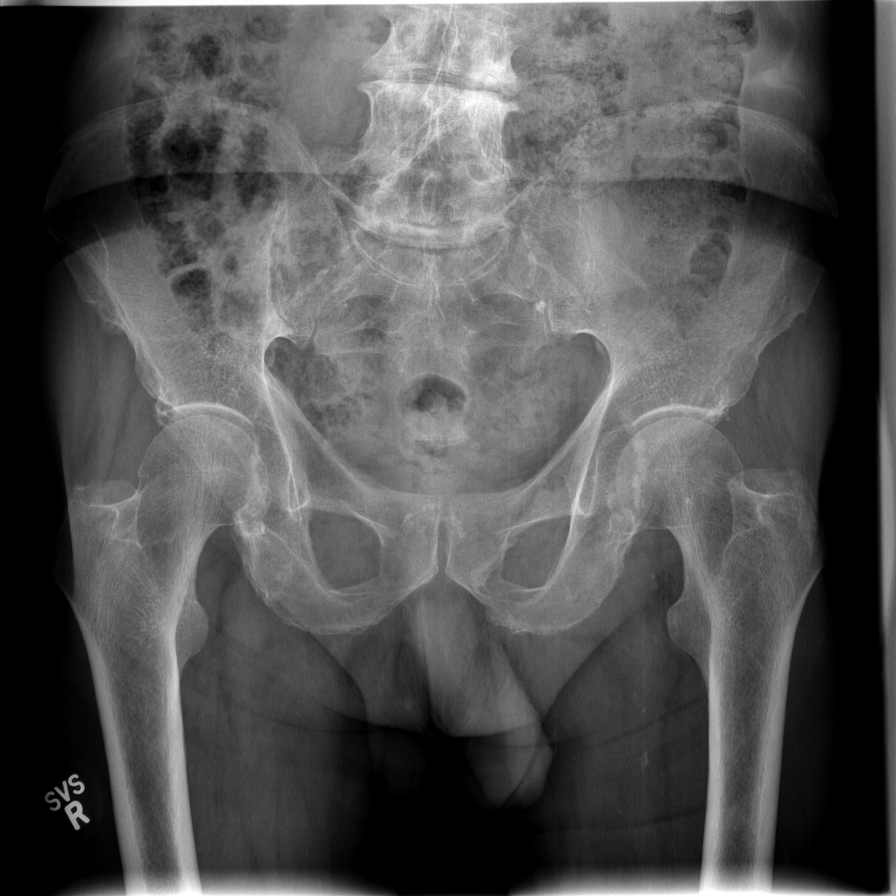

[t femur with hip  ap left]
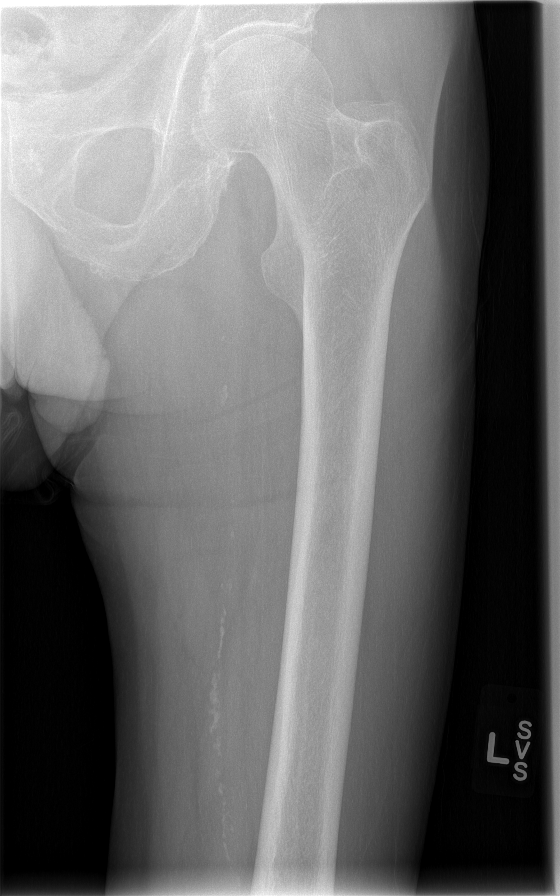

[t femur with knee ap left]
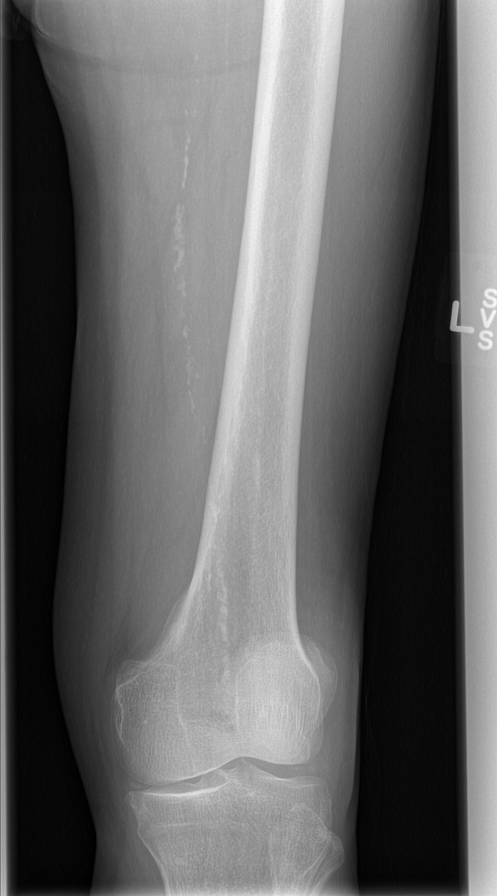

[9 of 10 positions shown; findings below may reference images not displayed]

FINDINGS: Chronic T7 and T8 compression fractures. Thoracic spondylosis and
aortic atherosclerosis. Old granulomatous disease. Common iliac
artery stents. Lumbar spondylosis and degenerative disc disease.
Poor definition of the L4-5 intervertebral disc space, query
interbody fusion.

Meniscal chondrocalcinosis in the knees. There are some subtle
lucencies in the skull but I fine these more characteristic of
arachnoid granulations or similar benign structures rather than
myeloma.
IMPRESSION: 1. No findings of myeloma.

## 2016-05-20 NOTE — Telephone Encounter (Signed)
error
